# Patient Record
Sex: Female | Born: 1940 | Race: White | Hispanic: No | Smoking: Never smoker
Health system: Southern US, Community
[De-identification: ages and names within clinical notes are randomized; demographics above are authoritative.]

## PROBLEM LIST (undated history)

## (undated) DIAGNOSIS — F419 Anxiety disorder, unspecified: Secondary | ICD-10-CM

## (undated) DIAGNOSIS — C50919 Malignant neoplasm of unspecified site of unspecified female breast: Secondary | ICD-10-CM

## (undated) DIAGNOSIS — C50411 Malignant neoplasm of upper-outer quadrant of right female breast: Secondary | ICD-10-CM

## (undated) DIAGNOSIS — I10 Essential (primary) hypertension: Secondary | ICD-10-CM

## (undated) DIAGNOSIS — Z923 Personal history of irradiation: Secondary | ICD-10-CM

## (undated) DIAGNOSIS — E039 Hypothyroidism, unspecified: Secondary | ICD-10-CM

## (undated) DIAGNOSIS — IMO0001 Reserved for inherently not codable concepts without codable children: Secondary | ICD-10-CM

## (undated) DIAGNOSIS — Z8489 Family history of other specified conditions: Secondary | ICD-10-CM

## (undated) DIAGNOSIS — J45909 Unspecified asthma, uncomplicated: Secondary | ICD-10-CM

## (undated) DIAGNOSIS — IMO0002 Reserved for concepts with insufficient information to code with codable children: Secondary | ICD-10-CM

## (undated) DIAGNOSIS — M1990 Unspecified osteoarthritis, unspecified site: Secondary | ICD-10-CM

## (undated) DIAGNOSIS — T7840XA Allergy, unspecified, initial encounter: Secondary | ICD-10-CM

## (undated) HISTORY — PX: EYE SURGERY: SHX253

## (undated) HISTORY — DX: Malignant neoplasm of unspecified site of unspecified female breast: C50.919

## (undated) HISTORY — DX: Reserved for concepts with insufficient information to code with codable children: IMO0002

## (undated) HISTORY — DX: Reserved for inherently not codable concepts without codable children: IMO0001

## (undated) HISTORY — DX: Malignant neoplasm of upper-outer quadrant of right female breast: C50.411

---

## 1991-12-13 HISTORY — PX: BREAST EXCISIONAL BIOPSY: SUR124

## 2001-11-05 ENCOUNTER — Other Ambulatory Visit: Admission: RE | Admit: 2001-11-05 | Discharge: 2001-11-05 | Payer: Self-pay | Admitting: Obstetrics and Gynecology

## 2002-11-14 ENCOUNTER — Other Ambulatory Visit: Admission: RE | Admit: 2002-11-14 | Discharge: 2002-11-14 | Payer: Self-pay | Admitting: Obstetrics and Gynecology

## 2003-07-01 ENCOUNTER — Encounter: Admission: RE | Admit: 2003-07-01 | Discharge: 2003-07-01 | Payer: Self-pay | Admitting: Obstetrics and Gynecology

## 2003-07-01 ENCOUNTER — Encounter: Payer: Self-pay | Admitting: Obstetrics and Gynecology

## 2003-12-01 ENCOUNTER — Other Ambulatory Visit: Admission: RE | Admit: 2003-12-01 | Discharge: 2003-12-01 | Payer: Self-pay | Admitting: Obstetrics and Gynecology

## 2005-01-03 ENCOUNTER — Other Ambulatory Visit: Admission: RE | Admit: 2005-01-03 | Discharge: 2005-01-03 | Payer: Self-pay | Admitting: Obstetrics and Gynecology

## 2006-01-13 ENCOUNTER — Other Ambulatory Visit: Admission: RE | Admit: 2006-01-13 | Discharge: 2006-01-13 | Payer: Self-pay | Admitting: Obstetrics and Gynecology

## 2009-07-16 ENCOUNTER — Encounter: Admission: RE | Admit: 2009-07-16 | Discharge: 2009-07-16 | Payer: Self-pay | Admitting: Sports Medicine

## 2012-08-31 ENCOUNTER — Other Ambulatory Visit: Payer: Self-pay | Admitting: Urology

## 2012-09-10 ENCOUNTER — Encounter (HOSPITAL_COMMUNITY): Payer: Self-pay | Admitting: Pharmacy Technician

## 2012-09-13 ENCOUNTER — Ambulatory Visit (HOSPITAL_COMMUNITY)
Admission: RE | Admit: 2012-09-13 | Discharge: 2012-09-13 | Disposition: A | Discharge status: Home / Self Care | Payer: BC Managed Care – PPO | Source: Ambulatory Visit | Attending: Urology | Admitting: Urology

## 2012-09-13 ENCOUNTER — Encounter (HOSPITAL_COMMUNITY)
Admission: RE | Admit: 2012-09-13 | Discharge: 2012-09-13 | Disposition: A | Discharge status: Home / Self Care | Payer: BC Managed Care – PPO | Source: Ambulatory Visit | Attending: Urology | Admitting: Urology

## 2012-09-13 ENCOUNTER — Encounter (HOSPITAL_COMMUNITY): Payer: Self-pay

## 2012-09-13 DIAGNOSIS — Z01812 Encounter for preprocedural laboratory examination: Secondary | ICD-10-CM | POA: Insufficient documentation

## 2012-09-13 DIAGNOSIS — M1990 Unspecified osteoarthritis, unspecified site: Secondary | ICD-10-CM

## 2012-09-13 DIAGNOSIS — N8111 Cystocele, midline: Secondary | ICD-10-CM | POA: Insufficient documentation

## 2012-09-13 DIAGNOSIS — E039 Hypothyroidism, unspecified: Secondary | ICD-10-CM

## 2012-09-13 DIAGNOSIS — Z01818 Encounter for other preprocedural examination: Secondary | ICD-10-CM | POA: Insufficient documentation

## 2012-09-13 HISTORY — PX: KNEE ARTHROSCOPY: SUR90

## 2012-09-13 HISTORY — DX: Unspecified osteoarthritis, unspecified site: M19.90

## 2012-09-13 HISTORY — DX: Hypothyroidism, unspecified: E03.9

## 2012-09-13 HISTORY — PX: APPENDECTOMY: SHX54

## 2012-09-13 HISTORY — PX: TUBAL LIGATION: SHX77

## 2012-09-13 HISTORY — DX: Essential (primary) hypertension: I10

## 2012-09-13 HISTORY — PX: ABDOMINAL HYSTERECTOMY: SHX81

## 2012-09-13 HISTORY — PX: BREAST SURGERY: SHX581

## 2012-09-13 LAB — CBC
MCH: 30.5 pg (ref 26.0–34.0)
MCV: 94.6 fL (ref 78.0–100.0)
Platelets: 303 10*3/uL (ref 150–400)
RDW: 13.8 % (ref 11.5–15.5)

## 2012-09-13 LAB — PROTIME-INR: INR: 0.99 (ref 0.00–1.49)

## 2012-09-13 LAB — APTT: aPTT: 39 seconds — ABNORMAL HIGH (ref 24–37)

## 2012-09-13 LAB — BASIC METABOLIC PANEL
CO2: 32 mEq/L (ref 19–32)
Calcium: 9.6 mg/dL (ref 8.4–10.5)
Creatinine, Ser: 0.55 mg/dL (ref 0.50–1.10)
Glucose, Bld: 98 mg/dL (ref 70–99)

## 2012-09-13 NOTE — Patient Instructions (Addendum)
20 Debra Kelly  09/13/2012   Your procedure is scheduled on: 10-8  -2013  Report to [facility] at     0530   AM.             Call this number if you have problems the morning of surgery: [phone]  Or Presurgical Testing [phone](Annaliesa Blann)   Remember:  Follow any bowel prep instructions per Dr. Mina Marble office.    Do not eat food:After Midnight.    Take these medicines the morning of surgery with A SIP OF WATER: Synthroid, Tramadol   Do not wear jewelry, make-up or nail polish.  Do not wear lotions, powders, or perfumes. You may wear deodorant.  Do not shave 48 hours prior to surgery.(face and neck okay, no shaving of legs)  Do not bring valuables to the hospital.  Contacts, dentures or bridgework may not be worn into surgery.  Leave suitcase in the car. After surgery it may be brought to your room.  For patients admitted to the hospital, checkout time is 11:00 AM the day of discharge.   Patients discharged the day of surgery will not be allowed to drive home. Must have responsible person with you x 24 hours once discharged.  Name and phone number of your driver: Kennon Portela [phone] pt's cell  Special Instructions: CHG Shower Use Special Wash:  See special instructions.(avoid face and genitals)   Please read over the following fact sheets that you were given: MRSA Information, Blood Transfusion fact sheet, Incentive Spirometry Instruction.

## 2012-09-17 NOTE — H&P (Signed)
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 Vincente Poli. I read Dr. Lynnell Dike notes.  She said things are basically the same. She would really like to proceed with surgery. She remembered our lengthy discussions.  I drew her another picture and briefly went over success rate and risk. I went over a sling in quite a bit of detail and we both agreed with her tendency not to empty well that we are not going to proceed with a sling. She says her current stress incontinence is so mild she definitely could live with it and she understands that in some cases it can worsen. I thought that was a good choice for her last time as well.  Review of systems: No change in bowel or neurologic status.   Urinalysis: I reviewed, negative.    Past Medical History Problems  1. History of  Arthritis V13.4 2. History of  Hypertension 401.9  Surgical History Problems  1. History of  Appendectomy 2. History of  Breast Surgery Lumpectomy 3. History of  Hysterectomy V45.77 4. History of  Knee Surgery 5. History of  Ovarian Cystectomy  Current Meds 1. Levothyroxine Sodium 150 MCG Oral Tablet; Therapy: (Recorded:15Mar2013) to 2. Lisinopril 10 MG Oral Tablet; Therapy: (Recorded:15Mar2013) to 3. Meloxicam 15 MG Oral Tablet; Therapy: (Recorded:15Mar2013) to 4. Premarin 0.3 MG Oral Tablet; Therapy: (Recorded:15Mar2013) to 5. TraMADol HCl 50 MG Oral Tablet; Therapy: (Recorded:15Mar2013) to 6. Zolpidem Tartrate 10 MG Oral Tablet; Therapy: (Recorded:15Mar2013) to  Allergies Medication  1. CeleBREX CAPS 2. Codeine Derivatives  Family History Problems  1. Maternal history of  Asthma V17.5 2. Maternal history of  Chronic Obstructive Pulmonary Disease 3. Maternal history of  Heart Disease V17.49 4. Fraternal history of  Prostate Cancer V16.42 5. Fraternal history of  Prostate Cancer V16.42  Social History Problems  1. Caffeine Use 2. Marital History -  Single 3. 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 History of  Tobacco Use  Results/Data  Urine [Data Includes: Last 1 Day]   09Sep2013  COLOR YELLOW   APPEARANCE CLEAR   SPECIFIC GRAVITY >1.030   pH 6.0   GLUCOSE NEG mg/dL  BILIRUBIN NEG   KETONE NEG mg/dL  BLOOD NEG   PROTEIN NEG mg/dL  UROBILINOGEN 0.2 mg/dL  NITRITE NEG   LEUKOCYTE ESTERASE NEG    Assessment Assessed  1. 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 Cystocele 596.89  Plan   Discussion/Summary   Ms. Sauls would like to proceed with a transvaginal vault suspension, cystocele repair and graft. Hopefully I can reach her treatment goal. I am going to send a copy of my note to Dr. Marcelle Overlie to keep her updated on her treatment course.  After a thorough review of the management options for the patient's condition the patient  elected to proceed with surgical therapy as noted above. We have discussed the potential benefits and risks of the procedure, side effects of the proposed treatment, the likelihood of the patient achieving the goals of the procedure, and any potential problems that might occur during the procedure or recuperation. Informed consent has been obtained.

## 2012-09-17 NOTE — Anesthesia Preprocedure Evaluation (Addendum)
Anesthesia Evaluation  Patient identified by MRN, date of birth, ID band Patient awake    Reviewed: Allergy & Precautions, H&P , NPO status , Patient's Chart, lab work & pertinent test results  Airway Mallampati: III TM Distance: >3 FB Neck ROM: Full    Dental  (+) Teeth Intact, Dental Advisory Given and Caps,    Pulmonary neg pulmonary ROS,  breath sounds clear to auscultation  Pulmonary exam normal       Cardiovascular hypertension, Pt. on medications Rhythm:Regular Rate:Normal     Neuro/Psych negative neurological ROS  negative psych ROS   GI/Hepatic negative GI ROS, Neg liver ROS,   Endo/Other  Hypothyroidism Morbid obesity  Renal/GU negative Renal ROS  negative genitourinary   Musculoskeletal negative musculoskeletal ROS (+)   Abdominal   Peds negative pediatric ROS (+)  Hematology negative hematology ROS (+)   Anesthesia Other Findings   Reproductive/Obstetrics negative OB ROS                          Anesthesia Physical Anesthesia Plan  ASA: II  Anesthesia Plan: General   Post-op Pain Management:    Induction: Intravenous  Airway Management Planned: Oral ETT  Additional Equipment:   Intra-op Plan:   Post-operative Plan: Extubation in OR  Informed Consent: I have reviewed the patients History and Physical, chart, labs and discussed the procedure including the risks, benefits and alternatives for the proposed anesthesia with the patient or authorized representative who has indicated his/her understanding and acceptance.   Dental advisory given  Plan Discussed with: CRNA  Anesthesia Plan Comments:         Anesthesia Quick Evaluation

## 2012-09-18 ENCOUNTER — Encounter (HOSPITAL_COMMUNITY): Payer: Self-pay | Admitting: Anesthesiology

## 2012-09-18 ENCOUNTER — Ambulatory Visit (HOSPITAL_COMMUNITY): Payer: BC Managed Care – PPO | Admitting: Anesthesiology

## 2012-09-18 ENCOUNTER — Encounter (HOSPITAL_COMMUNITY)
Admission: RE | Disposition: A | Discharge status: Home / Self Care | Payer: Self-pay | Source: Ambulatory Visit | Attending: Urology

## 2012-09-18 ENCOUNTER — Ambulatory Visit (HOSPITAL_COMMUNITY)
Admission: RE | Admit: 2012-09-18 | Discharge: 2012-09-19 | Disposition: A | Discharge status: Home / Self Care | Payer: BC Managed Care – PPO | Source: Ambulatory Visit | Attending: Urology | Admitting: Urology

## 2012-09-18 ENCOUNTER — Encounter (HOSPITAL_COMMUNITY): Payer: Self-pay

## 2012-09-18 DIAGNOSIS — N8110 Cystocele, unspecified: Secondary | ICD-10-CM | POA: Insufficient documentation

## 2012-09-18 DIAGNOSIS — I10 Essential (primary) hypertension: Secondary | ICD-10-CM | POA: Insufficient documentation

## 2012-09-18 DIAGNOSIS — N8111 Cystocele, midline: Secondary | ICD-10-CM | POA: Insufficient documentation

## 2012-09-18 DIAGNOSIS — Z79899 Other long term (current) drug therapy: Secondary | ICD-10-CM | POA: Insufficient documentation

## 2012-09-18 DIAGNOSIS — N393 Stress incontinence (female) (male): Secondary | ICD-10-CM | POA: Insufficient documentation

## 2012-09-18 HISTORY — PX: VAGINAL PROLAPSE REPAIR: SHX830

## 2012-09-18 HISTORY — PX: CYSTOSCOPY: SHX5120

## 2012-09-18 HISTORY — PX: CYSTOCELE REPAIR: SHX163

## 2012-09-18 LAB — HEMOGLOBIN AND HEMATOCRIT, BLOOD
HCT: 35.1 % — ABNORMAL LOW (ref 36.0–46.0)
Hemoglobin: 11.2 g/dL — ABNORMAL LOW (ref 12.0–15.0)

## 2012-09-18 LAB — ABO/RH: ABO/RH(D): A NEG

## 2012-09-18 SURGERY — COLPORRHAPHY, ANTERIOR, FOR CYSTOCELE REPAIR
Anesthesia: General | Wound class: Clean Contaminated

## 2012-09-18 MED ORDER — LIDOCAINE HCL (CARDIAC) 20 MG/ML IV SOLN
INTRAVENOUS | Status: DC | PRN
  Administered 2012-09-18: 200 mg via INTRAVENOUS

## 2012-09-18 MED ORDER — HYDROMORPHONE HCL PF 1 MG/ML IJ SOLN
INTRAMUSCULAR | Status: DC | PRN
  Administered 2012-09-18: 0.5 mg via INTRAVENOUS

## 2012-09-18 MED ORDER — PROMETHAZINE HCL 25 MG/ML IJ SOLN: 6.2500 mg | INTRAMUSCULAR | Status: DC | PRN

## 2012-09-18 MED ORDER — FENTANYL CITRATE 0.05 MG/ML IJ SOLN
INTRAMUSCULAR | Status: DC | PRN
  Administered 2012-09-18: 25 ug via INTRAVENOUS
  Administered 2012-09-18: 50 ug via INTRAVENOUS
  Administered 2012-09-18: 25 ug via INTRAVENOUS

## 2012-09-18 MED ORDER — ACETAMINOPHEN 10 MG/ML IV SOLN
1000.0000 mg | Freq: Four times a day (QID) | INTRAVENOUS | Status: AC
  Administered 2012-09-18 – 2012-09-19 (×4): 1000 mg via INTRAVENOUS
  Filled 2012-09-18 (×4): qty 100

## 2012-09-18 MED ORDER — ESTRADIOL 0.1 MG/GM VA CREA
TOPICAL_CREAM | VAGINAL | Status: DC | PRN
  Administered 2012-09-18: 2 via VAGINAL

## 2012-09-18 MED ORDER — GLYCOPYRROLATE 0.2 MG/ML IJ SOLN
INTRAMUSCULAR | Status: DC | PRN
  Administered 2012-09-18: 0.4 mg via INTRAVENOUS

## 2012-09-18 MED ORDER — INDIGOTINDISULFONATE SODIUM 8 MG/ML IJ SOLN
INTRAMUSCULAR | Status: AC
  Filled 2012-09-18: qty 10

## 2012-09-18 MED ORDER — ONDANSETRON HCL 4 MG/2ML IJ SOLN
INTRAMUSCULAR | Status: DC | PRN
  Administered 2012-09-18: 2 mg via INTRAVENOUS

## 2012-09-18 MED ORDER — NEOSTIGMINE METHYLSULFATE 1 MG/ML IJ SOLN
INTRAMUSCULAR | Status: DC | PRN
  Administered 2012-09-18: 3 mg via INTRAVENOUS

## 2012-09-18 MED ORDER — CISATRACURIUM BESYLATE (PF) 10 MG/5ML IV SOLN
INTRAVENOUS | Status: DC | PRN
  Administered 2012-09-18: 1 mg via INTRAVENOUS
  Administered 2012-09-18: 2 mg via INTRAVENOUS
  Administered 2012-09-18: 7 mg via INTRAVENOUS

## 2012-09-18 MED ORDER — LISINOPRIL 10 MG PO TABS
10.0000 mg | ORAL_TABLET | Freq: Every morning | ORAL | Status: DC
Start: 2012-09-18 — End: 2012-09-19
  Administered 2012-09-18 – 2012-09-19 (×2): 10 mg via ORAL
  Filled 2012-09-18 (×2): qty 1

## 2012-09-18 MED ORDER — LACTATED RINGERS IV SOLN
INTRAVENOUS | Status: DC | PRN
  Administered 2012-09-18 (×2): via INTRAVENOUS

## 2012-09-18 MED ORDER — ESTRADIOL 0.1 MG/GM VA CREA
TOPICAL_CREAM | VAGINAL | Status: AC
  Filled 2012-09-18: qty 85

## 2012-09-18 MED ORDER — HYDROMORPHONE HCL PF 1 MG/ML IJ SOLN: 0.2500 mg | INTRAMUSCULAR | Status: DC | PRN

## 2012-09-18 MED ORDER — LEVOTHYROXINE SODIUM 150 MCG PO TABS
150.0000 ug | ORAL_TABLET | Freq: Every day | ORAL | Status: DC
  Administered 2012-09-19: 150 ug via ORAL
  Filled 2012-09-18 (×2): qty 1

## 2012-09-18 MED ORDER — EPHEDRINE SULFATE 50 MG/ML IJ SOLN
INTRAMUSCULAR | Status: DC | PRN
  Administered 2012-09-18 (×2): 5 mg via INTRAVENOUS
  Administered 2012-09-18: 10 mg via INTRAVENOUS

## 2012-09-18 MED ORDER — ACETAMINOPHEN 10 MG/ML IV SOLN
INTRAVENOUS | Status: DC | PRN
  Administered 2012-09-18: 1000 mg via INTRAVENOUS

## 2012-09-18 MED ORDER — MORPHINE SULFATE 2 MG/ML IJ SOLN: 2.0000 mg | INTRAMUSCULAR | Status: DC | PRN

## 2012-09-18 MED ORDER — TRAMADOL HCL 50 MG PO TABS: 50.0000 mg | ORAL_TABLET | ORAL | Status: DC | PRN

## 2012-09-18 MED ORDER — STERILE WATER FOR IRRIGATION IR SOLN
Status: DC | PRN
  Administered 2012-09-18: 3000 mL

## 2012-09-18 MED ORDER — ACETAMINOPHEN 10 MG/ML IV SOLN
INTRAVENOUS | Status: AC
  Filled 2012-09-18: qty 100

## 2012-09-18 MED ORDER — SUCCINYLCHOLINE CHLORIDE 20 MG/ML IJ SOLN
INTRAMUSCULAR | Status: DC | PRN
  Administered 2012-09-18: 100 mg via INTRAVENOUS

## 2012-09-18 MED ORDER — GENTAMICIN SULFATE 40 MG/ML IJ SOLN
350.0000 mg | INTRAMUSCULAR | Status: AC
  Administered 2012-09-18: 350 mg via INTRAVENOUS
  Filled 2012-09-18: qty 8.75

## 2012-09-18 MED ORDER — ZOLPIDEM TARTRATE 5 MG PO TABS
5.0000 mg | ORAL_TABLET | Freq: Every evening | ORAL | Status: DC | PRN
  Administered 2012-09-18: 5 mg via ORAL
  Filled 2012-09-18: qty 1

## 2012-09-18 MED ORDER — SODIUM CHLORIDE 0.9 % IV SOLN
1.5000 g | INTRAVENOUS | Status: AC
  Administered 2012-09-18: 1.5 g via INTRAVENOUS

## 2012-09-18 MED ORDER — SODIUM CHLORIDE 0.9 % IV SOLN
INTRAVENOUS | Status: AC
  Filled 2012-09-18: qty 1.5

## 2012-09-18 MED ORDER — ESTROGENS CONJUGATED 0.3 MG PO TABS
0.3000 mg | ORAL_TABLET | Freq: Every day | ORAL | Status: DC
  Administered 2012-09-18 – 2012-09-19 (×2): 0.3 mg via ORAL
  Filled 2012-09-18 (×2): qty 1

## 2012-09-18 MED ORDER — LIDOCAINE-EPINEPHRINE (PF) 1 %-1:200000 IJ SOLN
INTRAMUSCULAR | Status: DC | PRN
  Administered 2012-09-18: 20 mL

## 2012-09-18 MED ORDER — LIDOCAINE-EPINEPHRINE (PF) 1 %-1:200000 IJ SOLN
INTRAMUSCULAR | Status: AC
  Filled 2012-09-18: qty 10

## 2012-09-18 MED ORDER — SODIUM CHLORIDE 0.9 % IR SOLN
Status: DC | PRN
  Administered 2012-09-18: 08:00:00

## 2012-09-18 MED ORDER — INDIGOTINDISULFONATE SODIUM 8 MG/ML IJ SOLN
INTRAMUSCULAR | Status: DC | PRN
  Administered 2012-09-18: 5 mL via INTRAVENOUS

## 2012-09-18 MED ORDER — LACTATED RINGERS IV SOLN: INTRAVENOUS | Status: DC

## 2012-09-18 MED ORDER — KCL IN DEXTROSE-NACL 10-5-0.45 MEQ/L-%-% IV SOLN
INTRAVENOUS | Status: AC
  Filled 2012-09-18: qty 1000

## 2012-09-18 MED ORDER — KCL IN DEXTROSE-NACL 10-5-0.45 MEQ/L-%-% IV SOLN
INTRAVENOUS | Status: DC
  Administered 2012-09-18: 15:00:00 via INTRAVENOUS
  Administered 2012-09-18: 1000 mL via INTRAVENOUS
  Administered 2012-09-18 – 2012-09-19 (×2): via INTRAVENOUS
  Filled 2012-09-18 (×5): qty 1000

## 2012-09-18 SURGICAL SUPPLY — 69 items
ADH SKN CLS APL DERMABOND .7 (GAUZE/BANDAGES/DRESSINGS)
BAG URINE DRAINAGE (UROLOGICAL SUPPLIES) ×2 IMPLANT
BAG URO CATCHER STRL LF (DRAPE) IMPLANT
BLADE HEX COATED 2.75 (ELECTRODE) IMPLANT
BLADE SURG 15 STRL LF DISP TIS (BLADE) ×1 IMPLANT
BLADE SURG 15 STRL SS (BLADE) ×2
BRIEF STRETCH FOR OB PAD LRG (UNDERPADS AND DIAPERS) ×2 IMPLANT
CANISTER SUCTION 2500CC (MISCELLANEOUS) IMPLANT
CATH FOLEY 2WAY SLVR  5CC 14FR (CATHETERS)
CATH FOLEY 2WAY SLVR  5CC 16FR (CATHETERS)
CATH FOLEY 2WAY SLVR 5CC 14FR (CATHETERS) IMPLANT
CATH FOLEY 2WAY SLVR 5CC 16FR (CATHETERS) IMPLANT
CLOTH BEACON ORANGE TIMEOUT ST (SAFETY) ×2 IMPLANT
COVER MAYO STAND STRL (DRAPES) ×2 IMPLANT
COVER SURGICAL LIGHT HANDLE (MISCELLANEOUS) ×2 IMPLANT
DECANTER SPIKE VIAL GLASS SM (MISCELLANEOUS) ×2 IMPLANT
DERMABOND ADVANCED (GAUZE/BANDAGES/DRESSINGS)
DERMABOND ADVANCED .7 DNX12 (GAUZE/BANDAGES/DRESSINGS) IMPLANT
DEVICE CAPIO SLIM SINGLE (INSTRUMENTS) ×2 IMPLANT
DRAIN PENROSE 18X1/4 LTX STRL (WOUND CARE) ×2 IMPLANT
DRAPE CAMERA CLOSED 9X96 (DRAPES) IMPLANT
DRAPE LG THREE QUARTER DISP (DRAPES) ×2 IMPLANT
GAUZE PACKING 2X5 YD STERILE (GAUZE/BANDAGES/DRESSINGS) ×2 IMPLANT
GAUZE SPONGE 4X4 16PLY XRAY LF (GAUZE/BANDAGES/DRESSINGS) ×6 IMPLANT
GLOVE BIOGEL M STRL SZ7.5 (GLOVE) ×12 IMPLANT
GOWN PREVENTION PLUS XLARGE (GOWN DISPOSABLE) ×2 IMPLANT
GOWN STRL REIN XL XLG (GOWN DISPOSABLE) ×6 IMPLANT
HEMOSTAT SNOW SURGICEL 2X4 (HEMOSTASIS) ×2 IMPLANT
HEMOSTAT SURGICEL 4X8 (HEMOSTASIS) ×2 IMPLANT
HOLDER FOLEY CATH W/STRAP (MISCELLANEOUS) ×2 IMPLANT
IV NS 1000ML (IV SOLUTION)
IV NS 1000ML BAXH (IV SOLUTION) IMPLANT
KIT BASIN OR (CUSTOM PROCEDURE TRAY) ×2 IMPLANT
NEEDLE HYPO 22GX1.5 SAFETY (NEEDLE) ×2 IMPLANT
NEEDLE INJECT RIGID (NEEDLE) IMPLANT
NEEDLE MAYO .5 CIRCLE (NEEDLE) ×2 IMPLANT
NEEDLE MAYO 6 CRC TAPER PT (NEEDLE) IMPLANT
NS IRRIG 1000ML POUR BTL (IV SOLUTION) ×2 IMPLANT
PACK CYSTO (CUSTOM PROCEDURE TRAY) ×2 IMPLANT
PACKING VAGINAL (PACKING) IMPLANT
PENCIL BUTTON HOLSTER BLD 10FT (ELECTRODE) ×2 IMPLANT
PLUG CATH AND CAP STER (CATHETERS) ×2 IMPLANT
RETRACTOR STAY HOOK 5MM (MISCELLANEOUS) ×2 IMPLANT
SHEET LAVH (DRAPES) ×2 IMPLANT
SURGIFLO W/THROMBIN 8M KIT (HEMOSTASIS) ×6 IMPLANT
SUT CAPIO ETHIBPND (SUTURE) ×4 IMPLANT
SUT CAPIO POLYGLYCOLIC (SUTURE) IMPLANT
SUT ETHIBOND 0 (SUTURE) ×4 IMPLANT
SUT MNCRL AB 4-0 PS2 18 (SUTURE) IMPLANT
SUT SILK 2 0 30  PSL (SUTURE)
SUT SILK 2 0 30 PSL (SUTURE) IMPLANT
SUT SILK 2 0 SH (SUTURE) IMPLANT
SUT VIC AB 0 CT1 27 (SUTURE)
SUT VIC AB 0 CT1 27XBRD ANTBC (SUTURE) IMPLANT
SUT VIC AB 2-0 CT1 27 (SUTURE) ×2
SUT VIC AB 2-0 CT1 27XBRD (SUTURE) ×1 IMPLANT
SUT VIC AB 2-0 SH 27 (SUTURE) ×4
SUT VIC AB 2-0 SH 27X BRD (SUTURE) ×2 IMPLANT
SUT VIC AB 3-0 PS2 18 (SUTURE)
SUT VIC AB 3-0 PS2 18XBRD (SUTURE) IMPLANT
SUT VIC AB 3-0 SH 27 (SUTURE) ×6
SUT VIC AB 3-0 SH 27XBRD (SUTURE) ×3 IMPLANT
SUT VIC AB 4-0 PS2 27 (SUTURE) IMPLANT
SUT VICRYL 0 UR6 27IN ABS (SUTURE) ×4 IMPLANT
SYRINGE 10CC LL (SYRINGE) ×2 IMPLANT
TISSUE REPAIR XENFORM 6X10CM (Tissue) ×2 IMPLANT
TUBING CONNECTING 10 (TUBING) ×2 IMPLANT
WATER STERILE IRR 1500ML POUR (IV SOLUTION) IMPLANT
YANKAUER SUCT BULB TIP 10FT TU (MISCELLANEOUS) ×2 IMPLANT

## 2012-09-18 NOTE — Interval H&P Note (Signed)
History and Physical Interval Note:  09/18/2012 7:17 AM  Debra Kelly  has presented today for surgery, with the diagnosis of Cystocele/Vault prolapse  The various methods of treatment have been discussed with the patient and family. After consideration of risks, benefits and other options for treatment, the patient has consented to  Procedure(s) (LRB) with comments: ANTERIOR REPAIR (CYSTOCELE) (N/A) - Cystocele/Vault Repair/Cystoscopy and Graft VAGINAL VAULT SUSPENSION (N/A) CYSTOSCOPY (N/A) as a surgical intervention .  The patient's history has been reviewed, patient examined, no change in status, stable for surgery.  I have reviewed the patient's chart and labs.  Questions were answered to the patient's satisfaction.     Trejan Buda A

## 2012-09-18 NOTE — Progress Notes (Signed)
Repeat HB OK Vitals good Very pleased

## 2012-09-18 NOTE — Op Note (Signed)
Preoperative diagnosis: Vault prolapse plus cystocele Postoperative diagnosis: Vault prolapse plus cystocele Surgery: Vault prolapse repair plus cystocele repair plus graft and cystoscopy Surgeon: Dr. Lorin Picket Serafina Topham Assistant: Lujean Rave  The patient has the above diagnoses and consented to the above procedure. Preoperative laboratory tests were normal. Preoperative antibiotics were given. Extra care was taken to minimize the risk of compartment syndrome and neuropathy and deep vein thrombosis with leg positioning.  She had a small grade 3 cystocele that just reached introitus with a trapdoor deformity and a moderate central defect. She had some folds of vaginal wall mucosa near the apex and the apex was marked with a 3-0 Vicryl suture. She had minimal posterior defect with the cuff supported  I instilled 20 cc of a lidocaine epinephrine mixture submucosally. I was surprised on the amount of bleeding she had from the injections. Using my Allis technique I made a T-shaped vaginal wall incision just proximal to the vaginal cuff and to the bladder neck. We asked anesthesia re ASA etc.   I sharply dissected the thickened anterior vaginal wall mucosa from the underlying pubocervical fascia to the white line bilaterally. I did a lot of sharp dissection at the apex since she did have some shortening of the anterior vaginal wall and I wanted to make certain that the repair went back far enough at the apex. Once again she had surprisingly a number of bleeders that were controlled easily with cautery.  I did a 2 layer anterior repair not imbricating the bladder neck with 2-0 Vicryl with my usual technique. I then took down my double-ring retractor to reduce the cuff and cystoscoped the patient. She had good reduction of the cystocele with excellent blue jets bilaterally and no distortion of the trigone or ureters. Throughout the case her urine output was modest  I finger dissected to a large initial  spine bilaterally and swept soft tissues medially. She had a little bit of a flat sacrospinous ligament. Using the slim Capio device I placed a 0 Ethibond suture 1 full finger breath medial to the initial spine bilaterally in a straight line between the spines. I was very pleased with the position of the sutures. She did have some bleeding on the right and I placed some Surgicel and Ray-Tec sponge in place for many minutes which helped greatly.  0 Vicryl on a UR 6 needle using my usual technique was placed in the pelvic sidewall near the level of the urethrovesical angle. The 10 x 6 dermal graft was prepared and cut in its normal shape  I passed the 2 Ethibond sutures through the graft and sewed the graft in place first on the left and then on her right side. Again I was not that surprisd that she staredt to bleed when I pushed the graft down to its sacrospinous ligament. The bleeding was moderate. The bleeding appeared to be quite deep near the level of the initial spine and pelvic floor and not the lateral sidewall. When I later checked I concluded it was still coming from that area. I asked anesthesia to double check availability of blood  Surgi--flow was applied deep and along the pelvic sidewall in the area of the bleeding followed by a moist sponge and pressure. 6 or 7 minutes later I repeated exactly this holding pressure for another 10 minutes or longer. She probably had approximately 20 minutes or longer of pressure along with 2 applications of Surgi-Flo. Following this I removed my pressure and packs and the bleeding  was present but much less. I sewed the graft in place with the 0 Vicryls. I then placed a third application of Surgi-Flo in the area of the presumed bleeding. I quickly trimmed an appropriate amount of anterior vaginal wall closed the anterior vaginal wall running 0 Vicryl and CT1 needle.  To Estrace packs were inserted firmly.  Throughout the case the patient's urine was clear but  modest in volume and likely less than 200 mL. Her heart rate ranged between 67 and 74 throughout the case and she was stable. She'll be followed carefully postoperatively. I felt the bleeding was very reasonable at the end of the case. Overall I was pleased with the surgery. She had two rectal examinations and there was no injury to rectum and sutures were clear from rectum.

## 2012-09-18 NOTE — Transfer of Care (Signed)
Immediate Anesthesia Transfer of Care Note  Patient: Debra Kelly  Procedure(s) Performed: Procedure(s) (LRB) with comments: ANTERIOR REPAIR (CYSTOCELE) (N/A) - Cystocele/Vault Repair/Cystoscopy and Graft VAGINAL VAULT SUSPENSION (N/A) CYSTOSCOPY (N/A)  Patient Location: PACU  Anesthesia Type: General  Level of Consciousness: awake, alert , oriented and patient cooperative  Airway & Oxygen Therapy: Patient Spontanous Breathing and Patient connected to face mask oxygen  Post-op Assessment: Report given to PACU RN, Post -op Vital signs reviewed and stable and Patient moving all extremities X 4  Post vital signs: Reviewed and stable  Complications: No apparent anesthesia complications

## 2012-09-18 NOTE — Anesthesia Postprocedure Evaluation (Signed)
Anesthesia Post Note  Patient: Debra Kelly  Procedure(s) Performed: Procedure(s) (LRB): ANTERIOR REPAIR (CYSTOCELE) (N/A) VAGINAL VAULT SUSPENSION (N/A) CYSTOSCOPY (N/A)  Anesthesia type: General  Patient location: PACU  Post pain: Pain level controlled  Post assessment: Post-op Vital signs reviewed  Last Vitals:  Filed Vitals:   09/18/12 1045  BP: 142/63  Pulse: 92  Temp:   Resp: 17    Post vital signs: Reviewed  Level of consciousness: sedated  Complications: No apparent anesthesia complications

## 2012-09-19 ENCOUNTER — Encounter (HOSPITAL_COMMUNITY): Payer: Self-pay | Admitting: Urology

## 2012-09-19 LAB — BASIC METABOLIC PANEL
BUN: 6 mg/dL (ref 6–23)
Creatinine, Ser: 0.62 mg/dL (ref 0.50–1.10)
GFR calc Af Amer: >90 mL/min (ref >90)
GFR calc non Af Amer: 89 mL/min — ABNORMAL LOW (ref >90)
Glucose, Bld: 115 mg/dL — ABNORMAL HIGH (ref 70–99)
Potassium: 3.6 mEq/L (ref 3.5–5.1)

## 2012-09-19 NOTE — Progress Notes (Signed)
Vitals normal Laboratory tests normal Patient alert and stable Pain minimal and well-controlled Post treatment course discussed in detail Followup discussed in detail See orders 

## 2013-07-30 ENCOUNTER — Other Ambulatory Visit: Payer: Self-pay | Admitting: Urology

## 2013-10-01 ENCOUNTER — Encounter (HOSPITAL_COMMUNITY): Payer: Self-pay | Admitting: Pharmacy Technician

## 2013-10-04 ENCOUNTER — Encounter (HOSPITAL_COMMUNITY): Payer: Self-pay

## 2013-10-04 ENCOUNTER — Ambulatory Visit (HOSPITAL_COMMUNITY)
Admission: RE | Admit: 2013-10-04 | Discharge: 2013-10-04 | Disposition: A | Discharge status: Home / Self Care | Payer: Medicare Other | Source: Ambulatory Visit | Attending: Urology | Admitting: Urology

## 2013-10-04 ENCOUNTER — Encounter (HOSPITAL_COMMUNITY)
Admission: RE | Admit: 2013-10-04 | Discharge: 2013-10-04 | Disposition: A | Discharge status: Home / Self Care | Payer: Medicare Other | Source: Ambulatory Visit | Attending: Urology | Admitting: Urology

## 2013-10-04 DIAGNOSIS — Z01810 Encounter for preprocedural cardiovascular examination: Secondary | ICD-10-CM | POA: Insufficient documentation

## 2013-10-04 DIAGNOSIS — Z01818 Encounter for other preprocedural examination: Secondary | ICD-10-CM | POA: Diagnosis not present

## 2013-10-04 DIAGNOSIS — I10 Essential (primary) hypertension: Secondary | ICD-10-CM | POA: Diagnosis not present

## 2013-10-04 DIAGNOSIS — N816 Rectocele: Secondary | ICD-10-CM | POA: Insufficient documentation

## 2013-10-04 DIAGNOSIS — Z01812 Encounter for preprocedural laboratory examination: Secondary | ICD-10-CM | POA: Diagnosis not present

## 2013-10-04 LAB — TYPE AND SCREEN: ABO/RH(D): A NEG

## 2013-10-04 LAB — CBC
HCT: 40.7 % (ref 36.0–46.0)
Hemoglobin: 12.9 g/dL (ref 12.0–15.0)
RBC: 4.3 MIL/uL (ref 3.87–5.11)
RDW: 14.1 % (ref 11.5–15.5)
WBC: 5.5 10*3/uL (ref 4.0–10.5)

## 2013-10-04 LAB — BASIC METABOLIC PANEL
Calcium: 9.6 mg/dL (ref 8.4–10.5)
Creatinine, Ser: 0.64 mg/dL (ref 0.50–1.10)
GFR calc non Af Amer: 88 mL/min — ABNORMAL LOW (ref >90)
Glucose, Bld: 94 mg/dL (ref 70–99)
Sodium: 138 mEq/L (ref 135–145)

## 2013-10-04 LAB — PROTIME-INR
INR: 1 (ref 0.00–1.49)
Prothrombin Time: 13 seconds (ref 11.6–15.2)

## 2013-10-04 LAB — APTT: aPTT: 38 seconds — ABNORMAL HIGH (ref 24–37)

## 2013-10-04 NOTE — Patient Instructions (Addendum)
20 Debra Kelly  10/04/2013   Your procedure is scheduled on: 11-4  -2014  Report to [facility] at       0515 AM ..  Call this number if you have problems the morning of surgery: [phone]  Or Presurgical Testing [phone](Jasaiah Karwowski)   Remember: Follow any bowel prep instructions per MD office.   Do not eat food:After Midnight.    Take these medicines the morning of surgery with A SIP OF WATER: Premarin. Levothyroxine. Check on Oxybutynin.   Do not wear jewelry, make-up or nail polish.  Do not wear lotions, powders, or perfumes. You may wear deodorant.  Do not shave 12 hours prior to first CHG shower(legs and under arms).(face and neck okay.)  Do not bring valuables to the hospital.  Contacts, dentures or removable bridgework, body piercing, hair pins may not be worn into surgery.  Leave suitcase in the car. After surgery it may be brought to your room.  For patients admitted to the hospital, checkout time is 11:00 AM the day of discharge.   Patients discharged the day of surgery will not be allowed to drive home. Must have responsible person with you x 24 hours once discharged.  Name and phone number of your driver: [place] [phone]-WG'N cell  Special Instructions: CHG(Chlorhedine 4%-"Hibiclens","Betasept","Aplicare") Shower Use Special Wash: see special instructions.(avoid face and genitals)   Please read over the following fact sheets that you were given: Blood Transfusion fact sheet, Incentive Spirometry Instruction.   Remember : Type/Screen "Blue armbands" - may not be removed once applied(would result in being retested if removed).   Failure to follow these instructions may result in Cancellation of your surgery.   Patient signature_______________________________________________________

## 2013-10-04 NOTE — Pre-Procedure Instructions (Signed)
10-04-13 EKG/CXR done today

## 2013-10-14 ENCOUNTER — Encounter (HOSPITAL_COMMUNITY): Payer: Self-pay | Admitting: Anesthesiology

## 2013-10-14 MED ORDER — GENTAMICIN SULFATE 40 MG/ML IJ SOLN
460.0000 mg | INTRAMUSCULAR | Status: DC
  Filled 2013-10-14: qty 11.5

## 2013-10-14 NOTE — Anesthesia Preprocedure Evaluation (Signed)
Anesthesia Evaluation  Patient identified by MRN, date of birth, ID band Patient awake    Reviewed: Allergy & Precautions, H&P , NPO status , Patient's Chart, lab work & pertinent test results  Airway Mallampati: II  TM Distance: >3 FB Neck ROM: Full    Dental no notable dental hx.    Pulmonary neg pulmonary ROS,  breath sounds clear to auscultation  Pulmonary exam normal       Cardiovascular hypertension, Pt. on medications negative cardio ROS  Rhythm:Regular Rate:Normal     Neuro/Psych negative neurological ROS  negative psych ROS   GI/Hepatic negative GI ROS, Neg liver ROS,   Endo/Other  Hypothyroidism   Renal/GU negative Renal ROS  negative genitourinary   Musculoskeletal negative musculoskeletal ROS (+)   Abdominal (+) + obese,   Peds negative pediatric ROS (+)  Hematology negative hematology ROS (+)   Anesthesia Other Findings   Reproductive/Obstetrics negative OB ROS                             Anesthesia Physical  Anesthesia Plan  ASA: II  Anesthesia Plan: General   Post-op Pain Management:    Induction: Intravenous  Airway Management Planned: Oral ETT  Additional Equipment:   Intra-op Plan:   Post-operative Plan: Extubation in OR  Informed Consent: I have reviewed the patients History and Physical, chart, labs and discussed the procedure including the risks, benefits and alternatives for the proposed anesthesia with the patient or authorized representative who has indicated his/her understanding and acceptance.   Dental advisory given  Plan Discussed with: CRNA  Anesthesia Plan Comments:         Anesthesia Quick Evaluation  

## 2013-10-14 NOTE — H&P (Signed)
History of Present Illness   Debra Kelly had a vault prolapse repair and cystocele repair and graft and was doing great. When I saw her in July, she felt vaginal bulging and she actually had developed a distal rectocele. I drew her a picture and I do not think she has an enterocele though I will look for it intraoperatively. Because of co-pay issues next year, she wanted to actually have a rectocele repair. She thinks the yeast infection is gone. She has mild stable frequency.   Review of Systems: No other change in bowel or neurologic systems.   Urinalysis: I reviewed, negative.   A picture drawn and I discussed watchful waiting. I did not discuss a pessary.  I drew her a picture and we talked about prolapse surgery in detail. Pros, cons, general surgical and anesthetic risks, and other options including behavioral therapy, pessaries, and watchful waiting were discussed. She understands that prolapse repairs are successful in 80-85% of cases for prolapse symptoms and can recur anteriorly, posteriorly, and/or apically. She understands that in most cases I use a graft and general risks were discussed. Surgical risks were described but not limited to the discussion of injury to neighboring structures including the bowel (with possible life-threatening sepsis and colostomy), bladder, urethra, vagina (all resulting in further surgery), and ureter (resulting in re-implantation). We talked about injury to nerves/soft tissue leading to debilitating and intractable pelvic, abdominal, and lower extremity pain syndromes and neuropathies. The risks of buttock pain, intractable dyspareunia, and vaginal narrowing and shortening with sequelae were discussed. Bleeding risks, transfusion rates, and infection were discussed. The risk of persistent, de novo, or worsening bladder and/or bowel incontinence/dysfunction was discussed. The need for CIC was described as well the usual post-operative course. The patient understands  that she might not reach her treatment goal and that she might be worse following surgery.    Past Medical History Problems  1. History of  Arthritis V13.4 2. History of  Hypertension 401.9  Surgical History Problems  1. History of  Anterior Colporrhaphy, Repair Of Cystocele 2. History of  Appendectomy 3. History of  Breast Surgery Lumpectomy 4. History of  Hysterectomy V45.77 5. History of  Knee Surgery 6. History of  Ovarian Cystectomy 7. History of  Sacrospinous Ligament Fixation For Posthysterectomy Prolapse 8. History of  Vaginal Surg Insertion Of Prosthesis For Pelvic Floor Repair  Current Meds 1. Fluconazole 100 MG Oral Tablet; Take 1 tablet daily; Therapy: 30Jul2014 to  (Evaluate:02Aug2014)  Requested for: 30Jul2014; Last Rx:30Jul2014 2. Levothyroxine Sodium 150 MCG Oral Tablet; Therapy: (Recorded:15Mar2013) to 3. Lisinopril 10 MG Oral Tablet; Therapy: (Recorded:15Mar2013) to 4. Meloxicam 15 MG Oral Tablet; Therapy: (Recorded:15Mar2013) to 5. Premarin 0.3 MG Oral Tablet; Therapy: (Recorded:15Mar2013) to 6. TraMADol HCl 50 MG Oral Tablet; Therapy: (Recorded:15Mar2013) to 7. Zolpidem Tartrate 5 MG Oral Tablet; Therapy: 22Apr2013 to  Allergies Medication  1. CeleBREX CAPS 2. Codeine Derivatives  Family History Problems  1. Maternal history of  Asthma V17.5 2. Maternal history of  Chronic Obstructive Pulmonary Disease 3. Maternal history of  Heart Disease V17.49 4. Fraternal history of  Prostate Cancer V16.42 5. Fraternal history of  Prostate Cancer V16.42  Social History Problems  1. Caffeine Use 2. Marital History - Single 3. 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 History of  Tobacco Use  Results/Data  Urine [Data Includes: Last 1 Day]   13Aug2014  COLOR YELLOW   APPEARANCE CLEAR   SPECIFIC GRAVITY 1.025   pH 5.5  GLUCOSE NEG mg/dL  BILIRUBIN NEG   KETONE NEG mg/dL  BLOOD NEG   PROTEIN NEG mg/dL  UROBILINOGEN 0.2 mg/dL   NITRITE NEG   LEUKOCYTE ESTERASE NEG    Assessment Assessed  1. Rectocele 596.89 2. Vaginitis 616.10  Plan   Discussion/Summary   [name] would like to proceed with a rectocele repair. She may or may not need a graft. I do not think she will need a vault suspension but I would do an iliococcygeus repair if needed. We will proceed accordingly and hopefully if she gets a long-term treatment ____. Again, recurrence rates were discussed.  After a thorough review of the management options for the patient's condition the patient  elected to proceed with surgical therapy as noted above. We have discussed the potential benefits and risks of the procedure, side effects of the proposed treatment, the likelihood of the patient achieving the goals of the procedure, and any potential problems that might occur during the procedure or recuperation. Informed consent has been obtained.

## 2013-10-15 ENCOUNTER — Encounter (HOSPITAL_COMMUNITY): Payer: Medicare Other | Admitting: Anesthesiology

## 2013-10-15 ENCOUNTER — Observation Stay (HOSPITAL_COMMUNITY)
Admission: RE | Admit: 2013-10-15 | Discharge: 2013-10-16 | Disposition: A | Discharge status: Home / Self Care | Payer: Medicare Other | Source: Ambulatory Visit | Attending: Urology | Admitting: Urology

## 2013-10-15 ENCOUNTER — Encounter (HOSPITAL_COMMUNITY): Payer: Self-pay | Admitting: *Deleted

## 2013-10-15 ENCOUNTER — Encounter (HOSPITAL_COMMUNITY)
Admission: RE | Disposition: A | Discharge status: Home / Self Care | Payer: Self-pay | Source: Ambulatory Visit | Attending: Urology

## 2013-10-15 ENCOUNTER — Ambulatory Visit (HOSPITAL_COMMUNITY): Payer: Medicare Other | Admitting: Anesthesiology

## 2013-10-15 DIAGNOSIS — N993 Prolapse of vaginal vault after hysterectomy: Principal | ICD-10-CM | POA: Insufficient documentation

## 2013-10-15 DIAGNOSIS — N760 Acute vaginitis: Secondary | ICD-10-CM | POA: Insufficient documentation

## 2013-10-15 DIAGNOSIS — I10 Essential (primary) hypertension: Secondary | ICD-10-CM | POA: Insufficient documentation

## 2013-10-15 DIAGNOSIS — E669 Obesity, unspecified: Secondary | ICD-10-CM | POA: Insufficient documentation

## 2013-10-15 DIAGNOSIS — Z79899 Other long term (current) drug therapy: Secondary | ICD-10-CM | POA: Insufficient documentation

## 2013-10-15 DIAGNOSIS — E039 Hypothyroidism, unspecified: Secondary | ICD-10-CM | POA: Insufficient documentation

## 2013-10-15 HISTORY — PX: RECTOCELE REPAIR: SHX761

## 2013-10-15 SURGERY — COLPORRHAPHY, POSTERIOR, FOR RECTOCELE REPAIR
Anesthesia: General | Wound class: Clean Contaminated

## 2013-10-15 MED ORDER — PROPOFOL 10 MG/ML IV BOLUS
INTRAVENOUS | Status: DC | PRN
  Administered 2013-10-15: 180 mg via INTRAVENOUS
  Administered 2013-10-15: 50 mg via INTRAVENOUS

## 2013-10-15 MED ORDER — FENTANYL CITRATE 0.05 MG/ML IJ SOLN
INTRAMUSCULAR | Status: DC | PRN
  Administered 2013-10-15: 50 ug via INTRAVENOUS
  Administered 2013-10-15: 200 ug via INTRAVENOUS

## 2013-10-15 MED ORDER — CISATRACURIUM BESYLATE (PF) 10 MG/5ML IV SOLN
INTRAVENOUS | Status: DC | PRN
  Administered 2013-10-15 (×2): 2 mg via INTRAVENOUS
  Administered 2013-10-15: 4 mg via INTRAVENOUS
  Administered 2013-10-15: 6 mg via INTRAVENOUS
  Administered 2013-10-15: 2 mg via INTRAVENOUS

## 2013-10-15 MED ORDER — METHYLENE BLUE 1 % INJ SOLN
INTRAMUSCULAR | Status: AC
  Filled 2013-10-15: qty 10

## 2013-10-15 MED ORDER — PROMETHAZINE HCL 25 MG/ML IJ SOLN: 6.2500 mg | INTRAMUSCULAR | Status: DC | PRN

## 2013-10-15 MED ORDER — LIDOCAINE-EPINEPHRINE (PF) 1 %-1:200000 IJ SOLN
INTRAMUSCULAR | Status: DC | PRN
  Administered 2013-10-15: 18 mL

## 2013-10-15 MED ORDER — ONDANSETRON HCL 4 MG/2ML IJ SOLN
INTRAMUSCULAR | Status: DC | PRN
  Administered 2013-10-15 (×2): 2 mg via INTRAVENOUS

## 2013-10-15 MED ORDER — NEOSTIGMINE METHYLSULFATE 1 MG/ML IJ SOLN
INTRAMUSCULAR | Status: DC | PRN
  Administered 2013-10-15: 5 mg via INTRAVENOUS

## 2013-10-15 MED ORDER — FLEET ENEMA 7-19 GM/118ML RE ENEM: 1.0000 | ENEMA | Freq: Once | RECTAL | Status: DC

## 2013-10-15 MED ORDER — STERILE WATER FOR IRRIGATION IR SOLN
Status: DC | PRN
  Administered 2013-10-15: 3000 mL

## 2013-10-15 MED ORDER — FENTANYL CITRATE 0.05 MG/ML IJ SOLN: 25.0000 ug | INTRAMUSCULAR | Status: DC | PRN

## 2013-10-15 MED ORDER — LIDOCAINE-EPINEPHRINE 1 %-1:100000 IJ SOLN
INTRAMUSCULAR | Status: AC
  Filled 2013-10-15: qty 1

## 2013-10-15 MED ORDER — SODIUM CHLORIDE 0.9 % IR SOLN
Status: DC | PRN
  Administered 2013-10-15: 08:00:00

## 2013-10-15 MED ORDER — SUCCINYLCHOLINE CHLORIDE 20 MG/ML IJ SOLN
INTRAMUSCULAR | Status: DC | PRN
  Administered 2013-10-15: 100 mg via INTRAVENOUS

## 2013-10-15 MED ORDER — ESTROGENS CONJUGATED 0.3 MG PO TABS
0.3000 mg | ORAL_TABLET | Freq: Every day | ORAL | Status: DC
  Administered 2013-10-16: 0.3 mg via ORAL
  Filled 2013-10-15: qty 1

## 2013-10-15 MED ORDER — DEXAMETHASONE SODIUM PHOSPHATE 10 MG/ML IJ SOLN
INTRAMUSCULAR | Status: DC | PRN
  Administered 2013-10-15: 10 mg via INTRAVENOUS

## 2013-10-15 MED ORDER — SODIUM CHLORIDE 0.9 % IV SOLN
1.5000 g | INTRAVENOUS | Status: AC
  Administered 2013-10-15: 1.5 g via INTRAVENOUS

## 2013-10-15 MED ORDER — SODIUM CHLORIDE 0.9 % IV SOLN
INTRAVENOUS | Status: AC
  Filled 2013-10-15: qty 1.5

## 2013-10-15 MED ORDER — LISINOPRIL 10 MG PO TABS
10.0000 mg | ORAL_TABLET | Freq: Every morning | ORAL | Status: DC
Start: 2013-10-15 — End: 2013-10-16
  Administered 2013-10-15 – 2013-10-16 (×2): 10 mg via ORAL
  Filled 2013-10-15 (×2): qty 1

## 2013-10-15 MED ORDER — LEVOTHYROXINE SODIUM 150 MCG PO TABS
150.0000 ug | ORAL_TABLET | Freq: Every day | ORAL | Status: DC
  Administered 2013-10-16: 08:00:00 150 ug via ORAL
  Filled 2013-10-15 (×2): qty 1

## 2013-10-15 MED ORDER — ESTRADIOL 0.1 MG/GM VA CREA
TOPICAL_CREAM | VAGINAL | Status: AC
  Filled 2013-10-15: qty 85

## 2013-10-15 MED ORDER — HYDROCODONE-ACETAMINOPHEN 5-325 MG PO TABS: 1.0000 | ORAL_TABLET | ORAL | Status: DC | PRN

## 2013-10-15 MED ORDER — CIPROFLOXACIN HCL 250 MG PO TABS: 250.0000 mg | ORAL_TABLET | Freq: Two times a day (BID) | ORAL | Status: DC

## 2013-10-15 MED ORDER — ZOLPIDEM TARTRATE 5 MG PO TABS
5.0000 mg | ORAL_TABLET | Freq: Every evening | ORAL | Status: DC | PRN
  Administered 2013-10-15: 5 mg via ORAL
  Filled 2013-10-15: qty 1

## 2013-10-15 MED ORDER — DIPHENHYDRAMINE HCL 50 MG/ML IJ SOLN: 12.5000 mg | Freq: Four times a day (QID) | INTRAMUSCULAR | Status: DC | PRN

## 2013-10-15 MED ORDER — ESTRADIOL 0.1 MG/GM VA CREA
TOPICAL_CREAM | VAGINAL | Status: DC | PRN
  Administered 2013-10-15: 1 via VAGINAL

## 2013-10-15 MED ORDER — LACTATED RINGERS IV SOLN
INTRAVENOUS | Status: DC | PRN
  Administered 2013-10-15 (×3): via INTRAVENOUS

## 2013-10-15 MED ORDER — GLYCOPYRROLATE 0.2 MG/ML IJ SOLN
INTRAMUSCULAR | Status: DC | PRN
  Administered 2013-10-15: .8 mg via INTRAVENOUS

## 2013-10-15 MED ORDER — EPHEDRINE SULFATE 50 MG/ML IJ SOLN
INTRAMUSCULAR | Status: DC | PRN
  Administered 2013-10-15 (×3): 5 mg via INTRAVENOUS

## 2013-10-15 MED ORDER — ACETAMINOPHEN 325 MG PO TABS
650.0000 mg | ORAL_TABLET | ORAL | Status: DC | PRN
  Administered 2013-10-15: 650 mg via ORAL
  Filled 2013-10-15: qty 2

## 2013-10-15 MED ORDER — TRAMADOL HCL 50 MG PO TABS: 50.0000 mg | ORAL_TABLET | Freq: Four times a day (QID) | ORAL | Status: DC | PRN

## 2013-10-15 MED ORDER — MIDAZOLAM HCL 5 MG/5ML IJ SOLN
INTRAMUSCULAR | Status: DC | PRN
  Administered 2013-10-15 (×2): .5 mg via INTRAVENOUS

## 2013-10-15 MED ORDER — GENTAMICIN SULFATE 40 MG/ML IJ SOLN
460.0000 mg | INTRAVENOUS | Status: AC
  Administered 2013-10-15: 460 mg via INTRAVENOUS
  Filled 2013-10-15: qty 11.5

## 2013-10-15 MED ORDER — DEXTROSE-NACL 5-0.45 % IV SOLN
INTRAVENOUS | Status: DC
  Administered 2013-10-15 – 2013-10-16 (×2): via INTRAVENOUS

## 2013-10-15 MED ORDER — LIDOCAINE HCL (CARDIAC) 20 MG/ML IV SOLN
INTRAVENOUS | Status: DC | PRN
  Administered 2013-10-15: 75 mg via INTRAVENOUS

## 2013-10-15 MED ORDER — PHENYLEPHRINE HCL 10 MG/ML IJ SOLN
INTRAMUSCULAR | Status: DC | PRN
  Administered 2013-10-15 (×3): 20 ug via INTRAVENOUS
  Administered 2013-10-15: 40 ug via INTRAVENOUS

## 2013-10-15 MED ORDER — MORPHINE SULFATE 2 MG/ML IJ SOLN: 2.0000 mg | INTRAMUSCULAR | Status: DC | PRN

## 2013-10-15 MED ORDER — DIPHENHYDRAMINE HCL 12.5 MG/5ML PO ELIX: 12.5000 mg | ORAL_SOLUTION | Freq: Four times a day (QID) | ORAL | Status: DC | PRN

## 2013-10-15 SURGICAL SUPPLY — 54 items
BAG URINE DRAINAGE (UROLOGICAL SUPPLIES) ×2 IMPLANT
BLADE HEX COATED 2.75 (ELECTRODE) ×2 IMPLANT
BLADE SURG 15 STRL LF DISP TIS (BLADE) ×1 IMPLANT
BLADE SURG 15 STRL SS (BLADE) ×2
CANISTER SUCTION 2500CC (MISCELLANEOUS) IMPLANT
CATH FOLEY 2WAY SLVR  5CC 14FR (CATHETERS) ×1
CATH FOLEY 2WAY SLVR  5CC 16FR (CATHETERS)
CATH FOLEY 2WAY SLVR 5CC 14FR (CATHETERS) ×1 IMPLANT
CATH FOLEY 2WAY SLVR 5CC 16FR (CATHETERS) IMPLANT
CLOTH BEACON ORANGE TIMEOUT ST (SAFETY) ×2 IMPLANT
COVER MAYO STAND STRL (DRAPES) ×2 IMPLANT
COVER SURGICAL LIGHT HANDLE (MISCELLANEOUS) ×2 IMPLANT
DEVICE CAPIO SUTURING (INSTRUMENTS) ×1
DEVICE CAPIO SUTURING OPC (INSTRUMENTS) ×1 IMPLANT
DRAIN PENROSE 18X1/4 LTX STRL (WOUND CARE) ×2 IMPLANT
GAUZE PACKING 2X5 YD STERILE (GAUZE/BANDAGES/DRESSINGS) ×2 IMPLANT
GAUZE SPONGE 4X4 16PLY XRAY LF (GAUZE/BANDAGES/DRESSINGS) ×4 IMPLANT
GLOVE BIOGEL M STRL SZ7.5 (GLOVE) ×2 IMPLANT
GOWN STRL REIN XL XLG (GOWN DISPOSABLE) ×2 IMPLANT
HOLDER FOLEY CATH W/STRAP (MISCELLANEOUS) ×2 IMPLANT
IV NS 1000ML (IV SOLUTION)
IV NS 1000ML BAXH (IV SOLUTION) IMPLANT
KIT BASIN OR (CUSTOM PROCEDURE TRAY) ×2 IMPLANT
MANIFOLD NEPTUNE II (INSTRUMENTS) ×2 IMPLANT
NEEDLE HYPO 22GX1.5 SAFETY (NEEDLE) ×2 IMPLANT
NEEDLE MAYO 6 CRC TAPER PT (NEEDLE) ×2 IMPLANT
NS IRRIG 1000ML POUR BTL (IV SOLUTION) IMPLANT
PACK CYSTO (CUSTOM PROCEDURE TRAY) ×2 IMPLANT
PENCIL BUTTON HOLSTER BLD 10FT (ELECTRODE) ×2 IMPLANT
PLUG CATH AND CAP STER (CATHETERS) ×2 IMPLANT
RETRACTOR STAY HOOK 5MM (MISCELLANEOUS) ×2 IMPLANT
SHEET LAVH (DRAPES) ×2 IMPLANT
SUT CAPIO ETHIBPND (SUTURE) IMPLANT
SUT CAPIO POLYGLYCOLIC (SUTURE) ×4 IMPLANT
SUT SILK 2 0 30  PSL (SUTURE)
SUT SILK 2 0 30 PSL (SUTURE) IMPLANT
SUT VIC AB 0 CT1 27 (SUTURE) ×4
SUT VIC AB 0 CT1 27XBRD ANTBC (SUTURE) ×2 IMPLANT
SUT VIC AB 2-0 CT1 27 (SUTURE) ×2
SUT VIC AB 2-0 CT1 27XBRD (SUTURE) ×1 IMPLANT
SUT VIC AB 2-0 SH 27 (SUTURE) ×14
SUT VIC AB 2-0 SH 27X BRD (SUTURE) ×7 IMPLANT
SUT VIC AB 3-0 PS2 18 (SUTURE)
SUT VIC AB 3-0 PS2 18XBRD (SUTURE) IMPLANT
SUT VIC AB 3-0 SH 27 (SUTURE) ×6
SUT VIC AB 3-0 SH 27XBRD (SUTURE) ×3 IMPLANT
SUT VICRYL 0 UR6 27IN ABS (SUTURE) ×8 IMPLANT
SYRINGE 10CC LL (SYRINGE) ×2 IMPLANT
TISSUE REPAIR XENFORM 4X7CM (Tissue) ×2 IMPLANT
TOWEL OR 17X26 10 PK STRL BLUE (TOWEL DISPOSABLE) ×2 IMPLANT
TOWEL OR NON WOVEN STRL DISP B (DISPOSABLE) ×2 IMPLANT
TUBING CONNECTING 10 (TUBING) ×2 IMPLANT
WATER STERILE IRR 1500ML POUR (IV SOLUTION) IMPLANT
YANKAUER SUCT BULB TIP 10FT TU (MISCELLANEOUS) ×2 IMPLANT

## 2013-10-15 NOTE — Interval H&P Note (Signed)
History and Physical Interval Note:  10/15/2013 7:03 AM  Debra Kelly  has presented today for surgery, with the diagnosis of RECTOCELE   The various methods of treatment have been discussed with the patient and family. After consideration of risks, benefits and other options for treatment, the patient has consented to  Procedure(s): RECTOCELE REPAIR WITH GRAFT  (N/A) CYSTOSCOPY (N/A) as a surgical intervention .  The patient's history has been reviewed, patient examined, no change in status, stable for surgery.  I have reviewed the patient's chart and labs.  Questions were answered to the patient's satisfaction.     Hoyte Ziebell A

## 2013-10-15 NOTE — Anesthesia Procedure Notes (Signed)
Procedure Name: Intubation Date/Time: 10/15/2013 7:28 AM Performed by: Edison Pace Pre-anesthesia Checklist: Patient identified, Timeout performed, Emergency Drugs available, Suction available and Patient being monitored Patient Re-evaluated:Patient Re-evaluated prior to inductionOxygen Delivery Method: Circle system utilized Preoxygenation: Pre-oxygenation with 100% oxygen Intubation Type: IV induction Ventilation: Mask ventilation without difficulty Laryngoscope Size: Mac and 4 Grade View: Grade III Tube type: Oral Tube size: 7.5 mm Number of attempts: 1 Airway Equipment and Method: Stylet Placement Confirmation: positive ETCO2,  ETT inserted through vocal cords under direct vision and breath sounds checked- equal and bilateral Secured at: 21 cm Tube secured with: Tape Dental Injury: Teeth and Oropharynx as per pre-operative assessment  Difficulty Due To: Difficulty was anticipated, Difficult Airway- due to large tongue, Difficult Airway- due to reduced neck mobility, Difficult Airway- due to dentition, Difficult Airway- due to limited oral opening and Difficult Airway- due to anterior larynx Future Recommendations: Recommend- induction with short-acting agent, and alternative techniques readily available

## 2013-10-15 NOTE — Anesthesia Postprocedure Evaluation (Signed)
  Anesthesia Post-op Note  Patient: Debra Kelly  Procedure(s) Performed: Procedure(s) (LRB): RECTOCELE REPAIR WITH GRAFT  ANAD VAULT SUSPINSION (N/A)  Patient Location: PACU  Anesthesia Type: General  Level of Consciousness: awake and alert   Airway and Oxygen Therapy: Patient Spontanous Breathing  Post-op Pain: mild  Post-op Assessment: Post-op Vital signs reviewed, Patient's Cardiovascular Status Stable, Respiratory Function Stable, Patent Airway and No signs of Nausea or vomiting  Last Vitals:  Filed Vitals:   10/15/13 1116  BP: 122/69  Pulse: 79  Temp: 36.4 C  Resp: 16    Post-op Vital Signs: stable   Complications: No apparent anesthesia complications

## 2013-10-15 NOTE — Preoperative (Signed)
Beta Blockers   Reason not to administer Beta Blockers:Not Applicable 

## 2013-10-15 NOTE — Progress Notes (Signed)
Pt arrived from PACU to room 1414 via stretcher, A&Ox4. Slid self to bed. Peripad w/ bloody drainage. Denies pain/nausea. SCDs in place. Foley draining cl/y urine. Pt and SO oriented to callbell and environment. POC discussed.

## 2013-10-15 NOTE — Care Management Note (Addendum)
    Page 1 of 1   10/16/2013     10:24:22 AM   CARE MANAGEMENT NOTE 10/16/2013  Patient:  Debra Kelly, Debra Kelly   Account Number:  [id number]  Date Initiated:  10/15/2013  Documentation initiated by:  Lanier Clam  Subjective/Objective Assessment:   72 Y/O F ADMITTED W/RECTOCELE MILD VAULT PROLAPSE.     Action/Plan:   FROM HOME ALONE.   Anticipated DC Date:  10/16/2013   Anticipated DC Plan:  HOME/SELF CARE      DC Planning Services  CM consult      Choice offered to / List presented to:             Status of service:  Completed, signed off Medicare Important Message given?   (If response is "NO", the following Medicare IM given date fields will be blank) Date Medicare IM given:   Date Additional Medicare IM given:    Discharge Disposition:  HOME/SELF CARE  Per UR Regulation:  Reviewed for med. necessity/level of care/duration of stay  If discussed at Long Length of Stay Meetings, dates discussed:    Comments:  10/15/13 Akelia Husted RN,BSN NCM [phone] S/P RECTOCELE REPAIR,GRAFT,& VAULT SUSPENSION.NO ANTICIPATED D/C NEEDS.

## 2013-10-15 NOTE — Transfer of Care (Signed)
Immediate Anesthesia Transfer of Care Note  Patient: Debra Kelly  Procedure(s) Performed: Procedure(s): RECTOCELE REPAIR WITH GRAFT  ANAD VAULT SUSPINSION (N/A)  Patient Location: PACU  Anesthesia Type:General  Level of Consciousness: awake, alert , oriented, patient cooperative and responds to stimulation  Airway & Oxygen Therapy: Patient Spontanous Breathing and Patient connected to face mask oxygen  Post-op Assessment: Report given to PACU RN, Post -op Vital signs reviewed and stable and Patient moving all extremities  Post vital signs: Reviewed and stable  Complications: No apparent anesthesia complications

## 2013-10-15 NOTE — Op Note (Signed)
Preoperative diagnosis: Rectocele and mild vault prolapse Postoperative diagnosis: Rectocele and vault prolapse Surgery: Rectocele repair and graft and vault suspension Surgeon: Dr. Lorin Picket Margit Batte Asst.: Pecola Leisure  The patient consented the above procedure with the above diagnoses. Preoperative laboratory tests were normal. Preoperative antibiotics were given. Leg position was excellent to minimize risk of compartment syndrome and neuropathy and deep vein thrombosis.  The patient had a large grade 2 posterior defect bulging to the introitus. I spent a few minutes marking the vaginal cuff. I was very diligent to mark the area that would maximize vaginal length and take care of the apical defect. Her apex distended 3-4 cm under tension and a lot of central defect almost as if she had an enterocele but she did not  I instilled 20 cc of a lidocaine epinephrine mixture submucosally to develop a plane and for hemostasis  I placed an Allis on the hymenal ring and I removed a small triangle perineal skin. She had an elevated posterior fourchette relative to a depressed distal rectum that was fibrosed from a previous probable episiotomy. The amount of depth was quite significant but she did not need to be insized in the midline for exposure. With my usual technique I made a long posterior vaginal wall incision all  the way to the apex and mobilized very nicely to the sidewall bilaterally. I partially broke through the paracolic gutters bilaterally but did not dissect to the initial spine bilaterally but could feel them  I did a digital rectal examination and the rectovaginal fascia was quite thick. She did not have an enterocele. She had an apical defect but after appropriate mobilization I closed by reapproximating the rectovaginal fascia to the apex with a trapdoor-like closure. I was very pleased with this and there was no shortening.  I then did a gentle 2 layer rectocele repair in the midline  with 2-0 Vicryl. The tissues were quite thick and robust.  It was apparent that the patient needed support at the apex. I finger dissected to the iliococcygeus bilaterally as noted above. With my usual technique I placed a 0 Vicryl suture into the iliococcygeus muscle bilaterally. I did a rectal examination there is no injury to the rectum or suture in the rectum.  A 7 x 4 cm graft was cut in the shape of a trapezoid. Approximately 1.5 cm was cut from its width. It was sutured in place supporting the apex and supporting the upper two thirds of the rectocele repair and a trapdoor repair. The cephalad midline aspect of the graft was approximated with Vicryl suture to the vaginal apex. It was not under tension. 0 Vicryl was used to reapproximate the graft distally to the rectovaginal fascia at the interface of the pelvic sidewall  A minimal amount of vaginal wall mucosa was removed mostly cephalad. I closed the posterior vaginal wall with running 2-0 Vicryl and CT1 needle. A gentle 0 Vicryl was used for the perineal body. The suture was exteriorized and the perineum was closed with subcuticular suture.  Urine output was excellent. Repeat rectal examination was normal. She had very good vaginal length and very good support posteriorly. Single vaginal pack with Estrace cream was applied. Leg position was excellent.  The case was challenging but overall I was very pleased with the repair and hopefully it reaches the patient's treatment goal

## 2013-10-15 NOTE — Progress Notes (Signed)
Minimal pain No nerve pain Looks great

## 2013-10-16 ENCOUNTER — Encounter (HOSPITAL_COMMUNITY): Payer: Self-pay | Admitting: Urology

## 2013-10-16 LAB — BASIC METABOLIC PANEL
BUN: 6 mg/dL (ref 6–23)
CO2: 30 mEq/L (ref 19–32)
Chloride: 106 mEq/L (ref 96–112)
GFR calc Af Amer: >90 mL/min (ref >90)
GFR calc non Af Amer: 85 mL/min — ABNORMAL LOW (ref >90)
Glucose, Bld: 130 mg/dL — ABNORMAL HIGH (ref 70–99)
Potassium: 3.7 mEq/L (ref 3.5–5.1)

## 2013-10-16 NOTE — Progress Notes (Signed)
Vitals normal Laboratory tests normal Patient alert and stable Pain minimal and well-controlled Post treatment course discussed in detail Followup discussed in detail See orders 

## 2013-10-16 NOTE — Discharge Summary (Signed)
Date of admission: 10/15/2013  Date of discharge: 10/16/2013  Admission diagnosis: Rectocele Discharge diagnosis: Rectocele and vault prolapse  Secondary diagnoses: Vault prolapse  History and Physical: For full details, please see admission history and physical. Briefly, Nimah Uphoff is a 72 y.o. year old patient with the above diagnosis. Surgery went very well. Post op course went well.   Hospital Course: uneventful  Laboratory values:  Recent Labs  10/15/13 1543 10/16/13 0537  HGB 12.5 11.1*  HCT 38.3 35.2*    Recent Labs  10/16/13 0537  CREATININE 0.70    Disposition: Home  Discharge instruction: The patient was instructed to be ambulatory but told to refrain from heavy lifting, strenuous activity, or driving. Reviewed  Discharge medications:    Medication List    STOP taking these medications       aspirin 81 MG chewable tablet     cholecalciferol 1000 UNITS tablet  Commonly known as:  VITAMIN D     vitamin C 500 MG tablet  Commonly known as:  ASCORBIC ACID      TAKE these medications       ciprofloxacin 250 MG tablet  Commonly known as:  CIPRO  Take 1 tablet (250 mg total) by mouth 2 (two) times daily.     estrogens (conjugated) 0.3 MG tablet  Commonly known as:  PREMARIN  Take 0.3 mg by mouth daily.     levothyroxine 150 MCG tablet  Commonly known as:  SYNTHROID, LEVOTHROID  Take 150 mcg by mouth every morning.     lisinopril 10 MG tablet  Commonly known as:  PRINIVIL,ZESTRIL  Take 10 mg by mouth every morning.     meloxicam 15 MG tablet  Commonly known as:  MOBIC  Take 15 mg by mouth daily.     oxybutynin 5 MG tablet  Commonly known as:  DITROPAN  Take 5 mg by mouth 2 (two) times daily.     traMADol 50 MG tablet  Commonly known as:  ULTRAM  Take 1 tablet (50 mg total) by mouth every 6 (six) hours as needed. For knee pain     zolpidem 5 MG tablet  Commonly known as:  AMBIEN  Take 5 mg by mouth at bedtime as needed. For sleep         Followup:      Follow-up Information   Follow up with Saagar Tortorella A, MD. (office will call you with date and time of follow up appt.)    Specialty:  Urology   Contact information:   [address]LO[address] [facility] [place] [address] [phone]       Follow up with Syvanna Ciolino A, MD. (as scheduled)    Specialty:  Urology   Contact information:   [address]LO[address] [facility] [place] [address] [phone]

## 2014-02-04 ENCOUNTER — Other Ambulatory Visit: Payer: Self-pay | Admitting: Obstetrics and Gynecology

## 2014-02-04 DIAGNOSIS — R928 Other abnormal and inconclusive findings on diagnostic imaging of breast: Secondary | ICD-10-CM

## 2014-02-14 ENCOUNTER — Ambulatory Visit
Admission: RE | Admit: 2014-02-14 | Discharge: 2014-02-14 | Disposition: A | Discharge status: Home / Self Care | Payer: Medicare Other | Source: Ambulatory Visit | Attending: Obstetrics and Gynecology | Admitting: Obstetrics and Gynecology

## 2014-02-14 ENCOUNTER — Other Ambulatory Visit: Payer: Self-pay | Admitting: Obstetrics and Gynecology

## 2014-02-14 DIAGNOSIS — R928 Other abnormal and inconclusive findings on diagnostic imaging of breast: Secondary | ICD-10-CM

## 2014-02-14 DIAGNOSIS — N6320 Unspecified lump in the left breast, unspecified quadrant: Secondary | ICD-10-CM

## 2014-02-18 ENCOUNTER — Ambulatory Visit
Admission: RE | Admit: 2014-02-18 | Discharge: 2014-02-18 | Disposition: A | Discharge status: Home / Self Care | Payer: Medicare Other | Source: Ambulatory Visit | Attending: Obstetrics and Gynecology | Admitting: Obstetrics and Gynecology

## 2014-02-18 ENCOUNTER — Other Ambulatory Visit: Payer: Self-pay | Admitting: Obstetrics and Gynecology

## 2014-02-18 DIAGNOSIS — N6320 Unspecified lump in the left breast, unspecified quadrant: Secondary | ICD-10-CM

## 2014-02-18 HISTORY — PX: BREAST BIOPSY: SHX20

## 2015-03-26 ENCOUNTER — Other Ambulatory Visit: Payer: Self-pay | Admitting: Obstetrics and Gynecology

## 2015-03-26 DIAGNOSIS — R928 Other abnormal and inconclusive findings on diagnostic imaging of breast: Secondary | ICD-10-CM

## 2015-04-03 ENCOUNTER — Other Ambulatory Visit: Payer: Self-pay | Admitting: Obstetrics and Gynecology

## 2015-04-03 ENCOUNTER — Ambulatory Visit
Admission: RE | Admit: 2015-04-03 | Discharge: 2015-04-03 | Disposition: A | Discharge status: Home / Self Care | Payer: Medicare Other | Source: Ambulatory Visit | Attending: Obstetrics and Gynecology | Admitting: Obstetrics and Gynecology

## 2015-04-03 DIAGNOSIS — R928 Other abnormal and inconclusive findings on diagnostic imaging of breast: Secondary | ICD-10-CM

## 2015-04-09 ENCOUNTER — Other Ambulatory Visit: Payer: Self-pay | Admitting: Obstetrics and Gynecology

## 2015-04-09 DIAGNOSIS — R928 Other abnormal and inconclusive findings on diagnostic imaging of breast: Secondary | ICD-10-CM

## 2015-04-16 ENCOUNTER — Ambulatory Visit
Admission: RE | Admit: 2015-04-16 | Discharge: 2015-04-16 | Disposition: A | Discharge status: Home / Self Care | Payer: Medicare Other | Source: Ambulatory Visit | Attending: Obstetrics and Gynecology | Admitting: Obstetrics and Gynecology

## 2015-04-16 DIAGNOSIS — R928 Other abnormal and inconclusive findings on diagnostic imaging of breast: Secondary | ICD-10-CM

## 2015-04-16 DIAGNOSIS — C50919 Malignant neoplasm of unspecified site of unspecified female breast: Secondary | ICD-10-CM

## 2015-04-16 HISTORY — DX: Malignant neoplasm of unspecified site of unspecified female breast: C50.919

## 2015-04-16 HISTORY — PX: BREAST BIOPSY: SHX20

## 2015-04-20 ENCOUNTER — Encounter: Payer: Self-pay | Admitting: *Deleted

## 2015-04-20 ENCOUNTER — Telehealth: Payer: Self-pay | Admitting: *Deleted

## 2015-04-20 DIAGNOSIS — C50411 Malignant neoplasm of upper-outer quadrant of right female breast: Secondary | ICD-10-CM

## 2015-04-20 HISTORY — DX: Malignant neoplasm of upper-outer quadrant of right female breast: C50.411

## 2015-04-20 NOTE — Telephone Encounter (Signed)
Confirmed [facility] for 04/22/15 at 0800 .  Instructions and contact information given.

## 2015-04-22 ENCOUNTER — Ambulatory Visit: Payer: Medicare Other | Attending: Surgery | Admitting: Physical Therapy

## 2015-04-22 ENCOUNTER — Encounter: Payer: Self-pay | Admitting: Physical Therapy

## 2015-04-22 ENCOUNTER — Ambulatory Visit
Admission: RE | Admit: 2015-04-22 | Discharge: 2015-04-22 | Disposition: A | Discharge status: Home / Self Care | Payer: Medicare Other | Source: Ambulatory Visit | Attending: Radiation Oncology | Admitting: Radiation Oncology

## 2015-04-22 ENCOUNTER — Encounter: Payer: Self-pay | Admitting: Hematology and Oncology

## 2015-04-22 ENCOUNTER — Encounter: Payer: Self-pay | Admitting: *Deleted

## 2015-04-22 ENCOUNTER — Other Ambulatory Visit: Payer: Self-pay | Admitting: Surgery

## 2015-04-22 ENCOUNTER — Encounter: Payer: Self-pay | Admitting: Skilled Nursing Facility1

## 2015-04-22 ENCOUNTER — Other Ambulatory Visit (HOSPITAL_BASED_OUTPATIENT_CLINIC_OR_DEPARTMENT_OTHER): Payer: Medicare Other

## 2015-04-22 ENCOUNTER — Ambulatory Visit: Payer: Medicare Other

## 2015-04-22 ENCOUNTER — Telehealth: Payer: Self-pay | Admitting: *Deleted

## 2015-04-22 ENCOUNTER — Ambulatory Visit (HOSPITAL_BASED_OUTPATIENT_CLINIC_OR_DEPARTMENT_OTHER): Payer: Medicare Other | Admitting: Hematology and Oncology

## 2015-04-22 VITALS — BP 134/55 | HR 90 | Temp 98.0°F | Resp 18 | Ht 65.0 in | Wt 197.0 lb

## 2015-04-22 DIAGNOSIS — C50411 Malignant neoplasm of upper-outer quadrant of right female breast: Secondary | ICD-10-CM

## 2015-04-22 DIAGNOSIS — C50911 Malignant neoplasm of unspecified site of right female breast: Secondary | ICD-10-CM

## 2015-04-22 DIAGNOSIS — R293 Abnormal posture: Secondary | ICD-10-CM | POA: Diagnosis not present

## 2015-04-22 DIAGNOSIS — Z170 Estrogen receptor positive status [ER+]: Secondary | ICD-10-CM | POA: Diagnosis not present

## 2015-04-22 LAB — COMPREHENSIVE METABOLIC PANEL (CC13)
ALBUMIN: 3.6 g/dL (ref 3.5–5.0)
ALT: 13 U/L (ref 0–55)
ANION GAP: 11 meq/L (ref 3–11)
AST: 13 U/L (ref 5–34)
Alkaline Phosphatase: 67 U/L (ref 40–150)
BUN: 15.1 mg/dL (ref 7.0–26.0)
CHLORIDE: 105 meq/L (ref 98–109)
CO2: 26 meq/L (ref 22–29)
Calcium: 9 mg/dL (ref 8.4–10.4)
Creatinine: 0.7 mg/dL (ref 0.6–1.1)
EGFR: 83 mL/min/{1.73_m2} — AB (ref >90)
GLUCOSE: 110 mg/dL (ref 70–140)
POTASSIUM: 4.2 meq/L (ref 3.5–5.1)
Sodium: 142 mEq/L (ref 136–145)
Total Bilirubin: 0.5 mg/dL (ref 0.20–1.20)
Total Protein: 6.7 g/dL (ref 6.4–8.3)

## 2015-04-22 LAB — CBC WITH DIFFERENTIAL/PLATELET
BASO%: 1.1 % (ref 0.0–2.0)
Basophils Absolute: 0.1 10*3/uL (ref 0.0–0.1)
EOS ABS: 0.2 10*3/uL (ref 0.0–0.5)
EOS%: 3.8 % (ref 0.0–7.0)
HEMATOCRIT: 40.4 % (ref 34.8–46.6)
HGB: 13.2 g/dL (ref 11.6–15.9)
LYMPH#: 1.4 10*3/uL (ref 0.9–3.3)
LYMPH%: 26.7 % (ref 14.0–49.7)
MCH: 30.3 pg (ref 25.1–34.0)
MCHC: 32.6 g/dL (ref 31.5–36.0)
MCV: 92.9 fL (ref 79.5–101.0)
MONO#: 0.6 10*3/uL (ref 0.1–0.9)
MONO%: 12.2 % (ref 0.0–14.0)
NEUT%: 56.2 % (ref 38.4–76.8)
NEUTROS ABS: 2.9 10*3/uL (ref 1.5–6.5)
Platelets: 259 10*3/uL (ref 145–400)
RBC: 4.35 10*6/uL (ref 3.70–5.45)
RDW: 14 % (ref 11.2–14.5)
WBC: 5.2 10*3/uL (ref 3.9–10.3)

## 2015-04-22 NOTE — Progress Notes (Signed)
Radiation Oncology         [phone]) [phone] ________________________________  Initial outpatient Consultation - Date: 04/22/2015   Name: Debra Kelly MRN: [id number]   DOB: 1941/07/09  REFERRING PHYSICIAN: Alphonsa Overall, MD  DIAGNOSIS AND STAGE: No matching staging information was found for the patient.  HISTORY OF PRESENT ILLNESS::Debra Kelly is a 74 y.o. female who presents for her screening mammogram. She undewent a bilateral tomosynthesis mammogram which showed bilateral breast masses. The masses on the left breast were benign and found to be cysts. The right breast showed a 7 mm area of distortion. 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 This was ER and PR + and HER2 -. Ki 67 was 10%. She has recovered well from the biopsy and presents for my opinion regarding radiation treatment and the management of her disease. She is accompanied by her daughter today.   PREVIOUS RADIATION THERAPY: No  Past medical, social and family history were reviewed in the electronic chart. Review of symptoms was reviewed in the electronic chart. Medications were reviewed in the electronic chart.  Gynecologic History  Age at first menstrual period? 13  Are you still having periods? No. Approximate date of last period? N/A Obstetric History:  How many children have you carried to term? 2. Your age at first live birth? 21  Pregnant now or trying to get pregnant? No  Have you used birth control pills or hormone shots for contraception? Yes  If so, for how long (or approximate dates)? 6 years Health Maintenance:  Have you ever had a colonoscopy? Yes If yes, date? 2011  Have you ever had a bone density? Yes If yes, date? 2015  Date of your last PAP smear? 04/22/15 Date of your FIRST mammogram? 1965?   PHYSICAL EXAM: There were no vitals filed for this visit.. . Pleasant female. No distress. Some bruising in the right breast. Lumpy breast bilaterally. No palpable  cervical, supraclavicular or axillary adenopathy.    IMPRESSION: 74 y/o woman with T2N0 Invasive Mammary Carcinoma Stage II  PLAN: I spoke to the patient today regarding her diagnosis and options for treatment. We discussed the equivalence in terms of survival and local failure between mastectomy and breast conservation. We discussed the role of radiation in decreasing local failures in patients who undergo lumpectomy. We discussed the low rates of failure in patients her age with these non aggressive breast cancers. We discussed the randomized trials in elderly women showing equivalent survival when omitting raidiaton and taking an AI instead. We discussed the randomized trials showing a slight improvement in local control with the combination.We discussed the differences between systemic and local treatment. She is leaning towards radiation treatment. She is going to meet Dr. Lucia Kelly and Dr. Lindi Kelly.  We discussed the process of simulation and the placement tattoos.  We discussed the possibility of asymptomatic lung damage. We discussed the low likelihood of secondary malignancies. We discussed the possible side effects including but not limited to skin redness, fatigue, permanent skin darkening, and breast swelling.  The pt has a trip planned July 4th weekend and I believe it would be fine to delay the radiation treatment until after the trip. The patient is interested in the radiation treatment over the anti-estrogen therapy.  I spent 60 minutes  face to face with the patient and more than 50% of that time was spent in counseling and/or coordination of care.  This document serves as a record of services personally  performed by Thea Silversmith, MD. It was created on her behalf by Darcus Austin, a trained medical scribe. The creation of this record is based on the scribe's personal observations and the provider's statements to them. This document has been checked and approved by the attending provider.    ------------------------------------------------  Thea Silversmith, MD

## 2015-04-22 NOTE — Telephone Encounter (Signed)
Received bone density report from [facility]. Sent to scan.

## 2015-04-22 NOTE — Patient Instructions (Signed)

## 2015-04-22 NOTE — Progress Notes (Signed)
[place] NOTE  Patient Care Team: Olin Hauser, MD as PCP - General (Family Medicine) Alphonsa Overall, MD as Consulting Physician (General Surgery) Nicholas Lose, MD as Consulting Physician (Hematology and Oncology) Thea Silversmith, MD as Consulting Physician (Radiation Oncology) Mauro Kaufmann, RN as Registered Nurse Rockwell Germany, RN as Registered Nurse  CHIEF COMPLAINTS/PURPOSE OF CONSULTATION:  Newly diagnosed right breast cancer  HISTORY OF PRESENTING ILLNESS:  Debra Kelly 74 y.o. female is here because of recent diagnosis of right breast cancer. In 1993 patient had a right breast lumpectomy that was benign. In 02/18/2014 showed a left breast biopsy which showed fibrocystic changes. And most recently she underwent a mammogram that showed right breast distortion that led to an ultrasound and a stereotactic biopsy that revealed 11:00 position to have invasive lobular cancer. It was 7 mm by ultrasound grade 2, ER/PR positive HER-2 negative with a Ki-67 of 10%. She was presented this morning at the multidisciplinary tumor board and she is here today to discuss treatment plan. She is accompanied by her daughter.  She is apparently getting retired in 3 months and has a lot of plans to go fishing and camping. She has been taking Premarin for an extended period of time. She currently has a boyfriend.  I reviewed her records extensively and collaborated the history with the patient.  SUMMARY OF ONCOLOGIC HISTORY:   Breast cancer of upper-outer quadrant of right female breast   04/03/2015 Mammogram Right breast architectural distortion 11:00 position 7 mm by ultrasound, biopsied by stereotactic approach.   04/16/2015 Initial Diagnosis Right breast biopsy: Invasive and in situ mammary carcinoma possibly lobular, grade 2, ER/PR positive HER-2 negative Ki-67 10%    In terms of breast cancer risk profile:  She menarched at early age of 13   She had 2 pregnancy, her first  child was born at age 86  She has received birth control pills for approximately 6 years.  She was exposed to fertility medications or hormone replacement therapy.  She has no family history of Breast/GYN/GI cancer Brother prostate cancer 65, another brother prostate cancer, daughter died of heart attack obesity related  MEDICAL HISTORY:  Past Medical History  Diagnosis Date  . Hypertension   . Hypothyroidism 09-13-12    tx. supplement  . Arthritis 09-13-12    arthritis- knees, right shoulder, finger  . Breast cancer of upper-outer quadrant of right female breast 04/20/2015  . Breast cancer     SURGICAL HISTORY: Past Surgical History  Procedure Laterality Date  . Tubal ligation  09-13-12    '76  . Appendectomy  09-13-12    '76  . Abdominal hysterectomy  09-13-12    '88  . Breast surgery  09-13-12    '86-rt. breast lumpectomy-benign  . Knee arthroscopy  09-13-12    '04-lt. knee/ '10,'11-right knee scopes  . Cystocele repair  09/18/2012    Procedure: ANTERIOR REPAIR (CYSTOCELE);  Surgeon: Reece Packer, MD;  Location: [facility];  Service: Urology;  Laterality: N/A;  Cystocele/Vault Repair/Cystoscopy and Graft  . Vaginal prolapse repair  09/18/2012    Procedure: VAGINAL VAULT SUSPENSION;  Surgeon: Reece Packer, MD;  Location: [facility];  Service: Urology;  Laterality: N/A;  . Cystoscopy  09/18/2012    Procedure: CYSTOSCOPY;  Surgeon: Reece Packer, MD;  Location: [facility];  Service: Urology;  Laterality: N/A;  . Rectocele repair N/A 10/15/2013    Procedure: RECTOCELE REPAIR WITH GRAFT  ANAD VAULT SUSPINSION;  Surgeon: Nicki Reaper  A MacDiarmid, MD;  Location: [facility];  Service: Urology;  Laterality: N/A;    SOCIAL HISTORY: History   Social History  . Marital Status: Widowed    Spouse Name: N/A  . Number of Children: N/A  . Years of Education: N/A   Occupational History  . Not on file.   Social History Main Topics  . Smoking status: Former Smoker    Types: Cigars  . Smokeless  tobacco: Not on file  . Alcohol Use: 0.6 oz/week    1 Glasses of wine per week     Comment: weekends 1-2 drinks  . Drug Use: No  . Sexual Activity: Yes   Other Topics Concern  . Not on file   Social History Narrative    FAMILY HISTORY: Family History  Problem Relation Age of Onset  . Prostate cancer Brother   . Prostate cancer Brother     ALLERGIES:  is allergic to shellfish allergy; celebrex; claritin; adhesive; barbiturates; and codeine.  MEDICATIONS:  Current Outpatient Prescriptions  Medication Sig Dispense Refill  . aspirin 81 MG tablet Take 81 mg by mouth daily.    . Calcium Carbonate (CALCIUM 600 PO) Take by mouth daily.    . Cholecalciferol (VITAMIN D PO) Take 600 mg by mouth daily.    Marland Kitchen estrogens, conjugated, (PREMARIN) 0.3 MG tablet Take 0.3 mg by mouth daily.    Marland Kitchen levothyroxine (SYNTHROID, LEVOTHROID) 175 MCG tablet Take 175 mcg by mouth daily before breakfast.    . lisinopril (PRINIVIL,ZESTRIL) 10 MG tablet Take 10 mg by mouth every morning.    . Magnesium 500 MG CAPS Take by mouth daily.    Marland Kitchen oxybutynin (DITROPAN) 5 MG tablet Take 5 mg by mouth daily.      No current facility-administered medications for this visit.    REVIEW OF SYSTEMS:   Constitutional: Denies fevers, chills or abnormal night sweats Eyes: Denies blurriness of vision, double vision or watery eyes Ears, nose, mouth, throat, and face: Denies mucositis or sore throat Respiratory: Denies cough, dyspnea or wheezes Cardiovascular: Denies palpitation, chest discomfort or lower extremity swelling Gastrointestinal:  Denies nausea, heartburn or change in bowel habits Skin: Denies abnormal skin rashes Lymphatics: Denies new lymphadenopathy or easy bruising Neurological:Denies numbness, tingling or new weaknesses Behavioral/Psych: Mood is stable, no new changes  Breast:  Denies any palpable lumps or discharge All other systems were reviewed with the patient and are negative.  PHYSICAL  EXAMINATION: ECOG PERFORMANCE STATUS: 0 - Asymptomatic  Filed Vitals:   04/22/15 0816  BP: 134/55  Pulse: 90  Temp: 98 F (36.7 C)  Resp: 18   Filed Weights   04/22/15 0816  Weight: 197 lb (89.359 kg)    GENERAL:alert, no distress and comfortable SKIN: skin color, texture, turgor are normal, no rashes or significant lesions EYES: normal, conjunctiva are pink and non-injected, sclera clear OROPHARYNX:no exudate, no erythema and lips, buccal mucosa, and tongue normal  NECK: supple, thyroid normal size, non-tender, without nodularity LYMPH:  no palpable lymphadenopathy in the cervical, axillary or inguinal LUNGS: clear to auscultation and percussion with normal breathing effort HEART: regular rate & rhythm and no murmurs and no lower extremity edema ABDOMEN:abdomen soft, non-tender and normal bowel sounds Musculoskeletal:no cyanosis of digits and no clubbing  PSYCH: alert & oriented x 3 with fluent speech NEURO: no focal motor/sensory deficits BREAST: No palpable nodules in breast. No palpable axillary or supraclavicular lymphadenopathy (exam performed in the presence of a chaperone)   LABORATORY DATA:  I have  reviewed the data as listed Lab Results  Component Value Date   WBC 5.2 04/22/2015   HGB 13.2 04/22/2015   HCT 40.4 04/22/2015   MCV 92.9 04/22/2015   PLT 259 04/22/2015   Lab Results  Component Value Date   NA 142 04/22/2015   K 4.2 04/22/2015   CL 106 10/16/2013   CO2 26 04/22/2015    RADIOGRAPHIC STUDIES: I have personally reviewed the radiological reports and agreed with the findings in the report. Results are summarized as above  ASSESSMENT AND PLAN:  Breast cancer of upper-outer quadrant of right female breast Right breast invasive lobular carcinoma detected as distortion 7 mm by ultrasound, grade 2, ER/PR positive, HER-2 negative, Ki-67 10%  Pathology radiology counseling:Discussed with the patient, the details of pathology including the type of  breast cancer,the clinical staging, the significance of ER, PR and HER-2/neu receptors and the implications for treatment. After reviewing the pathology in detail, we proceeded to discuss the different treatment options between surgery, radiation, chemotherapy, antiestrogen therapies.  Recommendation: 1. Breast conserving surgery with sentinel lymph node biopsy 2. +/- Adjuvant radiation 3. Adjuvant antiestrogen therapy I did not discuss with her about Oncotype DX testing because it looks like a very favorable breast cancer by clinical criteria. If the tumor is found to be significantly larger we could discuss sending Oncotype DX and the final pathology.  Return to clinic after surgery to discuss final pathology result  All questions were answered. The patient knows to call the clinic with any problems, questions or concerns.    Rulon Eisenmenger, MD 10:52 AM

## 2015-04-22 NOTE — Progress Notes (Signed)
Checked in new pt with no financial concerns prior to seeing the dr.  Pt has 2 insurances so financial assistance may not be needed but she has my card for any billing questions or concerns. °

## 2015-04-22 NOTE — Progress Notes (Signed)
Subjective:     Patient ID: Debra Kelly, female   DOB: 09-25-1941, 74 y.o.   MRN: [id number]  HPI   Review of Systems     Objective:   Physical Exam For the patient to understand and be given the tools to implement a healthy plant based diet during their cancer diagnosis.     Assessment:     Patient was seen today and found to be in good spirits and accompanied by her seemingly supportive daughter. Pts current/relevant medications lisinopril, synthroid, vitamin D, calcium, and magnesium. Pt has bilateral breast masses. Pt is 5'5'' 197 pounds and BMI 32.9. Pts daughter states she is currently taking a "greens" supplement that she says is "practiclly curing her MS" which she states her mother will begin to take. Pt states she does not like non-starchy vegetables or beans. Pt states she gets in plenty of physical activity because she works four days a week.     Plan:     Dietitian educated the patient on implementing a plant based diet by incorporating more plant proteins, fruits, and vegetables. As a part of a healthy routine physical activity was discussed. Dietitian described the USP label and supplementation. Dietitian also encouraged the pt to consume more vegetables by offering some cooking options. A folder of evidence based information with a focus on a plant based diet and general nutrition during cancer was given to the patient.  The importance of legitimate, evidence based information was discussed and examples were given. As a part of the continuum of care the cancer dietitian's contact information was given to the patient in the event they would like to have a follow up appointment.

## 2015-04-22 NOTE — Progress Notes (Signed)
Ms. Heuberger is a very pleasant 74 y.o. 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  Biopsy results revealed the tumor's prognostic profile is ER positive, PR positive, and HER2/neu negative. Ki67 is 10%.  She presents today with her daughter to the [facility]) for treatment consideration and recommendations from the breast surgeon, radiation oncologist, and medical oncologist.     I briefly met with Ms. Hildebrandt and her daughter during her [facility] visit today. We discussed the purpose of the Survivorship Clinic, which will include monitoring for recurrence, coordinating completion of age and gender-appropriate cancer screenings, promotion of overall wellness, as well as managing potential late/long-term side effects of anti-cancer treatments.    The treatment plan for Ms. Leiva will likely include surgery, radiation therapy, and anti-estrogen therapy.  As of today, the intent of treatment for Ms. Hubbard is cure, therefore she will be eligible for the Survivorship Clinic upon her completion of treatment.  Her survivorship care plan (SCP) document will be drafted and updated throughout the course of her treatment trajectory. She will receive the SCP in an office visit with myself in the Survivorship Clinic once she has completed treatment.   Ms. Stetzer was encouraged to ask questions and all questions were answered to her satisfaction.  She was given my business card and encouraged to contact me with any concerns regarding survivorship.  I look forward to participating in her care.   Mike Craze, NP [place] [phone]

## 2015-04-22 NOTE — Therapy (Signed)
[place], [place], [address] Phone: [phone]   Fax:  [phone]  Physical Therapy Evaluation  Patient Details  Name: Debra Kelly MRN: [id number] Date of Birth: 1941/04/24 Referring Provider:  Alphonsa Overall, MD  Encounter Date: 04/22/2015      PT End of Session - 04/22/15 1000    Visit Number 1   Number of Visits 1   PT Start Time 0928   PT Stop Time 0952   PT Time Calculation (min) 24 min   Activity Tolerance Patient tolerated treatment well   Behavior During Therapy [facility] for tasks assessed/performed      Past Medical History  Diagnosis Date  . Hypertension   . Hypothyroidism 09-13-12    tx. supplement  . Arthritis 09-13-12    arthritis- knees, right shoulder, finger  . Breast cancer of upper-outer quadrant of right female breast 04/20/2015  . Breast cancer     Past Surgical History  Procedure Laterality Date  . Tubal ligation  09-13-12    '76  . Appendectomy  09-13-12    '76  . Abdominal hysterectomy  09-13-12    '88  . Breast surgery  09-13-12    '86-rt. breast lumpectomy-benign  . Knee arthroscopy  09-13-12    '04-lt. knee/ '10,'11-right knee scopes  . Cystocele repair  09/18/2012    Procedure: ANTERIOR REPAIR (CYSTOCELE);  Surgeon: Reece Packer, MD;  Location: [facility];  Service: Urology;  Laterality: N/A;  Cystocele/Vault Repair/Cystoscopy and Graft  . Vaginal prolapse repair  09/18/2012    Procedure: VAGINAL VAULT SUSPENSION;  Surgeon: Reece Packer, MD;  Location: [facility];  Service: Urology;  Laterality: N/A;  . Cystoscopy  09/18/2012    Procedure: CYSTOSCOPY;  Surgeon: Reece Packer, MD;  Location: [facility];  Service: Urology;  Laterality: N/A;  . Rectocele repair N/A 10/15/2013    Procedure: RECTOCELE REPAIR WITH GRAFT  ANAD VAULT SUSPINSION;  Surgeon: Reece Packer, MD;  Location: [facility];  Service: Urology;  Laterality: N/A;    There were no vitals filed for this  visit.  Visit Diagnosis:  Breast cancer, right breast - Plan: PT plan of care cert/re-cert  Abnormal posture - Plan: PT plan of care cert/re-cert      Subjective Assessment - 04/22/15 0951    Subjective Patient was seen today for a baseline assessment of her newly diagnosed right breast cancer.   Patient is accompained by: Family member   Pertinent History Diagnosed 04/17/15 with right ER/PR positive, HER2 negative invasive breast cancer measuring 7 mm in size.     Patient Stated Goals Reduce lymphedema risk and learn post op shoulder ROM goals   Currently in Pain? No/denies            [facility] PT Assessment - 04/22/15 0001    Assessment   Medical Diagnosis Right breast cancer   Onset Date 04/17/15   Precautions   Precautions Other (comment)  active breast cancer   Restrictions   Weight Bearing Restrictions No   Balance Screen   Has the patient fallen in the past 6 months No   Has the patient had a decrease in activity level because of a fear of falling?  No   Is the patient reluctant to leave their home because of a fear of falling?  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   Vocation Requirements computer/desk work   Leisure She rides a stationary bike 3x/wk for 30 min   Cognition   Overall Cognitive Status Within Functional Limits for tasks assessed   Posture/Postural Control   Posture/Postural Control Postural limitations   Postural Limitations Rounded Shoulders;Forward head   ROM / Strength   AROM / PROM / Strength AROM;Strength   AROM   AROM Assessment Site Shoulder   Right/Left Shoulder Right;Left   Right Shoulder Extension 60 Degrees   Right Shoulder Flexion 150 Degrees   Right Shoulder ABduction 143 Degrees   Right Shoulder Internal Rotation  81 Degrees   Right Shoulder External Rotation 79 Degrees   Left Shoulder Extension 55 Degrees   Left Shoulder Flexion 145 Degrees   Left Shoulder ABduction 134 Degrees   Left Shoulder Internal Rotation 82 Degrees   Left Shoulder External Rotation 84 Degrees   Strength   Overall Strength Within functional limits for tasks performed           LYMPHEDEMA/ONCOLOGY QUESTIONNAIRE - 04/22/15 0955    Type   Cancer Type Right breast   Lymphedema Assessments   Lymphedema Assessments Upper extremities   Right Upper Extremity Lymphedema   10 cm Proximal to Olecranon Process 30.6 cm   Olecranon Process 27 cm   10 cm Proximal to Ulnar Styloid Process 21.9 cm   Just Proximal to Ulnar Styloid Process 16.5 cm   Across Hand at [employer] 19.2 cm   At [employer] of 2nd Digit 6.2 cm   Left Upper Extremity Lymphedema   10 cm Proximal to Olecranon Process 32.5 cm   Olecranon Process 26.6 cm   10 cm Proximal to Ulnar Styloid Process 21 cm   Just Proximal to Ulnar Styloid Process 16.9 cm   Across Hand at [employer] 19.4 cm   At [place] of 2nd Digit 6.5 cm       Patient was instructed today in a home exercise program today for post op shoulder range of motion. These included active assist shoulder flexion in sitting, scapular retraction, wall walking with shoulder abduction, and hands behind head external rotation.  She was encouraged to do these twice a day, holding 3 seconds and repeating 5 times when permitted by her physician.        PT Education - 04/22/15 0958    Education provided Yes   Education Details Post op HEP and lymphedema risk reduction   Person(s) Educated Patient;Child(ren)   Methods Explanation;Demonstration;Handout   Comprehension Verbalized understanding;Returned demonstration              Breast Clinic Goals - 04/22/15 1004    Patient will be able to verbalize understanding of pertinent lymphedema risk reduction practices relevant to her diagnosis specifically  related to skin care.   Time 1   Period Days   Status Achieved   Patient will be able to return demonstrate and/or verbalize understanding of the post-op home exercise program related to regaining shoulder range of motion.   Time 1   Period Days   Status Achieved   Patient will be able to verbalize understanding of the importance of attending the postoperative After Breast Cancer Class for further lymphedema risk reduction education and therapeutic exercise.   Time 1   Period Days   Status Achieved              Plan -  2015-05-17 1000    Clinical Impression Statement Diagnosed 04/17/15 with right ER/PR positive, HER2 negative invasive breast cancer measuring 7 mm in size.   She is planning to have a right lumpectomy and sentinel node biopsy which will place her at approximately 5-9% risk of developing right arm/breast lymphedema.  She will likely have Oncotype testing , radiation and anti-estrogen therapy.  She will benefit from post op PT to regain shoulder ROM and strength and reduce her risk of lymphedema.   Pt will benefit from skilled therapeutic intervention in order to improve on the following deficits Decreased range of motion;Decreased strength;Impaired UE functional use;Pain;Decreased knowledge of precautions;Increased edema   Rehab Potential Excellent   Clinical Impairments Affecting Rehab Potential none   PT Frequency One time visit   PT Treatment/Interventions Patient/family education;Therapeutic exercise   Consulted and Agree with Plan of Care Patient;Family member/caregiver   Family Member Consulted daughter     Patient will follow up at outpatient cancer rehab if needed following surgery.  If the patient requires physical therapy at that time, a specific plan will be dictated and sent to the referring physician for approval. The patient was educated today on appropriate basic range of motion exercises to begin post operatively and the importance of attending the After  Breast Cancer class following surgery.  Patient was educated today on lymphedema risk reduction practices as it pertains to recommendations that will benefit the patient immediately following surgery.  She verbalized good understanding.  No additional physical therapy is indicated at this time.         G-Codes - 17-May-2015 1004    Functional Assessment Tool Used Clinical Judegement   Functional Limitation Self care   Self Care Current Status [phone]) At least 1 percent but less than 20 percent impaired, limited or restricted   Self Care Goal Status (O0321) At least 1 percent but less than 20 percent impaired, limited or restricted   Self Care Discharge Status [phone]) At least 1 percent but less than 20 percent impaired, limited or restricted       Problem List Patient Active Problem List   Diagnosis Date Noted  . Breast cancer of upper-outer quadrant of right female breast 04/20/2015    Annia Friendly, PT May 17, 2015, 10:08 AM  [place], [place], [address] Phone: [phone]   Fax:  [phone]

## 2015-04-22 NOTE — Assessment & Plan Note (Signed)
Right breast invasive lobular carcinoma detected as distortion 7 mm by ultrasound, grade 2, ER/PR positive, HER-2 negative, Ki-67 10%  Pathology radiology counseling:Discussed with the patient, the details of pathology including the type of breast cancer,the clinical staging, the significance of ER, PR and HER-2/neu receptors and the implications for treatment. After reviewing the pathology in detail, we proceeded to discuss the different treatment options between surgery, radiation, chemotherapy, antiestrogen therapies.  Recommendation: 1. Breast conserving surgery with sentinel lymph node biopsy 2. +/- Adjuvant radiation 3. Adjuvant antiestrogen therapy  Return to clinic after surgery to discuss final pathology result

## 2015-04-22 NOTE — Progress Notes (Signed)
Note created by Dr. Gudena during office visit. Copy to patient, original to scan. 

## 2015-04-22 NOTE — Progress Notes (Signed)
Clinical Social Work [place] Psychosocial Distress Screening [place]  Patient completed distress screening protocol and scored a 9 on the Psychosocial Distress Thermometer which indicates severe distress. Clinical Social Worker met with patient and patients family member in [facility] to assess for distress and other psychosocial needs. Patient stated she felt "better" after meeting with the treatment team and getting information on her treatment plan. CSW and patient discussed common feeling and emotions when being diagnosed with cancer. CSW and patient discussed the importance of support during treatment, and CSW informed patient of the support team and support services at [facility].CSW provided contact information and encouraged patient to call with any questions or concerns.  ONCBCN DISTRESS SCREENING 04/22/2015  Screening Type Initial Screening  Distress experienced in past week (1-10) 9  Practical problem type Housing  Emotional problem type Adjusting to illness  Spiritual/Religous concerns type Relating to God  Information Concerns Type Lack of info about diagnosis  Physical Problem type Pain  Physician notified of physical symptoms Yes  Referral to clinical psychology No  Referral to clinical social work Yes  Referral to dietition No  Referral to financial advocate No  Referral to palliative care No   Johnnye Lana, MSW, LCSW, OSW-C Clinical Social Worker [place] [phone]

## 2015-04-27 ENCOUNTER — Telehealth: Payer: Self-pay | Admitting: Hematology and Oncology

## 2015-04-27 ENCOUNTER — Telehealth: Payer: Self-pay | Admitting: *Deleted

## 2015-04-27 ENCOUNTER — Other Ambulatory Visit: Payer: Self-pay | Admitting: Surgery

## 2015-04-27 DIAGNOSIS — C50911 Malignant neoplasm of unspecified site of right female breast: Secondary | ICD-10-CM

## 2015-04-27 NOTE — Telephone Encounter (Signed)
Called and left a message with appointments °

## 2015-04-27 NOTE — Telephone Encounter (Signed)
Spoke to pt concerning [place] from 04/22/15. Denies needs or concerns at this time. Confirmed surgery date and f/u appt with Dr. Lindi Adie. Encourage pt to call with questions. Received verbal understanding. Contact information given.

## 2015-04-27 NOTE — Telephone Encounter (Signed)
Left vm for pt to return call regarding [place] from 04/22/15.

## 2015-05-05 ENCOUNTER — Encounter (HOSPITAL_BASED_OUTPATIENT_CLINIC_OR_DEPARTMENT_OTHER): Payer: Self-pay | Admitting: *Deleted

## 2015-05-05 NOTE — Progress Notes (Signed)
Patient had labs done at [facility] 04-22-15. She will come in after getting her seed placed on 05-06-15 for an EKG.

## 2015-05-06 ENCOUNTER — Encounter (HOSPITAL_BASED_OUTPATIENT_CLINIC_OR_DEPARTMENT_OTHER)
Admission: RE | Admit: 2015-05-06 | Discharge: 2015-05-06 | Disposition: A | Discharge status: Home / Self Care | Payer: Medicare Other | Source: Ambulatory Visit | Attending: Surgery | Admitting: Surgery

## 2015-05-06 ENCOUNTER — Ambulatory Visit
Admission: RE | Admit: 2015-05-06 | Discharge: 2015-05-06 | Disposition: A | Discharge status: Home / Self Care | Payer: Medicare Other | Source: Ambulatory Visit | Attending: Surgery | Admitting: Surgery

## 2015-05-06 ENCOUNTER — Other Ambulatory Visit: Payer: Self-pay

## 2015-05-06 DIAGNOSIS — I10 Essential (primary) hypertension: Secondary | ICD-10-CM | POA: Diagnosis not present

## 2015-05-06 DIAGNOSIS — J45909 Unspecified asthma, uncomplicated: Secondary | ICD-10-CM | POA: Diagnosis not present

## 2015-05-06 DIAGNOSIS — C50411 Malignant neoplasm of upper-outer quadrant of right female breast: Secondary | ICD-10-CM | POA: Diagnosis present

## 2015-05-06 DIAGNOSIS — C50911 Malignant neoplasm of unspecified site of right female breast: Secondary | ICD-10-CM

## 2015-05-07 NOTE — H&P (Signed)
Debra Kelly 04/22/2015 9:30 AM Location: [facility] Patient #: [id number] DOB: 06-03-1941 Undefined / Language: Undefined / Race: Undefined Female  History of Present Illness   The patient is a 74 year old female who presents with breast cancer. Her PCP is Dr. Olin Hauser [facility]). She sees Dr. Lawson Fiscal for GYN. [facility] is at the [facility]. Oncology - Lindi Adie and Pablo Ledger. Her daughter, Ellieanna Funderburg, is with her.   She went for her routine mammogram. She had a open benign biopsy of the right breast in 1993 and a core biopsy of left breast in 2015 which was benign. She had no fam hx of breast cancer. She is on Premarin and will stop this.   She had a mammogram on 4/22/22016 at [facility]. It showed Small area of architectural distortion 11 o'clock region of the right breast. While this could be secondary to postsurgical scarring from prior surgery in 1993, this is not definite. Ultrasound findings are very subtle. Biopsy using tomosynthesis for guidance is recommended for pathological evaluation. 2. Bilateral simple cysts.    She had a right breast biopsy on 04/16/2015 ([id number]) which showed invasive mammary ca (?lobular), ER/PR - pos, Her2Neu - neg, Ki67 - 10%. The tumor appears to be about 7 mm.   I discussed the options for breast cancer treatment with the patient. I discussed a multidisciplinary approach to the treatment of breast cancer, which includes medical oncology and radiation oncology. I discussed the surgical options of lumpectomy vs. mastectomy. If mastectomy, there is the possibility of reconstruction. I discussed the options of lymph node biopsy. The treatment plan depends on the pathologic staging of the tumor and the patient's personal wishes. The risks of surgery include, but are not limited to, bleeding, infection, the need for further surgery, and nerve injury. She is a candidate for a right breast lumpectomy and right  axillary sentinel lymp node biopsy. Probable rad tx and antiestrogen.  Past medical history: 1. HTN 2. Synthroid 3. History of knee surgery - 2010 and 2012 for cartilage/meniscus repair. 4. colonosopy in 2011 in [place] 5. Was on Premarin - she will stop  Social history. Widowed in 2002. Boyfriend, Wilhemina Cash, not with her. Her daughter, Claris Pech, is with her. She has another daughter, who has had a stoke and is in a facility for support. She plans to work until September, then retire. She works at [employer] for [employer] in [place].  Other Problems Anderson Malta [place], RMA; 04/22/2015 10:14 AM) Asthma Bladder Problems Breast Cancer Hemorrhoids High blood pressure Lump In Breast Thyroid Disease  Past Surgical History Anderson Malta [place], RMA; 04/22/2015 10:14 AM) Appendectomy Breast Biopsy Right. multiple Hysterectomy (not due to cancer) - Partial Knee Surgery Bilateral. Oral Surgery Tonsillectomy  Diagnostic Studies History Anderson Malta [place], RMA; 04/22/2015 10:14 AM) Colonoscopy 1-5 years ago Mammogram within last year Pap Smear 1-5 years ago  Social History Anderson Malta [place], RMA; 04/22/2015 10:14 AM) Alcohol use Occasional alcohol use. Caffeine use Coffee. No drug use Tobacco use Never smoker.  Family History Anderson Malta [place], [place]; 04/22/2015 10:14 AM) Alcohol Abuse Family Members In General, Father. Arthritis Mother. Cerebrovascular Accident Daughter. Heart Disease Family Members In General, Mother. Hypertension Mother. Kidney Disease Brother. Migraine Headache Daughter. Prostate Cancer Brother, Family Members In General. Respiratory Condition Mother. Thyroid problems Mother.  Pregnancy / Birth History Anderson Malta [place], [place]; 04/22/2015 10:14 AM) Age at menarche 44 years. Age of menopause 53-50 Contraceptive History Oral contraceptives. Gravida 3 Irregular periods Maternal age 63-25 Para  2  Review of  Systems Anderson Malta [place] RMA; 04/22/2015 10:14 AM) General Not Present- Appetite Loss, Chills, Fatigue, Fever, Night Sweats, Weight Gain and Weight Loss. Skin Not Present- Change in Wart/Mole, Dryness, Hives, Jaundice, New Lesions, Non-Healing Wounds, Rash and Ulcer. HEENT Present- Seasonal Allergies and Wears glasses/contact lenses. Not Present- Earache, Hearing Loss, Hoarseness, Nose Bleed, Oral Ulcers, Ringing in the Ears, Sinus Pain, Sore Throat, Visual Disturbances and Yellow Eyes. Respiratory Present- Wheezing. Not Present- Bloody sputum, Chronic Cough, Difficulty Breathing and Snoring. Cardiovascular Present- Leg Cramps and Swelling of Extremities. Not Present- Chest Pain, Difficulty Breathing Lying Down, Palpitations, Rapid Heart Rate and Shortness of Breath. Gastrointestinal Not Present- Abdominal Pain, Bloating, Bloody Stool, Change in Bowel Habits, Chronic diarrhea, Constipation, Difficulty Swallowing, Excessive gas, Gets full quickly at meals, Hemorrhoids, Indigestion, Nausea, Rectal Pain and Vomiting. Female Genitourinary Not Present- Frequency, Nocturia, Painful Urination, Pelvic Pain and Urgency. Musculoskeletal Not Present- Back Pain, Joint Pain, Joint Stiffness, Muscle Pain, Muscle Weakness and Swelling of Extremities. Neurological Not Present- Decreased Memory, Fainting, Headaches, Numbness, Seizures, Tingling, Tremor, Trouble walking and Weakness. Psychiatric Present- Anxiety. Not Present- Bipolar, Change in Sleep Pattern, Depression, Fearful and Frequent crying. Endocrine Not Present- Cold Intolerance, Excessive Hunger, Hair Changes, Heat Intolerance, Hot flashes and New Diabetes. Hematology Not Present- Easy Bruising, Excessive bleeding, Gland problems, HIV and Persistent Infections.  Physical Exam  The physical exam findings are as follows: Note:General: WN older WF alert and generally healthy appearing. HEENT: Normal. Pupils equal.  Neck: Supple. No mass. No thyroid  mass. Lymph Nodes: No supraclavicular or cervical or axillary nodes.  Lungs: Clear to auscultation and symmetric breath sounds. Heart: RRR. No murmur or rub.  Breasts: Right - scar at 9 o'clock. Biopsy site at 11 o'clock. No mass Left - no mass  Abdomen: Soft. No mass. No tenderness. No hernia. Normal bowel sounds. No abdominal scars. Pfannenstiel incision.  Extremities: Good strength and ROM in upper and lower extremities.  Neurologic: Grossly intact to motor and sensory function. Psychiatric: Has normal mood and affect. Behavior is normal.  Assessment & Plan  1.  BREAST CANCER, STAGE 1, RIGHT (174.9  C50.911)  Story: She had a right breast biopsy on 04/16/2015 ([id number]) which showed invasive mammary ca (?lobular), ER/PR - pos, Her2Neu - neg, Ki67 - 10%. The tumor appears to be about 7 mm.   Seen at [facility] clinic with Drs. Christel Mormon Impression: Plan:  Right breast lumpectomy and sentinel lymph node biopsy.   Probable radation vs probable antiestrogen vs both  Current Plans: Schedule for Surgery  Alphonsa Overall, MD, [facility] Pager: [phone] Office phone:  [phone]

## 2015-05-08 ENCOUNTER — Ambulatory Visit (HOSPITAL_BASED_OUTPATIENT_CLINIC_OR_DEPARTMENT_OTHER)
Admission: RE | Admit: 2015-05-08 | Discharge: 2015-05-08 | Disposition: A | Discharge status: Home / Self Care | Payer: Medicare Other | Source: Ambulatory Visit | Attending: Surgery | Admitting: Surgery

## 2015-05-08 ENCOUNTER — Ambulatory Visit (HOSPITAL_BASED_OUTPATIENT_CLINIC_OR_DEPARTMENT_OTHER): Payer: Medicare Other | Admitting: Anesthesiology

## 2015-05-08 ENCOUNTER — Encounter (HOSPITAL_COMMUNITY)
Admission: RE | Admit: 2015-05-08 | Discharge: 2015-05-08 | Disposition: A | Discharge status: Home / Self Care | Payer: Medicare Other | Source: Ambulatory Visit | Attending: Surgery | Admitting: Surgery

## 2015-05-08 ENCOUNTER — Ambulatory Visit
Admission: RE | Admit: 2015-05-08 | Discharge: 2015-05-08 | Disposition: A | Discharge status: Home / Self Care | Payer: Medicare Other | Source: Ambulatory Visit | Attending: Surgery | Admitting: Surgery

## 2015-05-08 ENCOUNTER — Encounter (HOSPITAL_BASED_OUTPATIENT_CLINIC_OR_DEPARTMENT_OTHER): Payer: Self-pay | Admitting: *Deleted

## 2015-05-08 ENCOUNTER — Encounter (HOSPITAL_BASED_OUTPATIENT_CLINIC_OR_DEPARTMENT_OTHER)
Admission: RE | Disposition: A | Discharge status: Home / Self Care | Payer: Self-pay | Source: Ambulatory Visit | Attending: Surgery

## 2015-05-08 DIAGNOSIS — C50911 Malignant neoplasm of unspecified site of right female breast: Secondary | ICD-10-CM

## 2015-05-08 DIAGNOSIS — C50411 Malignant neoplasm of upper-outer quadrant of right female breast: Secondary | ICD-10-CM | POA: Diagnosis not present

## 2015-05-08 DIAGNOSIS — I10 Essential (primary) hypertension: Secondary | ICD-10-CM | POA: Diagnosis not present

## 2015-05-08 DIAGNOSIS — J45909 Unspecified asthma, uncomplicated: Secondary | ICD-10-CM | POA: Diagnosis not present

## 2015-05-08 HISTORY — PX: BREAST LUMPECTOMY WITH RADIOACTIVE SEED AND SENTINEL LYMPH NODE BIOPSY: SHX6550

## 2015-05-08 HISTORY — PX: BREAST LUMPECTOMY: SHX2

## 2015-05-08 SURGERY — BREAST LUMPECTOMY WITH RADIOACTIVE SEED AND SENTINEL LYMPH NODE BIOPSY
Anesthesia: Regional | Site: Breast | Laterality: Right

## 2015-05-08 MED ORDER — MIDAZOLAM HCL 2 MG/2ML IJ SOLN
1.0000 mg | INTRAMUSCULAR | Status: DC | PRN
  Administered 2015-05-08: 1 mg via INTRAVENOUS

## 2015-05-08 MED ORDER — PROPOFOL 10 MG/ML IV BOLUS
INTRAVENOUS | Status: AC
  Filled 2015-05-08: qty 20

## 2015-05-08 MED ORDER — TECHNETIUM TC 99M SULFUR COLLOID FILTERED
1.0000 | Freq: Once | INTRAVENOUS | Status: AC | PRN
  Administered 2015-05-08: 1 via INTRADERMAL

## 2015-05-08 MED ORDER — FENTANYL CITRATE (PF) 100 MCG/2ML IJ SOLN
INTRAMUSCULAR | Status: DC | PRN
  Administered 2015-05-08 (×3): 25 ug via INTRAVENOUS
  Administered 2015-05-08: 100 ug via INTRAVENOUS
  Administered 2015-05-08: 25 ug via INTRAVENOUS

## 2015-05-08 MED ORDER — BUPIVACAINE-EPINEPHRINE 0.25% -1:200000 IJ SOLN
INTRAMUSCULAR | Status: DC | PRN
  Administered 2015-05-08: 30 mL

## 2015-05-08 MED ORDER — CEFAZOLIN SODIUM-DEXTROSE 2-3 GM-% IV SOLR
INTRAVENOUS | Status: AC
  Filled 2015-05-08: qty 50

## 2015-05-08 MED ORDER — MIDAZOLAM HCL 2 MG/2ML IJ SOLN
INTRAMUSCULAR | Status: AC
  Filled 2015-05-08: qty 2

## 2015-05-08 MED ORDER — FENTANYL CITRATE (PF) 100 MCG/2ML IJ SOLN
25.0000 ug | INTRAMUSCULAR | Status: DC | PRN
  Administered 2015-05-08: 25 ug via INTRAVENOUS
  Administered 2015-05-08: 50 ug via INTRAVENOUS

## 2015-05-08 MED ORDER — TRAMADOL HCL 50 MG PO TABS: 50.0000 mg | ORAL_TABLET | Freq: Four times a day (QID) | ORAL | Status: DC | PRN

## 2015-05-08 MED ORDER — BUPIVACAINE-EPINEPHRINE (PF) 0.5% -1:200000 IJ SOLN
INTRAMUSCULAR | Status: DC | PRN
  Administered 2015-05-08: 30 mL via PERINEURAL

## 2015-05-08 MED ORDER — FENTANYL CITRATE (PF) 100 MCG/2ML IJ SOLN
50.0000 ug | INTRAMUSCULAR | Status: DC | PRN
  Administered 2015-05-08: 50 ug via INTRAVENOUS

## 2015-05-08 MED ORDER — ONDANSETRON HCL 4 MG/2ML IJ SOLN: 4.0000 mg | Freq: Four times a day (QID) | INTRAMUSCULAR | Status: DC | PRN

## 2015-05-08 MED ORDER — OXYCODONE HCL 5 MG/5ML PO SOLN: 5.0000 mg | Freq: Once | ORAL | Status: DC | PRN

## 2015-05-08 MED ORDER — LACTATED RINGERS IV SOLN
INTRAVENOUS | Status: DC
  Administered 2015-05-08 (×2): via INTRAVENOUS

## 2015-05-08 MED ORDER — DEXAMETHASONE SODIUM PHOSPHATE 4 MG/ML IJ SOLN
INTRAMUSCULAR | Status: DC | PRN
  Administered 2015-05-08: 10 mg via INTRAVENOUS

## 2015-05-08 MED ORDER — SODIUM CHLORIDE 0.9 % IJ SOLN
INTRAMUSCULAR | Status: AC
  Filled 2015-05-08: qty 10

## 2015-05-08 MED ORDER — CHLORHEXIDINE GLUCONATE 4 % EX LIQD: 1.0000 "application " | Freq: Once | CUTANEOUS | Status: DC

## 2015-05-08 MED ORDER — BUPIVACAINE-EPINEPHRINE (PF) 0.5% -1:200000 IJ SOLN
INTRAMUSCULAR | Status: AC
  Filled 2015-05-08: qty 30

## 2015-05-08 MED ORDER — FENTANYL CITRATE (PF) 100 MCG/2ML IJ SOLN
INTRAMUSCULAR | Status: AC
  Filled 2015-05-08: qty 2

## 2015-05-08 MED ORDER — OXYCODONE HCL 5 MG PO TABS: 5.0000 mg | ORAL_TABLET | Freq: Once | ORAL | Status: DC | PRN

## 2015-05-08 MED ORDER — FENTANYL CITRATE (PF) 100 MCG/2ML IJ SOLN
INTRAMUSCULAR | Status: AC
  Filled 2015-05-08: qty 4

## 2015-05-08 MED ORDER — ONDANSETRON HCL 4 MG/2ML IJ SOLN
INTRAMUSCULAR | Status: DC | PRN
  Administered 2015-05-08: 4 mg via INTRAVENOUS

## 2015-05-08 MED ORDER — PROPOFOL 10 MG/ML IV BOLUS
INTRAVENOUS | Status: DC | PRN
  Administered 2015-05-08: 140 mg via INTRAVENOUS

## 2015-05-08 MED ORDER — METHYLENE BLUE 1 % INJ SOLN
INTRAMUSCULAR | Status: AC
  Filled 2015-05-08: qty 10

## 2015-05-08 MED ORDER — SODIUM CHLORIDE 0.9 % IJ SOLN
INTRAMUSCULAR | Status: DC | PRN
  Administered 2015-05-08: 1 mL

## 2015-05-08 MED ORDER — LIDOCAINE HCL (CARDIAC) 20 MG/ML IV SOLN
INTRAVENOUS | Status: DC | PRN
  Administered 2015-05-08: 60 mg via INTRAVENOUS

## 2015-05-08 MED ORDER — CEFAZOLIN SODIUM-DEXTROSE 2-3 GM-% IV SOLR
2.0000 g | INTRAVENOUS | Status: AC
  Administered 2015-05-08: 2 g via INTRAVENOUS

## 2015-05-08 MED ORDER — GLYCOPYRROLATE 0.2 MG/ML IJ SOLN: 0.2000 mg | Freq: Once | INTRAMUSCULAR | Status: DC | PRN

## 2015-05-08 MED ORDER — EPHEDRINE SULFATE 50 MG/ML IJ SOLN
INTRAMUSCULAR | Status: DC | PRN
  Administered 2015-05-08: 10 mg via INTRAVENOUS

## 2015-05-08 SURGICAL SUPPLY — 56 items
APL SKNCLS STERI-STRIP NONHPOA (GAUZE/BANDAGES/DRESSINGS)
BENZOIN TINCTURE PRP APPL 2/3 (GAUZE/BANDAGES/DRESSINGS) IMPLANT
BINDER BREAST LRG (GAUZE/BANDAGES/DRESSINGS) IMPLANT
BINDER BREAST MEDIUM (GAUZE/BANDAGES/DRESSINGS) IMPLANT
BINDER BREAST XLRG (GAUZE/BANDAGES/DRESSINGS) IMPLANT
BINDER BREAST XXLRG (GAUZE/BANDAGES/DRESSINGS) ×3 IMPLANT
BLADE SURG 15 STRL LF DISP TIS (BLADE) ×1 IMPLANT
BLADE SURG 15 STRL SS (BLADE) ×3
CANISTER SUC SOCK COL 7IN (MISCELLANEOUS) IMPLANT
CANISTER SUCT 1200ML W/VALVE (MISCELLANEOUS) ×3 IMPLANT
CHLORAPREP W/TINT 26ML (MISCELLANEOUS) ×3 IMPLANT
CLIP TI WIDE RED SMALL 6 (CLIP) ×3 IMPLANT
CLOSURE WOUND 1/2 X4 (GAUZE/BANDAGES/DRESSINGS)
COVER BACK TABLE 60X90IN (DRAPES) ×3 IMPLANT
COVER MAYO STAND STRL (DRAPES) ×3 IMPLANT
COVER PROBE W GEL 5X96 (DRAPES) ×3 IMPLANT
DECANTER SPIKE VIAL GLASS SM (MISCELLANEOUS) IMPLANT
DEVICE DUBIN W/COMP PLATE 8390 (MISCELLANEOUS) ×3 IMPLANT
DRAPE LAPAROSCOPIC ABDOMINAL (DRAPES) ×3 IMPLANT
DRAPE UTILITY XL STRL (DRAPES) ×3 IMPLANT
DRSG PAD ABDOMINAL 8X10 ST (GAUZE/BANDAGES/DRESSINGS) IMPLANT
ELECT COATED BLADE 2.86 ST (ELECTRODE) ×3 IMPLANT
ELECT REM PT RETURN 9FT ADLT (ELECTROSURGICAL) ×3
ELECTRODE REM PT RTRN 9FT ADLT (ELECTROSURGICAL) ×1 IMPLANT
GAUZE SPONGE 4X4 12PLY STRL (GAUZE/BANDAGES/DRESSINGS) ×3 IMPLANT
GLOVE BIO SURGEON STRL SZ 6.5 (GLOVE) ×2 IMPLANT
GLOVE BIO SURGEONS STRL SZ 6.5 (GLOVE) ×1
GLOVE BIOGEL PI IND STRL 6.5 (GLOVE) ×1 IMPLANT
GLOVE BIOGEL PI INDICATOR 6.5 (GLOVE) ×2
GLOVE ECLIPSE 6.5 STRL STRAW (GLOVE) ×3 IMPLANT
GLOVE ECLIPSE 7.0 STRL STRAW (GLOVE) ×3 IMPLANT
GLOVE SURG SIGNA 7.5 PF LTX (GLOVE) ×6 IMPLANT
GOWN STRL REUS W/ TWL LRG LVL3 (GOWN DISPOSABLE) ×2 IMPLANT
GOWN STRL REUS W/ TWL XL LVL3 (GOWN DISPOSABLE) ×1 IMPLANT
GOWN STRL REUS W/TWL LRG LVL3 (GOWN DISPOSABLE) ×6
GOWN STRL REUS W/TWL XL LVL3 (GOWN DISPOSABLE) ×3
KIT MARKER MARGIN INK (KITS) ×3 IMPLANT
LIQUID BAND (GAUZE/BANDAGES/DRESSINGS) ×3 IMPLANT
NDL SAFETY ECLIPSE 18X1.5 (NEEDLE) IMPLANT
NEEDLE HYPO 18GX1.5 SHARP (NEEDLE)
NEEDLE HYPO 25X1 1.5 SAFETY (NEEDLE) ×3 IMPLANT
NS IRRIG 1000ML POUR BTL (IV SOLUTION) ×3 IMPLANT
PACK BASIN DAY SURGERY FS (CUSTOM PROCEDURE TRAY) ×3 IMPLANT
PENCIL BUTTON HOLSTER BLD 10FT (ELECTRODE) ×3 IMPLANT
SHEET MEDIUM DRAPE 40X70 STRL (DRAPES) ×3 IMPLANT
SLEEVE SCD COMPRESS KNEE MED (MISCELLANEOUS) ×3 IMPLANT
SPONGE LAP 18X18 X RAY DECT (DISPOSABLE) ×6 IMPLANT
STRIP CLOSURE SKIN 1/2X4 (GAUZE/BANDAGES/DRESSINGS) IMPLANT
SUT MON AB 5-0 PS2 18 (SUTURE) ×3 IMPLANT
SUT VICRYL 3-0 CR8 SH (SUTURE) ×3 IMPLANT
SYR CONTROL 10ML LL (SYRINGE) ×6 IMPLANT
TOWEL OR 17X24 6PK STRL BLUE (TOWEL DISPOSABLE) ×3 IMPLANT
TOWEL OR NON WOVEN STRL DISP B (DISPOSABLE) ×3 IMPLANT
TUBE CONNECTING 20'X1/4 (TUBING) ×1
TUBE CONNECTING 20X1/4 (TUBING) ×2 IMPLANT
YANKAUER SUCT BULB TIP NO VENT (SUCTIONS) ×3 IMPLANT

## 2015-05-08 NOTE — Discharge Instructions (Signed)
[facility] - DISCHARGE INSTRUCTIONS TO PATIENT  Activity:  Driving - May drive in 2 or 3 days, if doing well   Lifting - No lifting more than 15 pounds for 1 week, then no limit.  Wound Care:   Leave bandage on until Sunday (5/29), then remove bandage and shower.  Diet:  As tolerated  Follow up appointment:  Call Dr. Pollie Friar office [facility]) at [phone] for an appointment in 2 to 3 weeks.  Medications and dosages:  Resume your home medications.  You have a prescription for:  Tramadol  Call Dr. Lucia Gaskins or his office  [phone]) if you have:  Temperature greater than 100.4,  Persistent nausea and vomiting,  Severe uncontrolled pain,  Redness, tenderness, or signs of infection (pain, swelling, redness, odor or green/yellow discharge around the site),  Difficulty breathing, headache or visual disturbances,  Any other questions or concerns you may have after discharge.  In an emergency, call 911 or go to an Emergency Department at a nearby hospital.    Post Anesthesia Home Care Instructions  Activity: Get plenty of rest for the remainder of the day. A responsible adult should stay with you for 24 hours following the procedure.  For the next 24 hours, DO NOT: -Drive a car -Paediatric nurse -Drink alcoholic beverages -Take any medication unless instructed by your physician -Make any legal decisions or sign important papers.  Meals: Start with liquid foods such as gelatin or soup. Progress to regular foods as tolerated. Avoid greasy, spicy, heavy foods. If nausea and/or vomiting occur, drink only clear liquids until the nausea and/or vomiting subsides. Call your physician if vomiting continues.  Special Instructions/Symptoms: Your throat may feel dry or sore from the anesthesia or the breathing tube placed in your throat during surgery. If this causes discomfort, gargle with warm salt water. The discomfort should disappear within 24 hours.  If  you had a scopolamine patch placed behind your ear for the management of post- operative nausea and/or vomiting:  1. The medication in the patch is effective for 72 hours, after which it should be removed.  Wrap patch in a tissue and discard in the trash. Wash hands thoroughly with soap and water. 2. You may remove the patch earlier than 72 hours if you experience unpleasant side effects which may include dry mouth, dizziness or visual disturbances. 3. Avoid touching the patch. Wash your hands with soap and water after contact with the patch.

## 2015-05-08 NOTE — Transfer of Care (Signed)
Immediate Anesthesia Transfer of Care Note  Patient: Debra Kelly  Procedure(s) Performed: Procedure(s): RIGHT BREAST LUMPECTOMY WITH RADIOACTIVE SEED AND RIGHT AXILLARY SENTINEL LYMPH NODE BIOPSY (Right)  Patient Location: PACU  Anesthesia Type:GA combined with regional for post-op pain  Level of Consciousness: awake, alert  and oriented  Airway & Oxygen Therapy: Patient Spontanous Breathing and Patient connected to face mask oxygen  Post-op Assessment: Report given to RN and Post -op Vital signs reviewed and stable  Post vital signs: Reviewed and stable  Last Vitals:  Filed Vitals:   05/08/15 1145  BP: 113/52  Pulse: 89  Temp:   Resp: 19    Complications: No apparent anesthesia complications

## 2015-05-08 NOTE — Anesthesia Preprocedure Evaluation (Signed)
Anesthesia Evaluation  Patient identified by MRN, date of birth, ID band Patient awake    Reviewed: Allergy & Precautions, NPO status , Patient's Chart, lab work & pertinent test results  Airway Mallampati: II   Neck ROM: full    Dental   Pulmonary neg pulmonary ROS,  breath sounds clear to auscultation        Cardiovascular hypertension, Rhythm:regular Rate:Normal     Neuro/Psych    GI/Hepatic   Endo/Other  Hypothyroidism obese  Renal/GU      Musculoskeletal  (+) Arthritis -,   Abdominal   Peds  Hematology   Anesthesia Other Findings   Reproductive/Obstetrics                             Anesthesia Physical Anesthesia Plan  ASA: II  Anesthesia Plan: General and Regional   Post-op Pain Management: MAC Combined w/ Regional for Post-op pain   Induction: Intravenous  Airway Management Planned: LMA  Additional Equipment:   Intra-op Plan:   Post-operative Plan:   Informed Consent: I have reviewed the patients History and Physical, chart, labs and discussed the procedure including the risks, benefits and alternatives for the proposed anesthesia with the patient or authorized representative who has indicated his/her understanding and acceptance.     Plan Discussed with: CRNA, Anesthesiologist and Surgeon  Anesthesia Plan Comments:         Anesthesia Quick Evaluation

## 2015-05-08 NOTE — Interval H&P Note (Signed)
History and Physical Interval Note:  05/08/2015 11:27 AM  Debra Kelly  has presented today for surgery, with the diagnosis of right breast cancer  The various methods of treatment have been discussed with the patient and family.  Her daughter, rashelle, [place] boyfriend, Rush Landmark, is at the bedside.  Her seed is located in the UOQ of the right breast.  After consideration of risks, benefits and other options for treatment, the patient has consented to  Procedure(s): RIGHT BREAST LUMPECTOMY WITH RADIOACTIVE SEED AND RIGHT AXILLARY SENTINEL LYMPH NODE BIOPSY (Right) as a surgical intervention .  The patient's history has been reviewed, patient examined, no change in status, stable for surgery.  I have reviewed the patient's chart and labs.  Questions were answered to the patient's satisfaction.     Angeles Paolucci H

## 2015-05-08 NOTE — Anesthesia Postprocedure Evaluation (Signed)
Anesthesia Post Note  Patient: Debra Kelly  Procedure(s) Performed: Procedure(s) (LRB): RIGHT BREAST LUMPECTOMY WITH RADIOACTIVE SEED AND RIGHT AXILLARY SENTINEL LYMPH NODE BIOPSY (Right)  Anesthesia type: General  Patient location: PACU  Post pain: Pain level controlled and Adequate analgesia  Post assessment: Post-op Vital signs reviewed, Patient's Cardiovascular Status Stable, Respiratory Function Stable, Patent Airway and Pain level controlled  Last Vitals:  Filed Vitals:   05/08/15 1507  BP: 124/62  Pulse: 93  Temp: 36.7 C  Resp: 18    Post vital signs: Reviewed and stable  Level of consciousness: awake, alert  and oriented  Complications: No apparent anesthesia complications

## 2015-05-08 NOTE — Anesthesia Procedure Notes (Addendum)
Anesthesia Regional Block:  Pectoralis block  Pre-Anesthetic Checklist: ,, timeout performed, Correct Patient, Correct Site, Correct Laterality, Correct Procedure, Correct Position, site marked, Risks and benefits discussed,  Surgical consent,  Pre-op evaluation,  At surgeon's request and post-op pain management  Laterality: Right  Prep: chloraprep       Needles:  Injection technique: Single-shot  Needle Type: Echogenic Needle     Needle Length: 9cm 9 cm Needle Gauge: 21 and 21 G    Additional Needles:  Procedures: ultrasound guided (picture in chart) Pectoralis block Narrative:  Start time: 05/08/2015 11:35 AM End time: 05/08/2015 11:44 AM Injection made incrementally with aspirations every 5 mL.  Performed by: Personally  Anesthesiologist: HODIERNE, ADAM  Additional Notes: Pt tolerated the procedure well.   Procedure Name: LMA Insertion Date/Time: 05/08/2015 12:02 PM Performed by: Lyndee Leo Pre-anesthesia Checklist: Patient identified, Emergency Drugs available, Suction available and Patient being monitored Patient Re-evaluated:Patient Re-evaluated prior to inductionOxygen Delivery Method: Circle System Utilized Preoxygenation: Pre-oxygenation with 100% oxygen Intubation Type: IV induction Ventilation: Mask ventilation without difficulty LMA: LMA inserted LMA Size: 4.0 Number of attempts: 1 Airway Equipment and Method: Bite block Placement Confirmation: positive ETCO2 Tube secured with: Tape Dental Injury: Teeth and Oropharynx as per pre-operative assessment

## 2015-05-08 NOTE — Op Note (Signed)
05/08/2015  1:55 PM  PATIENT:  Debra Kelly DOB: 05/16/1941 MRN: [id number]  PREOP DIAGNOSIS:  right breast cancer  POSTOP DIAGNOSIS:   Right breast cancer, 11 o'clock position (T1, N0)  PROCEDURE:   Procedure(s):  RIGHT BREAST LUMPECTOMY WITH RADIOACTIVE SEED AND RIGHT AXILLARY SENTINEL LYMPH NODE BIOPSY (through single incision), Injection of peri areolar area of breast with methylene blue (1.0  cc)  SURGEON:   Alphonsa Overall, M.D.  ANESTHESIA:   general  Anesthesiologist: Albertha Ghee, MD CRNA: Lyndee Leo, CRNA  General  EBL:  minimal  ml  DRAINS: none   LOCAL MEDICATIONS USED:   30 cc 1/4% marcaine,  Right pectoral block  SPECIMEN:   Right breast lumpectomy, inferior margin (suture anterior), and right axillary sentinel lymph node (counts 1600, background < 5, not blue)  COUNTS CORRECT:  YES  INDICATIONS FOR PROCEDURE:  Debra Kelly is a 74 y.o. (DOB: 1941/09/17) white  female whose primary care physician is Olin Hauser, MD and comes for right breast lumpectomy and right axillary sentinel lymph node biopsy.   The patient was seen at the Breast multidisciplinary clinic with Drs. Christel Mormon.   The options for breast cancer treatment have been discussed with the patient. She elected to proceed with lumpectomy and axillary sentinel lymph node.     The indications and potential complications of surgery were explained to the patient. Potential complications include, but are not limited to, bleeding, infection, the need for further surgery, and nerve injury.     She had a I131 seed placed on 05/06/2015 in her right breast at [facility].  I confirmed the presence of the I131 seed in the pre op area using the Neoprobe.  The seed is in the 11 o'clock position of the right breast.   In the holding area, her right areola was injected with 1 millicurie of Technitium Sulfur Colloid.  OPERATIVE NOTE:   The patient was taken to room # 8 at [facility] where she  underwent a general anesthesia  supervised by Anesthesiologist: Albertha Ghee, MD CRNA: Lyndee Leo, CRNA. Her right breast and axilla were prepped with  ChloraPrep and sterilely draped.    A time-out and the surgical check list was reviewed.    I injected about 1.0 mL of 40% methylene blue around her right areola.   I turned attention to the cancer which was about at the 11 o'clock position of the right breast.   I used the Neoprobe to identify the I131 seed.  I tried to excise an area around the tumor of at least 1 cm.    I excised this block of breast tissue approximately 4 cm by 4 cm  in diameter.   I painted the lumpectomy specimen with the 6 color paint kit and did a specimen mammogram which confirmed the mass, clip, and the seed were all in the right position in the specimen.  The specimen was sent to pathology who called back to confirm that they have the seed and the specimen.   It looked like she had breast tissue that came inferiorly.  I excised an inferior margin.  I painted the inferior margin and placed a suture anteriorly.   I then started the right axillary sentinel lymph node biopsy. Her breast biopsy cavity overlay the axilla and I could go through the back wall of the breast biopsy cavity.  So this surgery was done through a single incision.  I found a hot area at  the junction of the breast and the pectoralis major muscle. I cut down and  identified a hot node that had counts of 1,600 and the background has 5 counts. The lymph node was not blue. I checked her internal mammary nodes and supraclavicular nodes with the neoprobe and found no other hot area. The axillary node was then sent to pathology.    I then irrigated the wound with saline. I infiltrated approximately 30 mL of 1/4% marcaine between the incisions.  I placed 6 clips to mark biopsy cavity, at 12, 3, 6, and 9 o'clock. Two clips were placed on the pectoralis major.   I then closed all the wounds in layers using 3-0  Vicryl sutures for the deep layer. At the skin, I closed the incisions with a 5-0 Monocryl suture. The incisions were then painted with LiquiBand.  She had gauze place over the wounds and placed in a breast binder.   The patient tolerated the procedure well, was transported to the recovery room in good condition. Sponge and needle count were correct at the end of the case.   Final pathology is pending.   Alphonsa Overall, MD, [facility] Pager: [phone] Office phone:  [phone]

## 2015-05-08 NOTE — Progress Notes (Signed)
Assisted Dr. Marcie Bal with right, ultrasound guided, pectoralis block. Side rails up, monitors on throughout procedure. See vital signs in flow sheet. Tolerated Procedure well. also assisted nuc med tech [phone] with nuc med inj tol procedure well.

## 2015-05-12 ENCOUNTER — Encounter (HOSPITAL_BASED_OUTPATIENT_CLINIC_OR_DEPARTMENT_OTHER): Payer: Self-pay | Admitting: Surgery

## 2015-05-18 NOTE — Assessment & Plan Note (Signed)
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 Oncotype Dx testing to determine benefit to chemo 2. Adjuvant XRT 3. Followed by adjuvant antiestrogen therapy  RTC after Oncotype results are available 

## 2015-05-19 ENCOUNTER — Encounter: Payer: Self-pay | Admitting: *Deleted

## 2015-05-19 ENCOUNTER — Telehealth: Payer: Self-pay | Admitting: Hematology and Oncology

## 2015-05-19 ENCOUNTER — Ambulatory Visit (HOSPITAL_BASED_OUTPATIENT_CLINIC_OR_DEPARTMENT_OTHER): Payer: Medicare Other | Admitting: Hematology and Oncology

## 2015-05-19 VITALS — BP 141/69 | HR 74 | Temp 98.0°F | Resp 18 | Ht 64.0 in | Wt 197.9 lb

## 2015-05-19 DIAGNOSIS — Z170 Estrogen receptor positive status [ER+]: Secondary | ICD-10-CM | POA: Diagnosis not present

## 2015-05-19 DIAGNOSIS — C50411 Malignant neoplasm of upper-outer quadrant of right female breast: Secondary | ICD-10-CM | POA: Diagnosis present

## 2015-05-19 MED ORDER — ANASTROZOLE 1 MG PO TABS: 1.0000 mg | ORAL_TABLET | Freq: Every day | ORAL | Status: DC

## 2015-05-19 NOTE — Progress Notes (Signed)
Spoke with patient at her post op visit with Dr. Lindi Adie.  She states she is doing great post op.  She has an appointment with Dr. Pablo Ledger on Thursday. She will starting back to work on Monday. Encouraged her to call with any needs or concerns.

## 2015-05-19 NOTE — Telephone Encounter (Signed)
Gave avs & calendar for September °

## 2015-05-19 NOTE — Progress Notes (Signed)
Patient Care Team: Olin Hauser, MD as PCP - General (Family Medicine) Alphonsa Overall, MD as Consulting Physician (General Surgery) Nicholas Lose, MD as Consulting Physician (Hematology and Oncology) Thea Silversmith, MD as Consulting Physician (Radiation Oncology) Mauro Kaufmann, RN as Registered Nurse Rockwell Germany, RN as Registered Nurse Holley Bouche, NP as Nurse Practitioner (Nurse Practitioner)  DIAGNOSIS: Breast cancer of upper-outer quadrant of right female breast   Staging form: Breast, AJCC 7th Edition     Clinical stage from 04/22/2015: Stage IA (T1b, N0, M0) - Unsigned       Staging comments: Staged at breast conference on 5.11.16      Pathologic stage from 05/13/2015: Stage IA (T1c, N0, cM0) - Signed by Enid Cutter, MD on 05/18/2015       Staging comments: Staged on final lumpectomy specimen by Dr. Lyndon Code    SUMMARY OF ONCOLOGIC HISTORY:   Breast cancer of upper-outer quadrant of right female breast   04/03/2015 Mammogram Right breast architectural distortion 11:00 position 7 mm by ultrasound, biopsied by stereotactic approach.   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 She is healing very well from the surgery does not have any pain or discomfort she was Tylenol occasionally.  REVIEW OF SYSTEMS:   Constitutional: Denies fevers, chills or abnormal weight loss Eyes: Denies blurriness of vision Ears, nose, mouth, throat, and face: Denies  mucositis or sore throat Respiratory: Denies cough, dyspnea or wheezes Cardiovascular: Denies palpitation, chest discomfort or lower extremity swelling Gastrointestinal:  Denies nausea, heartburn or change in bowel habits Skin: Denies abnormal skin rashes Lymphatics: Denies new lymphadenopathy or easy bruising Neurological:Denies numbness, tingling or new weaknesses Behavioral/Psych: Mood is stable, no new changes  Breast: Mild breast discomfort All other systems were reviewed with the patient and are negative.  I have reviewed the past medical history, past surgical history, social history and family history with the patient and they are unchanged from previous note.  ALLERGIES:  is allergic to shellfish allergy; celebrex; claritin; adhesive; barbiturates; and codeine.  MEDICATIONS:  Current Outpatient Prescriptions  Medication Sig Dispense Refill  . aspirin 81 MG tablet Take 81 mg by mouth daily.    . Calcium Carbonate (CALCIUM 600 PO) Take by mouth daily.    . Cholecalciferol (VITAMIN D PO) Take 600 mg by mouth daily.    Marland Kitchen levothyroxine (SYNTHROID, LEVOTHROID) 175 MCG tablet Take 175 mcg by mouth daily before breakfast.    . lisinopril (PRINIVIL,ZESTRIL) 10 MG tablet Take 10 mg by mouth every morning.    . Magnesium 500 MG CAPS Take by mouth daily.    Marland Kitchen oxybutynin (DITROPAN) 5 MG tablet Take 5 mg by mouth daily.     . traMADol (ULTRAM) 50 MG tablet Take 1-2 tablets (50-100 mg total) by mouth every 6 (six) hours as needed. 30 tablet 1   No current facility-administered medications for this visit.    PHYSICAL EXAMINATION: ECOG PERFORMANCE STATUS: 1 - Symptomatic but completely ambulatory  Filed Vitals:   05/19/15 1052  BP: 141/69  Pulse: 74  Temp: 98 F (36.7 C)  Resp: 18   Filed Weights   05/19/15 1052  Weight: 197 lb 14.4 oz (89.767 kg)    GENERAL:alert, no distress and comfortable SKIN: skin color, texture, turgor are normal, no rashes or significant lesions EYES:  normal, Conjunctiva are pink and non-injected, sclera clear OROPHARYNX:no exudate, no erythema and lips, buccal mucosa, and tongue normal  NECK: supple, thyroid normal size, non-tender, without nodularity LYMPH:  no palpable lymphadenopathy in the cervical, axillary or inguinal LUNGS: clear to auscultation and percussion with normal breathing effort HEART: regular rate & rhythm and no murmurs and no lower extremity edema ABDOMEN:abdomen soft, non-tender and normal bowel sounds Musculoskeletal:no cyanosis of digits and no clubbing  NEURO: alert & oriented x 3 with fluent speech, no focal motor/sensory deficits  LABORATORY DATA:  I have reviewed the data as listed   Chemistry      Component Value Date/Time   NA 142 04/22/2015 0804   NA 142 10/16/2013 0537   K 4.2 04/22/2015 0804   K 3.7 10/16/2013 0537   CL 106 10/16/2013 0537   CO2 26 04/22/2015 0804   CO2 30 10/16/2013 0537   BUN 15.1 04/22/2015 0804   BUN 6 10/16/2013 0537   CREATININE 0.7 04/22/2015 0804   CREATININE 0.70 10/16/2013 0537      Component Value Date/Time   CALCIUM 9.0 04/22/2015 0804   CALCIUM 9.0 10/16/2013 0537   ALKPHOS 67 04/22/2015 0804   AST 13 04/22/2015 0804   ALT 13 04/22/2015 0804   BILITOT 0.50 04/22/2015 0804       Lab Results  Component Value Date   WBC 5.2 04/22/2015   HGB 13.2 04/22/2015   HCT 40.4 04/22/2015   MCV 92.9 04/22/2015   PLT 259 04/22/2015   NEUTROABS 2.9 04/22/2015   ASSESSMENT & PLAN:  Breast cancer of upper-outer quadrant of right female breast Right breast lumpectomy 05/08/15: 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 Oncotype Dx testing to determine benefit to chemo 2. Adjuvant XRT 3. Followed by adjuvant antiestrogen therapy  RTC after Oncotype results are available I discussed with her briefly about antiestrogen therapy  with anastrozole. I provided her with a paper prescription with anticipation that she would not need systemic chemotherapy and will go on to receive radiation. She will start antiestrogen therapy 2 weeks after completion of radiation.  Anastrozole counseling:We discussed the risks and benefits of anti-estrogen therapy with aromatase inhibitors. These include but not limited to insomnia, hot flashes, mood changes, vaginal dryness, bone density loss, and weight gain. Although rare, serious side effects including endometrial cancer, risk of blood clots were also discussed. We strongly believe that the benefits far outweigh the risks. Patient understands these risks and consented to starting treatment. Planned treatment duration is 5 years.  No orders of the defined types were placed in this encounter.   The patient has a good understanding of the overall plan. she agrees with it. she will call with any problems that may develop before the next visit here.   Rulon Eisenmenger, MD

## 2015-05-20 ENCOUNTER — Encounter: Payer: Self-pay | Admitting: Radiation Oncology

## 2015-05-20 ENCOUNTER — Telehealth: Payer: Self-pay | Admitting: *Deleted

## 2015-05-20 NOTE — Telephone Encounter (Signed)
Received order from Dr. Lindi Adie for oncotype testing. Requisition sent to pathology Received by Tammy. F/u with Dr. Pablo Ledger cancelled until after results are back.

## 2015-05-20 NOTE — Progress Notes (Signed)
Location of Breast Cancer: Right Breast Upper Outer Quadrant 11 o'clock position Histology per Pathology Report: 04/16/15:Diagnosis Breast, right, needle core biopsy, posterior midline- INVASIVE AND IN SITU MAMMARY CARCINOMA.  Receptor Status: ER(+100%), PR (+100%), Her2-neu (neg KI-67 10%)  Did patient present with symptoms (if so, please note symptoms) or was this found on screening mammography?: mammogram screening 04/03/15   Past/Anticipated interventions by surgeon, if IOP:[id number] 05/08/15: 1. Breast, lumpectomy, Right radioactive seed and right axillary sentinel  Lymph node biopsy Right- INVASIVE LOBULAR CARCINOMA, GRADE 2/3, SPANNING 1.3 CM.- LOBULAR CARCINOMA IN SITU.- THE SURGICAL RESECTION MARGINS ARE NEGATIVE FOR INVASIVE CARCINOMA.- SEE ONCOLOGY TABLE BELOW. 2. Breast, excision, Right inferior margin- LOBULAR CARCINOMA IN SITU.- SEE COMMENT. 3. Lymph node, sentinel, biopsy, Right axillary- THERE IS NO EVIDENCE OF CARCINOMA IN 1 OF 1 LYMPH NODE (0/1). Dr. Misty Stanley  Past/Anticipated interventions by medical oncology, if any: Chemotherapy Seen in Breast Clinic 04/22/15, last office visit with Dr. Lucien Mons 05/19/15, Oncotype testing   Lymphedema issues, if any:    Pain issues, if any:   SAFETY ISSUES:  Prior radiation? NO  Pacemaker/ICD? NO  Possible current pregnancy? N/A  Is the patient on methotrexate? NO  Current Complaints / other details:  Widowed,former smoker-cigars, 1 glass wine weekly, No drug use, menarche age 73 GxP2,1st live birth age 66, birth control pills 6 years,she was exposed to fertility medications or HRT,menopause  19-50 age   2 brothers prostate cancer,  no family hx of breast/gyn/gi cancer,    Diagnosis 02/18/14:Breast, left, needle core biopsy, 9 o'clock- FIBROCYSTIC CHANGES.- NO ATYPIA, HYPERPLASIA, OR MALIGNANCY IDENTIFIED. History :Breast surgery  right breast lumpectomy=benign, Allergies: Shellfish=SOB,Claritin, Adhesive  tape;,Barbituates,Codeine= all rash, Celebrex =makes her feel funny Rebecca Eaton, RN 05/20/2015,9:33 AM

## 2015-05-21 ENCOUNTER — Ambulatory Visit: Admission: RE | Admit: 2015-05-21 | Payer: Medicare Other | Source: Ambulatory Visit

## 2015-05-21 ENCOUNTER — Ambulatory Visit
Admission: RE | Admit: 2015-05-21 | Discharge: 2015-05-21 | Disposition: A | Discharge status: Home / Self Care | Payer: Medicare Other | Source: Ambulatory Visit | Attending: Radiation Oncology | Admitting: Radiation Oncology

## 2015-05-21 DIAGNOSIS — Z510 Encounter for antineoplastic radiation therapy: Secondary | ICD-10-CM | POA: Insufficient documentation

## 2015-05-21 DIAGNOSIS — C50411 Malignant neoplasm of upper-outer quadrant of right female breast: Secondary | ICD-10-CM | POA: Insufficient documentation

## 2015-05-21 HISTORY — DX: Allergy, unspecified, initial encounter: T78.40XA

## 2015-05-28 ENCOUNTER — Encounter (HOSPITAL_COMMUNITY): Payer: Self-pay

## 2015-05-29 ENCOUNTER — Telehealth: Payer: Self-pay | Admitting: *Deleted

## 2015-05-29 NOTE — Telephone Encounter (Signed)
Received oncotype score of 14. Copy placed on Dr. Geralyn Flash desk. Original to HIM for scanning.

## 2015-05-29 NOTE — Telephone Encounter (Signed)
Patient called requesting oncotype results.  Informed her of these results and she has her appointment with Dr. Pablo Ledger.

## 2015-06-03 ENCOUNTER — Ambulatory Visit
Admission: RE | Admit: 2015-06-03 | Discharge: 2015-06-03 | Disposition: A | Discharge status: Home / Self Care | Payer: Medicare Other | Source: Ambulatory Visit | Attending: Radiation Oncology | Admitting: Radiation Oncology

## 2015-06-03 VITALS — BP 132/64 | HR 86 | Temp 98.2°F | Resp 20 | Wt 200.3 lb

## 2015-06-03 DIAGNOSIS — C50411 Malignant neoplasm of upper-outer quadrant of right female breast: Secondary | ICD-10-CM | POA: Diagnosis not present

## 2015-06-03 DIAGNOSIS — Z510 Encounter for antineoplastic radiation therapy: Secondary | ICD-10-CM | POA: Diagnosis present

## 2015-06-03 NOTE — Progress Notes (Signed)
Please see the Nurse Progress Note in the MD Initial Consult Encounter for this patient. 

## 2015-06-03 NOTE — Progress Notes (Signed)
   Department of Radiation Oncology  Phone:  [phone] Fax:        [phone]   Name: Debra Kelly MRN: [id number]  DOB: [birth year]  Date: 06/03/2015  Follow Up Visit Note  Diagnosis: Breast cancer of upper-outer quadrant of right female breast   Staging form: Breast, AJCC 7th Edition     Clinical stage from 04/22/2015: Stage IA (T1b, N0, M0) - Unsigned       Staging comments: Staged at breast conference on 5.11.16      Pathologic stage from 05/13/2015: Stage IA (T1c, N0, cM0) - Signed by Enid Cutter, MD on 05/18/2015       Staging comments: Staged on final lumpectomy specimen by Dr. Lyndon Code   Interval History: Debra Kelly presents today for routine followup.  She had her lumpectomy and sentinel node biopsy on 05/08/15. This revealed a 1.3 cm Invasive Lobular Carcinoma with 0/1 positive sentinel nodes. Her margins were negative.  An Oncotype was sent which was low and the patient will not receive chemotherapy. She met with Dr. Lindi Adie and discussed anti-estrogen treatment. She presents today for consideration of radiation therapy. She has healed up well from surgery. She would like to delay radiation until she return from trip of July 4. She would also like to work if possible during treatment.   Physical Exam:  Filed Vitals:   06/03/15 1309  BP: 132/64  Pulse: 86  Temp: 98.2 F (36.8 C)  Resp: 20  Weight: 200 lb 4.8 oz (90.855 kg)  She has a healing incision of the upper outer quadrant of the right breast. There are no signs of infection.  IMPRESSION: Debra Kelly is a 74 y.o. female with a T1cN0 Invasive Lobular Carcinoma    PLAN:  I spoke to the patient today regarding her diagnosis and options for treatment. We discussed the equivalence in terms of survival and local failure between mastectomy and breast conservation. We discussed the role of radiation in decreasing local failures in patients who undergo lumpectomy. We discussed the process of simulation and the placement tattoos. We discussed 5  weeks of treatment as an outpatient. We discussed the possibility of asymptomatic lung damage. We discussed the low likelihood of secondary malignancies. We discussed the possible side effects including but not limited to skin redness, fatigue, permanent skin darkening, and breast swelling.  She signed informed consent.   She will be scheduled for simulation on July 12.  This document serves as a record of services personally performed by Thea Silversmith , MD. It was created on her behalf by Jeralene Peters, a trained medical scribe. The creation of this record is based on the scribe's personal observations and the provider's statements to them. This document has been checked and approved by the attending provider.     Thea Silversmith, MD

## 2015-06-03 NOTE — Progress Notes (Deleted)
.  nurs

## 2015-06-09 ENCOUNTER — Other Ambulatory Visit: Payer: Self-pay

## 2015-06-09 DIAGNOSIS — C50411 Malignant neoplasm of upper-outer quadrant of right female breast: Secondary | ICD-10-CM

## 2015-06-09 MED ORDER — ALPRAZOLAM 0.5 MG PO TABS: 0.2500 mg | ORAL_TABLET | Freq: Two times a day (BID) | ORAL | Status: DC | PRN

## 2015-06-09 NOTE — Progress Notes (Signed)
Let pt know prescription for xanax had been called in.  Pt voiced understanding.

## 2015-06-23 ENCOUNTER — Ambulatory Visit
Admission: RE | Admit: 2015-06-23 | Discharge: 2015-06-23 | Disposition: A | Discharge status: Home / Self Care | Payer: Medicare Other | Source: Ambulatory Visit | Attending: Radiation Oncology | Admitting: Radiation Oncology

## 2015-06-23 DIAGNOSIS — Z510 Encounter for antineoplastic radiation therapy: Secondary | ICD-10-CM | POA: Diagnosis not present

## 2015-06-23 DIAGNOSIS — C50411 Malignant neoplasm of upper-outer quadrant of right female breast: Secondary | ICD-10-CM

## 2015-06-23 NOTE — Progress Notes (Signed)
Name: Debra Kelly   MRN: [id number]  Date:  06/23/2015  DOB: 1941-04-18  Status:outpatient    DIAGNOSIS: Breast cancer.  CONSENT VERIFIED: yes   SET UP: Patient is setup supine   IMMOBILIZATION:  The following immobilization was used:Custom Moldable Pillow, breast board.   NARRATIVE: [name] was brought to the [facility].  Identity was confirmed.  All relevant records and images related to the planned course of therapy were reviewed.  Then, the patient was positioned in a stable reproducible clinical set-up for radiation therapy.  Wires were placed to delineate the clinical extent of breast tissue. A wire was placed on the scar as well.  CT images were obtained.  An isocenter was placed. Skin markings were placed.  The CT images were loaded into the planning software where the target and avoidance structures were contoured.  The radiation prescription was entered and confirmed. The patient was discharged in stable condition and tolerated simulation well.    TREATMENT PLANNING NOTE:  Treatment planning then occurred. I have requested : MLC's, isodose plan, basic dose calculation  I personally designed and supervised the construction of 3 medically necessary complex treatment devices for the protection of critical normal structures including the lungs and contralateral breast as well as the immobilization device which is necessary for set up certainty.   3D simulation occurred. I requested and analyzed a dose volume histogram of the heart, lungs and lumpectomy cavity.

## 2015-06-24 DIAGNOSIS — Z510 Encounter for antineoplastic radiation therapy: Secondary | ICD-10-CM | POA: Diagnosis not present

## 2015-06-26 DIAGNOSIS — Z510 Encounter for antineoplastic radiation therapy: Secondary | ICD-10-CM | POA: Diagnosis not present

## 2015-06-30 ENCOUNTER — Ambulatory Visit
Admission: RE | Admit: 2015-06-30 | Discharge: 2015-06-30 | Disposition: A | Discharge status: Home / Self Care | Payer: Medicare Other | Source: Ambulatory Visit | Attending: Radiation Oncology | Admitting: Radiation Oncology

## 2015-06-30 DIAGNOSIS — C50411 Malignant neoplasm of upper-outer quadrant of right female breast: Secondary | ICD-10-CM

## 2015-06-30 DIAGNOSIS — Z510 Encounter for antineoplastic radiation therapy: Secondary | ICD-10-CM | POA: Diagnosis not present

## 2015-06-30 MED ORDER — ALRA NON-METALLIC DEODORANT (RAD-ONC)
1.0000 "application " | Freq: Once | TOPICAL | Status: AC
  Administered 2015-06-30: 1 via TOPICAL

## 2015-06-30 MED ORDER — BIAFINE EX EMUL
CUTANEOUS | Status: DC | PRN
  Administered 2015-06-30: 17:00:00 via TOPICAL

## 2015-07-01 ENCOUNTER — Ambulatory Visit
Admission: RE | Admit: 2015-07-01 | Discharge: 2015-07-01 | Disposition: A | Discharge status: Home / Self Care | Payer: Medicare Other | Source: Ambulatory Visit | Attending: Radiation Oncology | Admitting: Radiation Oncology

## 2015-07-01 DIAGNOSIS — Z510 Encounter for antineoplastic radiation therapy: Secondary | ICD-10-CM | POA: Diagnosis not present

## 2015-07-01 NOTE — Progress Notes (Signed)
Oriented patient to staff and routine of the clinic. Provided patient with RADIATION THERAPY AND YOU handbook then, reviewed pertinent information. Educated patient reference potential side effects and management such as, fatigue, hair loss and skin changes. Provided patient with biafine and alra then, directed upon use. Encouraged patient to contact staff with future needs and provided patient with Val's business card. Answered all questions to the best of my ability. Patient verbalized understanding of all reviewed.

## 2015-07-02 ENCOUNTER — Ambulatory Visit
Admission: RE | Admit: 2015-07-02 | Discharge: 2015-07-02 | Disposition: A | Discharge status: Home / Self Care | Payer: Medicare Other | Source: Ambulatory Visit | Attending: Radiation Oncology | Admitting: Radiation Oncology

## 2015-07-02 ENCOUNTER — Ambulatory Visit: Payer: Medicare Other

## 2015-07-02 DIAGNOSIS — Z510 Encounter for antineoplastic radiation therapy: Secondary | ICD-10-CM | POA: Diagnosis not present

## 2015-07-03 ENCOUNTER — Ambulatory Visit: Payer: Medicare Other

## 2015-07-03 ENCOUNTER — Ambulatory Visit
Admission: RE | Admit: 2015-07-03 | Discharge: 2015-07-03 | Disposition: A | Discharge status: Home / Self Care | Payer: Medicare Other | Source: Ambulatory Visit | Attending: Radiation Oncology | Admitting: Radiation Oncology

## 2015-07-03 DIAGNOSIS — C50411 Malignant neoplasm of upper-outer quadrant of right female breast: Secondary | ICD-10-CM

## 2015-07-03 DIAGNOSIS — Z510 Encounter for antineoplastic radiation therapy: Secondary | ICD-10-CM | POA: Diagnosis not present

## 2015-07-03 MED ORDER — BIAFINE EX EMUL
Freq: Every day | CUTANEOUS | Status: DC
  Administered 2015-07-03: 09:00:00 via TOPICAL

## 2015-07-06 ENCOUNTER — Ambulatory Visit
Admission: RE | Admit: 2015-07-06 | Discharge: 2015-07-06 | Disposition: A | Discharge status: Home / Self Care | Payer: Medicare Other | Source: Ambulatory Visit | Attending: Radiation Oncology | Admitting: Radiation Oncology

## 2015-07-06 DIAGNOSIS — Z510 Encounter for antineoplastic radiation therapy: Secondary | ICD-10-CM | POA: Diagnosis not present

## 2015-07-07 ENCOUNTER — Ambulatory Visit
Admission: RE | Admit: 2015-07-07 | Discharge: 2015-07-07 | Disposition: A | Discharge status: Home / Self Care | Payer: Medicare Other | Source: Ambulatory Visit | Attending: Radiation Oncology | Admitting: Radiation Oncology

## 2015-07-07 DIAGNOSIS — Z510 Encounter for antineoplastic radiation therapy: Secondary | ICD-10-CM | POA: Diagnosis not present

## 2015-07-08 ENCOUNTER — Ambulatory Visit
Admission: RE | Admit: 2015-07-08 | Discharge: 2015-07-08 | Disposition: A | Discharge status: Home / Self Care | Payer: Medicare Other | Source: Ambulatory Visit | Attending: Radiation Oncology | Admitting: Radiation Oncology

## 2015-07-08 ENCOUNTER — Ambulatory Visit
Admission: RE | Admit: 2015-07-08 | Discharge: 2015-07-08 | Disposition: A | Discharge status: Home / Self Care | Payer: BLUE CROSS/BLUE SHIELD | Source: Ambulatory Visit | Attending: Radiation Oncology | Admitting: Radiation Oncology

## 2015-07-08 VITALS — BP 127/57 | HR 87 | Temp 98.3°F | Wt 206.5 lb

## 2015-07-08 DIAGNOSIS — Z510 Encounter for antineoplastic radiation therapy: Secondary | ICD-10-CM | POA: Diagnosis not present

## 2015-07-08 DIAGNOSIS — C50411 Malignant neoplasm of upper-outer quadrant of right female breast: Secondary | ICD-10-CM

## 2015-07-08 NOTE — Progress Notes (Signed)
Weekly assessment of radiation to right breast.Completed 5 of 25 treatments.Denies pain.Skin is fine.Has 4 discolored areas around areola when injected by dye.

## 2015-07-08 NOTE — Progress Notes (Signed)
  Radiation Oncology         [phone]) [phone] ________________________________  Name: Debra Kelly MRN: [id number]  Date: 07/08/2015  DOB: 1941/05/25  Weekly Radiation Therapy Management    ICD-9-CM ICD-10-CM   1. Breast cancer of upper-outer quadrant of right female breast 174.4 C50.411     Current Dose: 10 Gy     Planned Dose:  50 Gy  Narrative: The patient presents for routine under treatment assessment.  The patient confirms that this week she has develops symptoms of fatigue, that are triggered around 3:00pm daily. However, she reports no other symptoms from treatment at this time. Her fit-bit watch helps motivate her to keep active. Set-up films were reviewed and the chart was checked.  Physical Findings:  weight is 206 lb 8 oz (93.668 kg). Her temperature is 98.3 F (36.8 C). Her blood pressure is 127/57 and her pulse is 87. Marland Kitchen Her weight is essentially stable with no significant changes noted at this time. Lungs are clear, heart has regular rate and rhythm, with no palpable cervical, supraclavicular, or axillary adenopathy to be noted. Mild erythema noted along the right breast area no signs of infection   Impression: Debra Kelly is a 74 year old female presenting to clinic in regards to her cancer of the female breast. The patient is tolerating radiation well.   Plan: The patient was advised to continue treatment as planned. If she develops any further questions or concerns, she was encouraged to contact Dr. Sondra Come, MD, PhD.   This document serves as a record of services personally performed by Gery Pray, MD. It was created on his behalf by Lenn Cal, a trained medical scribe. The creation of this record is based on the scribe's personal observations and the provider's statements to them. This document has been checked and approved by the attending provider.   ________________________________   Blair Promise, PhD, MD

## 2015-07-09 ENCOUNTER — Ambulatory Visit
Admission: RE | Admit: 2015-07-09 | Discharge: 2015-07-09 | Disposition: A | Discharge status: Home / Self Care | Payer: Medicare Other | Source: Ambulatory Visit | Attending: Radiation Oncology | Admitting: Radiation Oncology

## 2015-07-09 DIAGNOSIS — Z510 Encounter for antineoplastic radiation therapy: Secondary | ICD-10-CM | POA: Diagnosis not present

## 2015-07-10 ENCOUNTER — Ambulatory Visit
Admission: RE | Admit: 2015-07-10 | Discharge: 2015-07-10 | Disposition: A | Discharge status: Home / Self Care | Payer: Medicare Other | Source: Ambulatory Visit | Attending: Radiation Oncology | Admitting: Radiation Oncology

## 2015-07-10 DIAGNOSIS — Z510 Encounter for antineoplastic radiation therapy: Secondary | ICD-10-CM | POA: Diagnosis not present

## 2015-07-13 ENCOUNTER — Ambulatory Visit
Admission: RE | Admit: 2015-07-13 | Discharge: 2015-07-13 | Disposition: A | Discharge status: Home / Self Care | Payer: Medicare Other | Source: Ambulatory Visit | Attending: Radiation Oncology | Admitting: Radiation Oncology

## 2015-07-13 DIAGNOSIS — Z510 Encounter for antineoplastic radiation therapy: Secondary | ICD-10-CM | POA: Diagnosis not present

## 2015-07-14 ENCOUNTER — Ambulatory Visit
Admission: RE | Admit: 2015-07-14 | Discharge: 2015-07-14 | Disposition: A | Discharge status: Home / Self Care | Payer: Medicare Other | Source: Ambulatory Visit | Attending: Radiation Oncology | Admitting: Radiation Oncology

## 2015-07-14 VITALS — BP 140/73 | HR 99 | Temp 98.5°F | Wt 203.7 lb

## 2015-07-14 DIAGNOSIS — C50411 Malignant neoplasm of upper-outer quadrant of right female breast: Secondary | ICD-10-CM

## 2015-07-14 DIAGNOSIS — Z510 Encounter for antineoplastic radiation therapy: Secondary | ICD-10-CM | POA: Diagnosis not present

## 2015-07-14 NOTE — Progress Notes (Signed)
Weekly Management Note Current Dose:   20 Gy  Projected Dose:  50 Gy   Narrative:  The patient presents for routine under treatment assessment.  CBCT/MVCT images/Port film x-rays were reviewed.  The chart was checked. She complained of fatigue and dehydration. Otherwise feels well.  Physical Findings: Weight: 203 lb 11.2 oz (92.398 kg). No skin changes noted.  Impression:  The patient is tolerating radiation.  Plan:  Continue treatment as planned. Continue radiaplex. OK to take Vitamin B12 supplements.   This document serves as a record of services personally performed by Thea Silversmith, MD. It was created on her behalf by Darcus Austin, a trained medical scribe. The creation of this record is based on the scribe's personal observations and the provider's statements to them. This document has been checked and approved by the attending provider.

## 2015-07-14 NOTE — Progress Notes (Signed)
Weekly assessment of radiation to right breast.Completed 10 of 25 treatments.Breast pink in some areas.Denies pain.inquired if she can take b-complex for fatigue.

## 2015-07-15 ENCOUNTER — Ambulatory Visit
Admission: RE | Admit: 2015-07-15 | Discharge: 2015-07-15 | Disposition: A | Discharge status: Home / Self Care | Payer: Medicare Other | Source: Ambulatory Visit | Attending: Radiation Oncology | Admitting: Radiation Oncology

## 2015-07-15 DIAGNOSIS — Z510 Encounter for antineoplastic radiation therapy: Secondary | ICD-10-CM | POA: Diagnosis not present

## 2015-07-16 ENCOUNTER — Ambulatory Visit
Admission: RE | Admit: 2015-07-16 | Discharge: 2015-07-16 | Disposition: A | Discharge status: Home / Self Care | Payer: Medicare Other | Source: Ambulatory Visit | Attending: Radiation Oncology | Admitting: Radiation Oncology

## 2015-07-16 DIAGNOSIS — Z510 Encounter for antineoplastic radiation therapy: Secondary | ICD-10-CM | POA: Diagnosis not present

## 2015-07-17 ENCOUNTER — Ambulatory Visit
Admission: RE | Admit: 2015-07-17 | Discharge: 2015-07-17 | Disposition: A | Discharge status: Home / Self Care | Payer: Medicare Other | Source: Ambulatory Visit | Attending: Radiation Oncology | Admitting: Radiation Oncology

## 2015-07-17 DIAGNOSIS — Z510 Encounter for antineoplastic radiation therapy: Secondary | ICD-10-CM | POA: Diagnosis not present

## 2015-07-20 ENCOUNTER — Ambulatory Visit
Admission: RE | Admit: 2015-07-20 | Discharge: 2015-07-20 | Disposition: A | Discharge status: Home / Self Care | Payer: Medicare Other | Source: Ambulatory Visit | Attending: Radiation Oncology | Admitting: Radiation Oncology

## 2015-07-20 DIAGNOSIS — Z510 Encounter for antineoplastic radiation therapy: Secondary | ICD-10-CM | POA: Diagnosis not present

## 2015-07-21 ENCOUNTER — Ambulatory Visit
Admission: RE | Admit: 2015-07-21 | Discharge: 2015-07-21 | Disposition: A | Discharge status: Home / Self Care | Payer: BLUE CROSS/BLUE SHIELD | Source: Ambulatory Visit | Attending: Radiation Oncology | Admitting: Radiation Oncology

## 2015-07-21 ENCOUNTER — Encounter: Payer: Self-pay | Admitting: Radiation Oncology

## 2015-07-21 ENCOUNTER — Ambulatory Visit
Admission: RE | Admit: 2015-07-21 | Discharge: 2015-07-21 | Disposition: A | Discharge status: Home / Self Care | Payer: Medicare Other | Source: Ambulatory Visit | Attending: Radiation Oncology | Admitting: Radiation Oncology

## 2015-07-21 VITALS — BP 144/78 | HR 85 | Temp 98.4°F | Resp 20 | Wt 204.3 lb

## 2015-07-21 DIAGNOSIS — Z510 Encounter for antineoplastic radiation therapy: Secondary | ICD-10-CM | POA: Diagnosis not present

## 2015-07-21 DIAGNOSIS — C50411 Malignant neoplasm of upper-outer quadrant of right female breast: Secondary | ICD-10-CM

## 2015-07-21 NOTE — Progress Notes (Addendum)
Weekly rad tx right breast  Has had 14 txs so far, here before tx, dermatitis on mid chest,erythmea, under  inframmary fold skin thinning, no c/o itching, using biafine bid BP 144/78 mmHg  Pulse 85  Temp(Src) 98.4 F (36.9 C) (Oral)  Resp 20  Wt 204 lb 4.8 oz (92.67 kg)  Wt Readings from Last 3 Encounters:  07/21/15 204 lb 4.8 oz (92.67 kg)  07/14/15 203 lb 11.2 oz (92.398 kg)  07/08/15 206 lb 8 oz (93.668 kg)

## 2015-07-21 NOTE — Progress Notes (Signed)
Weekly Management Note Current Dose:  30 Gy  Projected Dose: 50 Gy   Narrative:  The patient presents for routine under treatment assessment.  CBCT/MVCT images/Port film x-rays were reviewed.  The chart was checked. Doing well. Itching medially.   Physical Findings: Weight: 204 lb 4.8 oz (92.67 kg). Dermatitis medially. Inframammary fold is clear.   Impression:  The patient is tolerating radiation.  Plan:  Continue treatment as planned. Hydorcortisone to medial chest. Continue biafene to rest of breast.

## 2015-07-22 ENCOUNTER — Ambulatory Visit
Admission: RE | Admit: 2015-07-22 | Discharge: 2015-07-22 | Disposition: A | Discharge status: Home / Self Care | Payer: Medicare Other | Source: Ambulatory Visit | Attending: Radiation Oncology | Admitting: Radiation Oncology

## 2015-07-22 DIAGNOSIS — Z510 Encounter for antineoplastic radiation therapy: Secondary | ICD-10-CM | POA: Diagnosis not present

## 2015-07-23 ENCOUNTER — Ambulatory Visit
Admission: RE | Admit: 2015-07-23 | Discharge: 2015-07-23 | Disposition: A | Discharge status: Home / Self Care | Payer: Medicare Other | Source: Ambulatory Visit | Attending: Radiation Oncology | Admitting: Radiation Oncology

## 2015-07-23 DIAGNOSIS — Z510 Encounter for antineoplastic radiation therapy: Secondary | ICD-10-CM | POA: Diagnosis not present

## 2015-07-24 ENCOUNTER — Ambulatory Visit
Admission: RE | Admit: 2015-07-24 | Discharge: 2015-07-24 | Disposition: A | Discharge status: Home / Self Care | Payer: Medicare Other | Source: Ambulatory Visit | Attending: Radiation Oncology | Admitting: Radiation Oncology

## 2015-07-24 DIAGNOSIS — Z510 Encounter for antineoplastic radiation therapy: Secondary | ICD-10-CM | POA: Diagnosis not present

## 2015-07-27 ENCOUNTER — Ambulatory Visit
Admission: RE | Admit: 2015-07-27 | Discharge: 2015-07-27 | Disposition: A | Discharge status: Home / Self Care | Payer: Medicare Other | Source: Ambulatory Visit | Attending: Radiation Oncology | Admitting: Radiation Oncology

## 2015-07-27 DIAGNOSIS — Z510 Encounter for antineoplastic radiation therapy: Secondary | ICD-10-CM | POA: Diagnosis not present

## 2015-07-28 ENCOUNTER — Ambulatory Visit
Admission: RE | Admit: 2015-07-28 | Discharge: 2015-07-28 | Disposition: A | Discharge status: Home / Self Care | Payer: Medicare Other | Source: Ambulatory Visit | Attending: Radiation Oncology | Admitting: Radiation Oncology

## 2015-07-28 ENCOUNTER — Ambulatory Visit
Admission: RE | Admit: 2015-07-28 | Discharge: 2015-07-28 | Disposition: A | Discharge status: Home / Self Care | Payer: BLUE CROSS/BLUE SHIELD | Source: Ambulatory Visit | Attending: Radiation Oncology | Admitting: Radiation Oncology

## 2015-07-28 VITALS — BP 131/55 | HR 76 | Temp 98.2°F | Wt 207.9 lb

## 2015-07-28 DIAGNOSIS — Z510 Encounter for antineoplastic radiation therapy: Secondary | ICD-10-CM | POA: Diagnosis not present

## 2015-07-28 DIAGNOSIS — C50411 Malignant neoplasm of upper-outer quadrant of right female breast: Secondary | ICD-10-CM

## 2015-07-28 NOTE — Progress Notes (Signed)
Weekly Management Note Current Dose: 40  Gy  Projected Dose: 50 Gy   Narrative:  The patient presents for routine under treatment assessment.  CBCT/MVCT images/Port film x-rays were reviewed.  The chart was checked. Itching in medial chest. Some fatigue. Using biafene.   Physical Findings: Weight: 207 lb 14.4 oz (94.303 kg). Dermatitis medially.   Impression:  The patient is tolerating radiation.  Plan:  Continue treatment as planned. Continue biafene and hydrocortisone.

## 2015-07-28 NOTE — Progress Notes (Signed)
Weekly assessment of radiation to right breast.Completed 20 of 25 treatments.Has itching rash mid chest.Using neosporin on mammary fold.Moderate fatigue.Continue biafine 2 to 3 times daily.

## 2015-07-29 ENCOUNTER — Ambulatory Visit
Admission: RE | Admit: 2015-07-29 | Discharge: 2015-07-29 | Disposition: A | Discharge status: Home / Self Care | Payer: Medicare Other | Source: Ambulatory Visit | Attending: Radiation Oncology | Admitting: Radiation Oncology

## 2015-07-29 DIAGNOSIS — Z510 Encounter for antineoplastic radiation therapy: Secondary | ICD-10-CM | POA: Diagnosis not present

## 2015-07-29 NOTE — Progress Notes (Signed)
Patient presented to the clinic following radiation treatment "for that stuff to dry up the place under her breast." Confirmed the patient is using neosporin on right mammary fold at the area of desquamation and biafine on the remaining treatment field. Offered to paint area of desquamation with gentian violet. She stated, "I thought it was just a cream I could grab and go." Explained what gentian violet is and the process. Patient states, "I can't do that today because I have to get to work." Explained the nurses are available should she change her mind. She verbalized understanding and will contact our office with future needs.

## 2015-07-30 ENCOUNTER — Ambulatory Visit
Admission: RE | Admit: 2015-07-30 | Discharge: 2015-07-30 | Disposition: A | Discharge status: Home / Self Care | Payer: Medicare Other | Source: Ambulatory Visit | Attending: Radiation Oncology | Admitting: Radiation Oncology

## 2015-07-30 DIAGNOSIS — Z510 Encounter for antineoplastic radiation therapy: Secondary | ICD-10-CM | POA: Diagnosis not present

## 2015-07-31 ENCOUNTER — Ambulatory Visit
Admission: RE | Admit: 2015-07-31 | Discharge: 2015-07-31 | Disposition: A | Discharge status: Home / Self Care | Payer: Medicare Other | Source: Ambulatory Visit | Attending: Radiation Oncology | Admitting: Radiation Oncology

## 2015-07-31 DIAGNOSIS — Z510 Encounter for antineoplastic radiation therapy: Secondary | ICD-10-CM | POA: Diagnosis not present

## 2015-08-03 ENCOUNTER — Ambulatory Visit
Admission: RE | Admit: 2015-08-03 | Discharge: 2015-08-03 | Disposition: A | Discharge status: Home / Self Care | Payer: Medicare Other | Source: Ambulatory Visit | Attending: Radiation Oncology | Admitting: Radiation Oncology

## 2015-08-03 DIAGNOSIS — Z510 Encounter for antineoplastic radiation therapy: Secondary | ICD-10-CM | POA: Diagnosis not present

## 2015-08-04 ENCOUNTER — Ambulatory Visit
Admission: RE | Admit: 2015-08-04 | Discharge: 2015-08-04 | Disposition: A | Discharge status: Home / Self Care | Payer: Medicare Other | Source: Ambulatory Visit | Attending: Radiation Oncology | Admitting: Radiation Oncology

## 2015-08-04 VITALS — BP 108/47 | HR 98 | Temp 98.3°F | Wt 199.9 lb

## 2015-08-04 DIAGNOSIS — C50411 Malignant neoplasm of upper-outer quadrant of right female breast: Secondary | ICD-10-CM | POA: Diagnosis present

## 2015-08-04 DIAGNOSIS — Z510 Encounter for antineoplastic radiation therapy: Secondary | ICD-10-CM | POA: Diagnosis not present

## 2015-08-04 MED ORDER — BIAFINE EX EMUL
CUTANEOUS | Status: DC | PRN
  Administered 2015-08-04: 14:00:00 via TOPICAL

## 2015-08-04 NOTE — Addendum Note (Signed)
Encounter addended by: Norm Salt, RN on: 08/04/2015  1:56 PM<BR>     Documentation filed: [employer], Orders

## 2015-08-04 NOTE — Addendum Note (Signed)
Encounter addended by: Norm Salt, RN on: 08/04/2015  2:05 PM<BR>     Documentation filed: Inpatient MAR

## 2015-08-04 NOTE — Progress Notes (Signed)
  Radiation Oncology         [phone]) [phone] ________________________________  Name: Debra Kelly MRN: [id number]  Date: 08/04/2015  DOB: Feb 28, 1941  End of Treatment Note  Diagnosis:   Breast cancer of upper-outer quadrant of right female breast   Staging form: Breast, AJCC 7th Edition     Clinical stage from 04/22/2015: Stage IA (T1b, N0, M0) - Unsigned       Staging comments: Staged at breast conference on 5.11.16      Pathologic stage from 05/13/2015: Stage IA (T1c, N0, cM0) - Signed by Enid Cutter, MD on 05/18/2015       Staging comments: Staged on final lumpectomy specimen by Dr. Lyndon Code    Indication for treatment:  Curative      Radiation treatment dates:   07/01/15-08/04/15  Site/dose:   Right breast/ 50 Gy at 2 Gy per fraction x 25 fractions.   Beams/energy:  Opposed tangents with reduced fields / 6 and 10  MV photons  Narrative: The patient tolerated radiation treatment relatively well.   She had moist desquamation in the inframammary folds and dry desquamation over the breast.   Plan: The patient has completed radiation treatment. The patient will return to radiation oncology clinic for routine followup in one month. I advised them to call or return sooner if they have any questions or concerns related to their recovery or treatment.  ------------------------------------------------  Thea Silversmith, MD

## 2015-08-06 ENCOUNTER — Other Ambulatory Visit: Payer: Self-pay | Admitting: Adult Health

## 2015-08-06 DIAGNOSIS — C50411 Malignant neoplasm of upper-outer quadrant of right female breast: Secondary | ICD-10-CM

## 2015-08-10 ENCOUNTER — Telehealth: Payer: Self-pay | Admitting: *Deleted

## 2015-08-10 NOTE — Telephone Encounter (Signed)
Patient called to report an itchy rash. States that it is on the back of her neck presently but has appeared on her arms and legs. She is taking Benadryl and using hydrocortisone cream. States that she reported it to Dr. Pablo Ledger and she did not feel it was related to radiation. Advised patient to continue as she is doing but if worsens she may need to see her PCP or a dermatologist. She verbalized understanding.

## 2015-08-18 ENCOUNTER — Telehealth: Payer: Self-pay | Admitting: Hematology and Oncology

## 2015-08-18 ENCOUNTER — Ambulatory Visit (HOSPITAL_BASED_OUTPATIENT_CLINIC_OR_DEPARTMENT_OTHER): Payer: Medicare Other | Admitting: Hematology and Oncology

## 2015-08-18 ENCOUNTER — Encounter: Payer: Self-pay | Admitting: Hematology and Oncology

## 2015-08-18 VITALS — BP 130/66 | HR 85 | Temp 98.2°F | Resp 18 | Ht 64.0 in | Wt 201.0 lb

## 2015-08-18 DIAGNOSIS — F411 Generalized anxiety disorder: Secondary | ICD-10-CM

## 2015-08-18 DIAGNOSIS — L299 Pruritus, unspecified: Secondary | ICD-10-CM

## 2015-08-18 DIAGNOSIS — C50411 Malignant neoplasm of upper-outer quadrant of right female breast: Secondary | ICD-10-CM | POA: Diagnosis present

## 2015-08-18 DIAGNOSIS — Z170 Estrogen receptor positive status [ER+]: Secondary | ICD-10-CM

## 2015-08-18 MED ORDER — ALPRAZOLAM 0.5 MG PO TABS: 0.2500 mg | ORAL_TABLET | Freq: Two times a day (BID) | ORAL | Status: DC | PRN

## 2015-08-18 MED ORDER — METHYLPREDNISOLONE 4 MG PO TBPK: ORAL_TABLET | ORAL | Status: DC

## 2015-08-18 NOTE — Telephone Encounter (Signed)
Appointments made and avs printed for patient °

## 2015-08-18 NOTE — Progress Notes (Signed)
Patient Care Team: Olin Hauser, MD as PCP - General (Family Medicine) Alphonsa Overall, MD as Consulting Physician (General Surgery) Nicholas Lose, MD as Consulting Physician (Hematology and Oncology) Thea Silversmith, MD as Consulting Physician (Radiation Oncology) Mauro Kaufmann, RN as Registered Nurse Rockwell Germany, RN as Registered Nurse Holley Bouche, NP as Nurse Practitioner (Nurse Practitioner)  DIAGNOSIS: Breast cancer of upper-outer quadrant of right female breast   Staging form: Breast, AJCC 7th Edition     Clinical stage from 04/22/2015: Stage IA (T1b, N0, M0) - Unsigned       Staging comments: Staged at breast conference on 5.11.16      Pathologic stage from 05/13/2015: Stage IA (T1c, N0, cM0) - Signed by Enid Cutter, MD on 05/18/2015       Staging comments: Staged on final lumpectomy specimen by Dr. Lyndon Code    SUMMARY OF ONCOLOGIC HISTORY:   Breast cancer of upper-outer quadrant of right female breast   04/03/2015 Mammogram Right breast architectural distortion 11:00 position 7 mm by ultrasound, biopsied by stereotactic approach.   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 She completed radiation therapy on 08/04/2015. During radiation patient started developing itching sensation  in her arms and legs as well as breast. There was no clear etiology behind this. It was not felt to be radiation related. She has been taking Benadryl pills with good relief but the symptoms seem to come back. She is also applying cortisone cream. She is initially thought that this could be related to poison ivy. But she has not had any further exposure in her itching and rash continued to be a persistent problem.  REVIEW OF SYSTEMS:   Constitutional: Denies fevers, chills or abnormal weight loss Eyes: Denies blurriness of vision Ears, nose, mouth, throat, and face: Denies mucositis or sore throat Respiratory: Denies cough, dyspnea or wheezes Cardiovascular: Denies palpitation, chest discomfort or lower extremity swelling Gastrointestinal:  Denies nausea, heartburn or change in bowel habits Skin: severe itching throughout her body Lymphatics: Denies new lymphadenopathy or easy bruising Neurological:Denies numbness, tingling or new weaknesses Behavioral/Psych: Mood is stable, no new changes  All other systems were reviewed with the patient and are negative.  I have reviewed the past medical history, past surgical history, social history and family history with the patient and they are unchanged from previous note.  ALLERGIES:  is allergic to shellfish allergy; celebrex; claritin; radiaplexrx; adhesive; barbiturates; and codeine.  MEDICATIONS:  Current Outpatient Prescriptions  Medication Sig Dispense Refill  . ALPRAZolam (XANAX) 0.5 MG tablet Take 0.5 tablets (0.25 mg total) by mouth 2 (two) times daily as needed for anxiety. 60 tablet 0  . anastrozole (ARIMIDEX) 1 MG tablet Take 1 tablet (1 mg total) by mouth daily. 90 tablet 3  . aspirin 81 MG tablet Take 81 mg by mouth daily.    Marland Kitchen  b complex vitamins tablet Take 1 tablet by mouth daily.    . Calcium Carbonate (CALCIUM 600 PO) Take by mouth daily.    . Cholecalciferol (VITAMIN D PO) Take 600 mg by mouth daily.    Marland Kitchen emollient (BIAFINE)  cream Apply topically daily.    Marland Kitchen levothyroxine (SYNTHROID, LEVOTHROID) 175 MCG tablet Take 175 mcg by mouth daily before breakfast.    . lisinopril (PRINIVIL,ZESTRIL) 10 MG tablet Take 10 mg by mouth every morning.    . Magnesium 500 MG CAPS Take by mouth daily.    . non-metallic deodorant Jethro Poling) MISC Apply 1 application topically daily as needed.    Marland Kitchen oxybutynin (DITROPAN) 5 MG tablet Take 5 mg by mouth daily.     . traMADol (ULTRAM) 50 MG tablet Take 1-2 tablets (50-100 mg total) by mouth every 6 (six) hours as needed. 30 tablet 1  . zolpidem (AMBIEN) 5 MG tablet      No current facility-administered medications for this visit.    PHYSICAL EXAMINATION: ECOG PERFORMANCE STATUS: 1 - Symptomatic but completely ambulatory  Filed Vitals:   08/18/15 0951  BP: 130/66  Pulse: 85  Temp: 98.2 F (36.8 C)  Resp: 18   Filed Weights   08/18/15 0951  Weight: 201 lb (91.173 kg)    GENERAL:alert, no distress and comfortable SKIN: skin color, texture, turgor are normal, no rashes or significant lesions EYES: normal, Conjunctiva are pink and non-injected, sclera clear OROPHARYNX:no exudate, no erythema and lips, buccal mucosa, and tongue normal  NECK: supple, thyroid normal size, non-tender, without nodularity LYMPH:  no palpable lymphadenopathy in the cervical, axillary or inguinal LUNGS: clear to auscultation and percussion with normal breathing effort HEART: regular rate & rhythm and no murmurs and no lower extremity edema ABDOMEN:abdomen soft, non-tender and normal bowel sounds Musculoskeletal:no cyanosis of digits and no clubbing  NEURO: alert & oriented x 3 with fluent speech, no focal motor/sensory deficits  LABORATORY DATA:  I have reviewed the data as listed   Chemistry      Component Value Date/Time   NA 142 04/22/2015 0804   NA 142 10/16/2013 0537   K 4.2 04/22/2015 0804   K 3.7 10/16/2013 0537   CL 106 10/16/2013 0537   CO2 26 04/22/2015 0804   CO2 30 10/16/2013 0537    BUN 15.1 04/22/2015 0804   BUN 6 10/16/2013 0537   CREATININE 0.7 04/22/2015 0804   CREATININE 0.70 10/16/2013 0537      Component Value Date/Time   CALCIUM 9.0 04/22/2015 0804   CALCIUM 9.0 10/16/2013 0537   ALKPHOS 67 04/22/2015 0804   AST 13 04/22/2015 0804   ALT 13 04/22/2015 0804   BILITOT 0.50 04/22/2015 0804       Lab Results  Component Value Date   WBC 5.2 04/22/2015   HGB 13.2 04/22/2015   HCT 40.4 04/22/2015   MCV 92.9 04/22/2015   PLT 259 04/22/2015   NEUTROABS 2.9 04/22/2015   ASSESSMENT & PLAN:  Breast cancer of upper-outer quadrant of right female breast Right breast lumpectomy 05/08/15: 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 These include but not limited to insomnia, hot flashes, mood changes, vaginal dryness,  bone density loss, and weight gain. Although rare, serious side effects including endometrial cancer, risk of blood clots were also discussed. We strongly believe that the benefits far outweigh the risks. Patient understands these risks and consented to starting treatment. Planned treatment duration is 5 years.  Profound itching: This started 3 weeks ago. She has applied cortisone cream and taking Benadryl pills but the itching keeps coming back. I recommended that she stop taking all nonessential vitamins and minerals. I do not believe it is related to anastrozole because anastrozole was only started 2 days ago. I prescribed her Medrol Dosepak. If this does not resolve for itching, I recommended that she see a dermatologist.  Anxiety: We'll renew Xanax today. Patient has retired and she feels remarkably well  off and relaxed.  Return to clinic in 3 months for toxicity check and follow-up    No orders of the defined types were placed in this encounter.   The patient has a good understanding of the overall plan. she agrees with it. she will call with any problems that may develop before the next visit here.   Rulon Eisenmenger, MD

## 2015-08-18 NOTE — Addendum Note (Signed)
Addended by: Lujean Amel on: 08/18/2015 02:57 PM   Modules accepted: Orders, Medications

## 2015-08-18 NOTE — Assessment & Plan Note (Signed)
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 These include but not limited to insomnia, hot flashes, mood changes, vaginal dryness, bone density loss, and weight gain. Although rare, serious side effects including endometrial cancer, risk of blood clots were also discussed. We strongly believe that the benefits far outweigh the risks. Patient understands these risks and consented to starting treatment. Planned treatment duration is 5 years.  Return to clinic in 3 months for toxicity check and follow-up

## 2015-08-20 ENCOUNTER — Encounter: Payer: Self-pay | Admitting: Radiation Oncology

## 2015-09-10 ENCOUNTER — Ambulatory Visit
Admission: RE | Admit: 2015-09-10 | Discharge: 2015-09-10 | Disposition: A | Discharge status: Home / Self Care | Payer: Medicare Other | Source: Ambulatory Visit | Attending: Radiation Oncology | Admitting: Radiation Oncology

## 2015-09-10 VITALS — BP 120/47 | HR 95 | Temp 98.5°F | Wt 190.8 lb

## 2015-09-10 DIAGNOSIS — C50411 Malignant neoplasm of upper-outer quadrant of right female breast: Secondary | ICD-10-CM

## 2015-09-10 NOTE — Progress Notes (Signed)
   Department of Radiation Oncology  Phone:  [phone] Fax:        [phone]   Name: Debra Kelly MRN: [id number]  DOB: 11-11-1941  Date: 09/10/2015  Follow Up Visit Note  Diagnosis: Breast cancer of upper-outer quadrant of right female breast   Staging form: Breast, AJCC 7th Edition     Clinical stage from 04/22/2015: Stage IA (T1b, N0, M0) - Unsigned       Staging comments: Staged at breast conference on 5.11.16      Pathologic stage from 05/13/2015: Stage IA (T1c, N0, cM0) - Signed by Enid Cutter, MD on 05/18/2015       Staging comments: Staged on final lumpectomy specimen by Dr. Lyndon Code   Summary and Interval since last radiation: Right breast/ 50 Gy at 2 Gy per fraction x 25 fractions.   Interval History: Debra Kelly presents today for routine followup.  She is healed up well. She developed a rash over her body worse on her feet and hands which is better after a steroid dose pak.  She is tolerating her anastrazole well. She has good energy and appetite.   Physical Exam:  Filed Vitals:   09/10/15 0806  BP: 120/47  Pulse: 95  Temp: 98.5 F (36.9 C)  Weight: 190 lb 12.8 oz (86.546 kg)  SpO2: 97%   Pleasant woman in no distress. She has hyperpigmentation in the inframammary fold. Excellent cosmetic response  IMPRESSION: Debra Kelly is a 74 y.o. female s/p breast conservation currently NED.   PLAN:  She is doing well. We discussed the need for follow up every 4-6 months which she has scheduled.  We discussed the need for yearly mammograms which she can schedule with her OBGYN or with medical oncology. We discussed the need for sun protection in the treated area.  She can always call me with questions.  I will follow up with her on an as needed basis.   Follow up with dermatology regarding rash. Use zyrtec and benadryl prn. Appointment with survivorship made.      Thea Silversmith, MD

## 2015-09-24 ENCOUNTER — Encounter: Payer: Self-pay | Admitting: Nurse Practitioner

## 2015-09-24 ENCOUNTER — Ambulatory Visit (HOSPITAL_BASED_OUTPATIENT_CLINIC_OR_DEPARTMENT_OTHER): Payer: Medicare Other | Admitting: Nurse Practitioner

## 2015-09-24 VITALS — BP 157/73 | HR 94 | Temp 98.1°F | Resp 18 | Ht 64.0 in | Wt 195.9 lb

## 2015-09-24 DIAGNOSIS — R05 Cough: Secondary | ICD-10-CM

## 2015-09-24 DIAGNOSIS — M8580 Other specified disorders of bone density and structure, unspecified site: Secondary | ICD-10-CM | POA: Diagnosis not present

## 2015-09-24 DIAGNOSIS — Z79811 Long term (current) use of aromatase inhibitors: Secondary | ICD-10-CM

## 2015-09-24 DIAGNOSIS — C50411 Malignant neoplasm of upper-outer quadrant of right female breast: Secondary | ICD-10-CM

## 2015-09-24 DIAGNOSIS — Z170 Estrogen receptor positive status [ER+]: Secondary | ICD-10-CM

## 2015-09-24 NOTE — Progress Notes (Signed)
CLINIC:  Cancer Survivorship   REASON FOR VISIT:  Routine follow-up post-treatment for a recent history of breast cancer.  BRIEF ONCOLOGIC HISTORY:    Breast cancer of upper-outer quadrant of right female breast ([place])   04/03/2015 Mammogram LEFT breast: circumscribed oval mass in the outer quadrant measuring ~12 mm. This is posterior to a second oval mass that is unchanged from prior exams. 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 No chemotherapy Lindi Adie).   05/08/2015 Pathologic Stage Stage IA: pT1c pN0   07/01/2015 - 08/04/2015 Radiation Therapy Adjuvant RT Pablo Ledger): Right breast 50 Gy over 25 fractions   08/17/2015 -  Anti-estrogen oral therapy Anastrozole 1 mg daily (Gudena). Planned duration of therapy 5 years.    INTERVAL HISTORY:  Debra Kelly presents to the [facility] today for our initial meeting to review her survivorship care plan detailing her treatment course for breast cancer, as well as monitoring long-term side effects of that treatment, education regarding health maintenance, screening, and overall wellness and health promotion.     Overall, Debra Kelly reports feeling quite well since completing her radiation therapy approximately six weeks ago.  She reports that the redness over her right breast has resolved. She continues with fatigue, but is  able to perform her daily activities. She has retired from her job as of September 1 and is in the process of moving to a new home in [place]. She denies any change along either breast, specifically any mass or nodularity.  She will occasionally have some shooting, stabbing pain along her right breast which is self-limiting. She denies any headache, shortness of breath, bone pain, or change in weight.  She is continuing to recover from bronchitis which she had an early October and continues with cough. This is nonproductive and very annoying per her report.  She denies any fever or chills.She has a complete physical examination upcoming in November with her PCP.  Debra Kelly also reports some rash along her left arm recently that was treated with steroids per Dr. Lindi Adie. This did help, however, the rash still comes and goes and she has an upcoming appointment with a dermatologist.  She is tolerating her anastrozole hot flashes which she states are bearable. She does have anxiety which is well relieved by her PRN alprazolam. She denies any vaginal dryness or joint pain.  REVIEW OF SYSTEMS:  General: Occasional hot flash as above. Denies night sweats. Cardiac: Denies palpitations, chest pain, and lower extremity edema.  Respiratory: Denies  dyspnea on exertion.  GI: Denies abdominal pain, constipation, diarrhea, nausea, or vomiting.  GU: Denies dysuria, hematuria, vaginal bleeding, or vaginal discharge. Musculoskeletal: Denies joint pain.  Neuro: Denies recent falls or peripheral neuropathy. Skin: Denies rash, pruritis, or open wounds.  Breast: Denies any new nodularity, masses, tenderness, nipple changes, or nipple discharge in either breast.  Psych: Occasional anxiety as above. Denies depression, insomnia, or  memory loss.   A 14-point review of systems was completed and was negative, except as noted above.   ONCOLOGY TREATMENT TEAM:  1. Surgeon:  Dr. Lucia Gaskins at [facility]  2. Medical  Oncologist: Dr. Lindi Adie 3. Radiation Oncologist: Dr. Pablo Ledger    PAST MEDICAL/SURGICAL HISTORY:  Past Medical History  Diagnosis Date  . Hypertension   . Hypothyroidism 09-13-12    tx. supplement  . Arthritis 09-13-12    arthritis- knees, right shoulder, finger  . Breast cancer of upper-outer quadrant of right female breast ([place]) 04/20/2015  . Breast cancer ([place]) 04/16/15    right breastinvasive and in situ mammary   . Allergy   . Radiation 07/01/15-08/04/15    right breast   Past Surgical History  Procedure Laterality Date  . Tubal ligation  09-13-12    '76  . Appendectomy  09-13-12    '76  . Abdominal hysterectomy  09-13-12    '88  . Breast surgery  09-13-12    '86-rt. breast lumpectomy-benign  . Knee arthroscopy  09-13-12    '04-lt. knee/ '10,'11-right knee scopes  . Cystocele repair  09/18/2012    Procedure: ANTERIOR REPAIR (CYSTOCELE);  Surgeon: Reece Packer, MD;  Location: [facility];  Service: Urology;  Laterality: N/A;  Cystocele/Vault Repair/Cystoscopy and Graft  . Vaginal prolapse repair  09/18/2012    Procedure: VAGINAL VAULT SUSPENSION;  Surgeon: Reece Packer, MD;  Location: [facility];  Service: Urology;  Laterality: N/A;  . Cystoscopy  09/18/2012    Procedure: CYSTOSCOPY;  Surgeon: Reece Packer, MD;  Location: [facility];  Service: Urology;  Laterality: N/A;  . Rectocele repair N/A 10/15/2013    Procedure: RECTOCELE REPAIR WITH GRAFT  ANAD VAULT SUSPINSION;  Surgeon: Reece Packer, MD;  Location: [facility];  Service: Urology;  Laterality: N/A;  . Breast lumpectomy with radioactive seed and sentinel lymph node biopsy Right 05/08/2015    Procedure: RIGHT BREAST LUMPECTOMY WITH RADIOACTIVE SEED AND RIGHT AXILLARY SENTINEL LYMPH NODE BIOPSY;  Surgeon: Alphonsa Overall, MD;  Location: [facility];  Service: General;  Laterality: Right;     ALLERGIES:  Allergies  Allergen Reactions  . Shellfish Allergy Shortness Of Breath  . Celebrex [Celecoxib]     "made me  feel funny"  . Claritin [Loratadine]   . Radiaplexrx [Skin Protectants, Misc.] Other (See Comments)    Contraindicated with allergy of barbiturates causing rash  . Adhesive [Tape] Rash  . Barbiturates Rash  . Codeine Rash     CURRENT MEDICATIONS:  Current Outpatient Prescriptions on File Prior to Visit  Medication Sig Dispense Refill  . ALPRAZolam (XANAX) 0.5 MG tablet Take 0.5 tablets (0.25 mg total) by mouth 2 (two) times daily as needed for anxiety. 60 tablet 0  . anastrozole (ARIMIDEX) 1 MG tablet Take 1 tablet (1 mg total) by mouth daily. 90 tablet 3  . aspirin 81 MG tablet Take 81 mg by mouth daily.    Marland Kitchen b complex vitamins tablet Take 1 tablet by mouth daily.    . benzonatate (TESSALON) 200 MG capsule TK 1 C PO TID  0  . levothyroxine (SYNTHROID, LEVOTHROID) 175 MCG tablet Take 175 mcg by mouth daily before breakfast.    . lisinopril (PRINIVIL,ZESTRIL) 10 MG tablet Take 10 mg by mouth every morning.    Marland Kitchen oxybutynin (DITROPAN) 5 MG tablet Take 5 mg by mouth daily.     Marland Kitchen PROAIR RESPICLICK 790 (90 BASE) MCG/ACT AEPB   0  . traMADol (  ULTRAM) 50 MG tablet Take 1-2 tablets (50-100 mg total) by mouth every 6 (six) hours as needed. 30 tablet 1  . zolpidem (AMBIEN) 5 MG tablet     . Calcium Carbonate (CALCIUM 600 PO) Take by mouth daily.    . Cholecalciferol (VITAMIN D PO) Take 600 mg by mouth daily.    . Magnesium 500 MG CAPS Take by mouth daily.    . non-metallic deodorant Jethro Poling) MISC Apply 1 application topically daily as needed.    . nystatin (MYCOSTATIN) 100000 UNIT/ML suspension TK 5 ML PO QID FOR 7 DAYS  1   No current facility-administered medications on file prior to visit.     ONCOLOGIC FAMILY HISTORY:  Family History  Problem Relation Age of Onset  . Prostate cancer Brother   . Prostate cancer Brother      GENETIC COUNSELING/TESTING: None  SOCIAL HISTORY:  Debra Kelly is single and is about to move into a new home in [place], [place].  She has a boyfriend of 11  years.    She has a daughter with MS who is doing well.   Debra Kelly is currently retired.  She denies any current or history of tobacco or illicit drug use.  She does report social alcohol use, approximately 1 drink / week.   PHYSICAL EXAMINATION:  Vital Signs:   Filed Vitals:   09/24/15 0843  BP: 157/73  Pulse: 94  Temp: 98.1 F (36.7 C)  Resp: 18   General: Well-nourished, well-appearing female in no acute distress.  She is unaccompanied in clinic today.   HEENT: Head is atraumatic and normocephalic.  Pupils equal and reactive to light and accomodation. Conjunctivae clear without exudate.  Sclerae anicteric. Oral mucosa is pink, moist, and intact without lesions.  Oropharynx is pink without lesions or erythema.  Lymph: No cervical, supraclavicular, infraclavicular, or axillary lymphadenopathy noted on palpation.  Cardiovascular: Regular rate and rhythm without murmurs, rubs, or gallops. Respiratory: Clear to auscultation bilaterally. Chest expansion symmetric without accessory muscle use on inspiration or expiration.  Breast: Lumpectomy scar along right breast intact without nodularity. No erythema over right breast. GI: Abdomen soft and round. No tenderness to palpation. Bowel sounds normoactive in 4 quadrants. .   GU: Deferred.  Musculoskeletal: Muscle strength 5/5 in all extremities.    Neuro: No focal deficits. Steady gait.  Psych: Mood and affect normal and appropriate for situation.  Extremities: No edema, cyanosis, or clubbing.  Skin: Warm and dry. Slight redness along left upper arm without lesion.    LABORATORY DATA:  None for this visit.  DIAGNOSTIC IMAGING:  None for this visit.     ASSESSMENT AND PLAN:   1. 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   Debra Kelly is doing well today without clinical symptoms worrisome for recurrence. She will follow-up with her medical oncologist,  Dr. Lindi Adie, in December 2016 history and physical exam per surveillance protocol.  She will continue her anti-estrogen therapy with anastrozole as prescribed by  Dr. Lindi Adie. She was instructed to make Dr. Lindi Adie or myself aware if she begins to experience any side effects of the medication and I could see her back in clinic to help manage those side effects, as needed. I do not believe the rash that she is experiencing is related to the anastrozole.  A  comprehensive survivorship care plan and treatment summary was reviewed with the patient today detailing her breast cancer diagnosis, treatment course, potential late/long-term effects of treatment, appropriate follow-up care with recommendations for the future, and patient education resources.  A copy of this summary, along with a letter will be sent to the patient's primary care provider via in basket message after today's visit.  Debra Kelly is welcome to return to the Survivorship Clinic in the future, as needed; no follow-up will be scheduled at this time.    2. Cough: I believe that Debra Kelly complaints of cough are related to her episode of bronchitis rather than the history of breast cancer. She is afebrile and lungs are clear today.  If she is still experiencing a cough at her upcoming CPE appointment in November, we could consider doing a chest x-ray at that time to evaluate for any underlying infection or other etiology.  3. Bone health:  Given Debra Kelly's age/history of breast cancer and her current treatment regimen including anti-estrogen therapy with anastrozole, she is at risk for bone demineralization.  Per our records, her last DEXA scan was in January 2016 revealing osteopenia.  In the meantime, she was encouraged to increase her consumption of foods rich in calcium and vitamin D as well as to increase her weight-bearing  activities.  She does take a calcium and vitamin D supplement and plans to resume her walking soon. She was given education on specific activities to promote bone health.  4. Cancer screening:  Due to Debra Kelly's history and her age, she should receive screening for skin cancers and colon cancer.  The information and recommendations are listed on the patient's comprehensive care plan/treatment summary and were reviewed in detail with the patient.    5. Health maintenance and wellness promotion: Debra Kelly was encouraged to consume 5-7 servings of fruits and vegetables per day. We reviewed the "Nutrition Rainbow" handout.  She was also encouraged to engage in moderate to vigorous exercise for 30 minutes per day most days of the week, beginning slow and building up to that amount. We discussed the [employer] fitness program, which is designed for cancer survivors to help them become more physically fit after cancer treatments.  She was instructed to limit her alcohol consumption and continue to abstain from tobacco use / limit her exposure to second hand smoke.     6. Support services/counseling: It is not uncommon for this period of the patient's cancer care trajectory to be one of many emotions and stressors.  We discussed an opportunity for her to participate in the next session of [facility] ("Finding Your New Normal") support group series designed for patients after they have completed treatment.   Debra Kelly was encouraged to take advantage of our many other support services programs, support groups, and/or counseling in coping with her new life as a cancer survivor after completing anti-cancer treatment.  She was offered support today through active listening and expressive supportive counseling.  She was given information regarding our available services and encouraged to contact me with any questions or for help enrolling in any of our support group/programs.    A total of 50 minutes of face-to-face time  was spent with this patient with greater than 50% of that time in counseling and care-coordination.   Sylvan Cheese, NP  Survivorship Program [facility] [phone]   Note: PRIMARY CARE PROVIDER Olin Hauser, [place] [phone]

## 2015-10-17 ENCOUNTER — Other Ambulatory Visit: Payer: Self-pay | Admitting: Hematology and Oncology

## 2015-10-17 DIAGNOSIS — C50411 Malignant neoplasm of upper-outer quadrant of right female breast: Secondary | ICD-10-CM

## 2015-10-19 ENCOUNTER — Other Ambulatory Visit: Payer: Self-pay | Admitting: *Deleted

## 2015-10-19 DIAGNOSIS — C50411 Malignant neoplasm of upper-outer quadrant of right female breast: Secondary | ICD-10-CM

## 2015-10-19 MED ORDER — ALPRAZOLAM 0.5 MG PO TABS: 0.2500 mg | ORAL_TABLET | Freq: Two times a day (BID) | ORAL | Status: AC | PRN

## 2015-11-11 ENCOUNTER — Other Ambulatory Visit: Payer: Self-pay | Admitting: Hematology and Oncology

## 2015-11-11 DIAGNOSIS — I10 Essential (primary) hypertension: Secondary | ICD-10-CM | POA: Insufficient documentation

## 2015-11-11 DIAGNOSIS — E039 Hypothyroidism, unspecified: Secondary | ICD-10-CM | POA: Insufficient documentation

## 2015-11-12 ENCOUNTER — Other Ambulatory Visit: Payer: Self-pay | Admitting: *Deleted

## 2015-11-12 DIAGNOSIS — C50411 Malignant neoplasm of upper-outer quadrant of right female breast: Secondary | ICD-10-CM

## 2015-11-12 MED ORDER — ANASTROZOLE 1 MG PO TABS: 1.0000 mg | ORAL_TABLET | Freq: Every day | ORAL | Status: DC

## 2015-11-17 ENCOUNTER — Encounter: Payer: Self-pay | Admitting: Hematology and Oncology

## 2015-11-17 ENCOUNTER — Ambulatory Visit (HOSPITAL_BASED_OUTPATIENT_CLINIC_OR_DEPARTMENT_OTHER): Payer: Medicare Other | Admitting: Hematology and Oncology

## 2015-11-17 ENCOUNTER — Telehealth: Payer: Self-pay | Admitting: Hematology and Oncology

## 2015-11-17 VITALS — BP 132/64 | HR 94 | Temp 98.0°F | Resp 18 | Ht 64.0 in | Wt 191.2 lb

## 2015-11-17 DIAGNOSIS — C50411 Malignant neoplasm of upper-outer quadrant of right female breast: Secondary | ICD-10-CM | POA: Diagnosis present

## 2015-11-17 DIAGNOSIS — L299 Pruritus, unspecified: Secondary | ICD-10-CM | POA: Diagnosis not present

## 2015-11-17 DIAGNOSIS — L309 Dermatitis, unspecified: Secondary | ICD-10-CM | POA: Insufficient documentation

## 2015-11-17 NOTE — Addendum Note (Signed)
Addended by: Prentiss Bells on: 11/17/2015 12:21 PM   Modules accepted: Orders, Medications

## 2015-11-17 NOTE — Assessment & Plan Note (Signed)
Right breast lumpectomy 05/08/15: Invasive lobular cancer grade 2/31.3 cm with LCIS, 0/1 lymph nodes; margins negative, LCIS, ER 100%, PR 100%, HER-2 negative ratio 1.36, Ki-67 11%, T1 cN0 M0 stage IA, Oncotype DX score 14, 9% risk of recurrence, status post radiation completed 08/04/2015  Current treatment: Anastrozole 1 mg daily 5 years started 08/18/2015 Profound itching:  Anxiety: We'll renew Xanax today. Patient has retired and she feels remarkably well off and relaxed.  Return to clinic in 6 months for follow-up

## 2015-11-17 NOTE — Progress Notes (Signed)
Patient Care Team: Olin Hauser, MD as PCP - General (Family Medicine) Alphonsa Overall, MD as Consulting Physician (General Surgery) Nicholas Lose, MD as Consulting Physician (Hematology and Oncology) Thea Silversmith, MD as Consulting Physician (Radiation Oncology) Mauro Kaufmann, RN as Registered Nurse Rockwell Germany, RN as Registered Nurse Holley Bouche, NP as Nurse Practitioner (Nurse Practitioner) Sylvan Cheese, NP as Nurse Practitioner (Hematology and Oncology)  DIAGNOSIS: Breast cancer of upper-outer quadrant of right female breast [facility])   Staging form: Breast, AJCC 7th Edition     Clinical stage from 04/22/2015: Stage IA (T1b, N0, M0) - Unsigned       Staging comments: Staged at breast conference on 5.11.16      Pathologic stage from 05/13/2015: Stage IA (T1c, N0, cM0) - Signed by Enid Cutter, MD on 05/18/2015       Staging comments: Staged on final lumpectomy specimen by Dr. Lyndon Code    SUMMARY OF ONCOLOGIC HISTORY:   Breast cancer of upper-outer quadrant of right female breast ([place])   04/03/2015 Mammogram LEFT breast: circumscribed oval mass in the outer quadrant measuring ~12 mm. This is posterior to a second oval mass that is unchanged from prior exams. 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 No chemotherapy Lindi Adie).   05/08/2015 Pathologic  Stage Stage IA: pT1c pN0   07/01/2015 - 08/04/2015 Radiation Therapy Adjuvant RT Pablo Ledger): Right breast 50 Gy over 25 fractions   08/17/2015 -  Anti-estrogen oral therapy Anastrozole 1 mg daily (Gudena). Planned duration of therapy 5 years.   09/24/2015 Survivorship Survivorship visit completed and copy of care plan provided to patient.    CHIEF COMPLIANT: follow-up on anastrozole  INTERVAL HISTORY: Debra Kelly is a 74 year old with above-mentioned history of right breast cancer treated with lumpectomy radiation and is currently on anastrozole. She was complaining of lot of itching which has subsided. Recently she had profound bronchitis repeated which required multiple rounds of antibiotics and it finally improved. She has moved to a new house recently. Her boyfriend lives in [place].she denies any lumps or nodules in the breasts.  REVIEW OF SYSTEMS:   Constitutional: Denies fevers, chills or abnormal weight loss Eyes: Denies blurriness of vision Ears, nose, mouth, throat, and face: Denies mucositis or sore throat Respiratory: Denies cough, dyspnea or wheezes Cardiovascular: Denies palpitation, chest discomfort or lower extremity swelling Gastrointestinal:  Denies nausea, heartburn or change in bowel habits Skin: Denies abnormal skin rashes Lymphatics: Denies new lymphadenopathy or easy bruising Neurological:Denies numbness, tingling or new weaknesses Behavioral/Psych: Mood is stable, no new changes  Breast:  denies any pain or lumps or nodules in either breasts All other systems were reviewed with the patient and are negative.  I have reviewed the past medical history, past surgical history, social history and family history with the patient and they are unchanged from previous note.  ALLERGIES:  is allergic to shellfish allergy; celebrex;  claritin; radiaplexrx; adhesive; barbiturates; and codeine.  MEDICATIONS:  Current Outpatient Prescriptions  Medication Sig Dispense Refill  .  ALPRAZolam (XANAX) 0.5 MG tablet Take 0.5 tablets (0.25 mg total) by mouth 2 (two) times daily as needed for anxiety. 60 tablet 0  . anastrozole (ARIMIDEX) 1 MG tablet Take 1 tablet (1 mg total) by mouth daily. 90 tablet 3  . aspirin 81 MG tablet Take 81 mg by mouth daily.    Marland Kitchen b complex vitamins tablet Take 1 tablet by mouth daily.    . benzonatate (TESSALON) 200 MG capsule TK 1 C PO TID  0  . Calcium Carbonate (CALCIUM 600 PO) Take by mouth daily.    . Cholecalciferol (VITAMIN D PO) Take 600 mg by mouth daily.    Marland Kitchen levothyroxine (SYNTHROID, LEVOTHROID) 175 MCG tablet Take 175 mcg by mouth daily before breakfast.    . lisinopril (PRINIVIL,ZESTRIL) 10 MG tablet Take 10 mg by mouth every morning.    . Magnesium 500 MG CAPS Take by mouth daily.    . non-metallic deodorant Jethro Poling) MISC Apply 1 application topically daily as needed.    . nystatin (MYCOSTATIN) 100000 UNIT/ML suspension TK 5 ML PO QID FOR 7 DAYS  1  . oxybutynin (DITROPAN) 5 MG tablet Take 5 mg by mouth daily.     Marland Kitchen PROAIR RESPICLICK 578 (90 BASE) MCG/ACT AEPB   0  . traMADol (ULTRAM) 50 MG tablet Take 1-2 tablets (50-100 mg total) by mouth every 6 (six) hours as needed. 30 tablet 1  . zolpidem (AMBIEN) 5 MG tablet      No current facility-administered medications for this visit.    PHYSICAL EXAMINATION: ECOG PERFORMANCE STATUS: 1 - Symptomatic but completely ambulatory  Filed Vitals:   11/17/15 1023  BP: 132/64  Pulse: 94  Temp: 98 F (36.7 C)  Resp: 18   Filed Weights   11/17/15 1023  Weight: 191 lb 3.2 oz (86.728 kg)    GENERAL:alert, no distress and comfortable SKIN: skin color, texture, turgor are normal, no rashes or significant lesions EYES: normal, Conjunctiva are pink and non-injected, sclera clear OROPHARYNX:no exudate, no erythema and lips, buccal mucosa, and tongue normal  NECK: supple, thyroid normal size, non-tender, without nodularity LYMPH:  no palpable lymphadenopathy in the cervical, axillary or  inguinal LUNGS: clear to auscultation and percussion with normal breathing effort HEART: regular rate & rhythm and no murmurs and no lower extremity edema ABDOMEN:abdomen soft, non-tender and normal bowel sounds Musculoskeletal:no cyanosis of digits and no clubbing  NEURO: alert & oriented x 3 with fluent speech, no focal motor/sensory deficits  LABORATORY DATA:  I have reviewed the data as listed   Chemistry      Component Value Date/Time   NA 142 04/22/2015 0804   NA 142 10/16/2013 0537   K 4.2 04/22/2015 0804   K 3.7 10/16/2013 0537   CL 106 10/16/2013 0537   CO2 26 04/22/2015 0804   CO2 30 10/16/2013 0537   BUN 15.1 04/22/2015 0804   BUN 6 10/16/2013 0537   CREATININE 0.7 04/22/2015 0804   CREATININE 0.70 10/16/2013 0537      Component Value Date/Time   CALCIUM 9.0 04/22/2015 0804   CALCIUM 9.0 10/16/2013 0537   ALKPHOS 67 04/22/2015 0804   AST 13 04/22/2015 0804   ALT 13 04/22/2015 0804   BILITOT 0.50 04/22/2015 0804       Lab Results  Component Value Date   WBC 5.2 04/22/2015   HGB 13.2 04/22/2015  HCT 40.4 04/22/2015   MCV 92.9 04/22/2015   PLT 259 04/22/2015   NEUTROABS 2.9 04/22/2015   ASSESSMENT & PLAN:  Breast cancer of upper-outer quadrant of right female breast Right breast lumpectomy 05/08/15: 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 She has recently moved to a new house and sold her old house and she is excited about going on  in her camper to travel a lot and enjoying her retirement.  Return to clinic in 6 months for follow-up   No orders of the defined types were placed in this encounter.   The patient has a good understanding of the overall plan. she agrees with it. she will call with any problems that may develop before the next visit here.   Rulon Eisenmenger, MD 11/17/2015

## 2015-11-17 NOTE — Telephone Encounter (Signed)
Appointments made and avs printed for pateint °

## 2015-11-20 ENCOUNTER — Encounter: Payer: Self-pay | Admitting: Adult Health

## 2015-11-20 NOTE — Progress Notes (Signed)
A birthday card was mailed to the patient today on behalf of the Survivorship Program at [facility].   Gretchen Dawson, NP Survivorship Program [facility] [phone]  

## 2015-12-15 DIAGNOSIS — C50911 Malignant neoplasm of unspecified site of right female breast: Secondary | ICD-10-CM | POA: Insufficient documentation

## 2016-02-23 DIAGNOSIS — D510 Vitamin B12 deficiency anemia due to intrinsic factor deficiency: Secondary | ICD-10-CM | POA: Insufficient documentation

## 2016-03-03 ENCOUNTER — Other Ambulatory Visit: Payer: Self-pay | Admitting: Obstetrics and Gynecology

## 2016-03-03 DIAGNOSIS — Z853 Personal history of malignant neoplasm of breast: Secondary | ICD-10-CM

## 2016-04-05 ENCOUNTER — Ambulatory Visit
Admission: RE | Admit: 2016-04-05 | Discharge: 2016-04-05 | Disposition: A | Discharge status: Home / Self Care | Payer: Medicare Other | Source: Ambulatory Visit | Attending: Obstetrics and Gynecology | Admitting: Obstetrics and Gynecology

## 2016-04-05 DIAGNOSIS — Z853 Personal history of malignant neoplasm of breast: Secondary | ICD-10-CM

## 2016-05-18 ENCOUNTER — Encounter: Payer: Self-pay | Admitting: Hematology and Oncology

## 2016-05-18 ENCOUNTER — Ambulatory Visit (HOSPITAL_BASED_OUTPATIENT_CLINIC_OR_DEPARTMENT_OTHER): Payer: Medicare Other | Admitting: Hematology and Oncology

## 2016-05-18 ENCOUNTER — Telehealth: Payer: Self-pay | Admitting: Hematology and Oncology

## 2016-05-18 VITALS — BP 143/70 | HR 80 | Temp 98.2°F | Resp 18 | Ht 64.0 in | Wt 184.3 lb

## 2016-05-18 DIAGNOSIS — C50411 Malignant neoplasm of upper-outer quadrant of right female breast: Secondary | ICD-10-CM

## 2016-05-18 DIAGNOSIS — Z79811 Long term (current) use of aromatase inhibitors: Secondary | ICD-10-CM

## 2016-05-18 DIAGNOSIS — F411 Generalized anxiety disorder: Secondary | ICD-10-CM | POA: Diagnosis not present

## 2016-05-18 DIAGNOSIS — L299 Pruritus, unspecified: Secondary | ICD-10-CM | POA: Diagnosis not present

## 2016-05-18 DIAGNOSIS — Z923 Personal history of irradiation: Secondary | ICD-10-CM

## 2016-05-18 DIAGNOSIS — Z170 Estrogen receptor positive status [ER+]: Secondary | ICD-10-CM

## 2016-05-18 NOTE — Telephone Encounter (Signed)
appt made and avs printed °

## 2016-05-18 NOTE — Progress Notes (Signed)
Patient Care Team: Olin Hauser, MD as PCP - General (Family Medicine) Alphonsa Overall, MD as Consulting Physician (General Surgery) Nicholas Lose, MD as Consulting Physician (Hematology and Oncology) Thea Silversmith, MD as Consulting Physician (Radiation Oncology) Mauro Kaufmann, RN as Registered Nurse Rockwell Germany, RN as Registered Nurse Holley Bouche, NP as Nurse Practitioner (Nurse Practitioner) Sylvan Cheese, NP as Nurse Practitioner (Hematology and Oncology)  DIAGNOSIS: Breast cancer of upper-outer quadrant of right female breast [facility])   Staging form: Breast, AJCC 7th Edition     Clinical stage from 04/22/2015: Stage IA (T1b, N0, M0) - Unsigned       Staging comments: Staged at breast conference on 5.11.16      Pathologic stage from 05/13/2015: Stage IA (T1c, N0, cM0) - Signed by Enid Cutter, MD on 05/18/2015       Staging comments: Staged on final lumpectomy specimen by Dr. Lyndon Code  SUMMARY OF ONCOLOGIC HISTORY:   Breast cancer of upper-outer quadrant of right female breast ([place])   04/03/2015 Mammogram LEFT breast: circumscribed oval mass in the outer quadrant measuring ~12 mm. This is posterior to a second oval mass that is unchanged from prior exams. 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 No chemotherapy Lindi Adie).   05/08/2015 Pathologic Stage  Stage IA: pT1c pN0   07/01/2015 - 08/04/2015 Radiation Therapy Adjuvant RT Pablo Ledger): Right breast 50 Gy over 25 fractions   08/17/2015 -  Anti-estrogen oral therapy Anastrozole 1 mg daily (Raegan Sipp). Planned duration of therapy 5 years.   09/24/2015 Survivorship Survivorship visit completed and copy of care plan provided to patient.    CHIEF COMPLIANT: Follow-up on anastrozole  INTERVAL HISTORY: Debra Kelly is a 75 year old with above-mentioned history of right breast cancer treated with lumpectomy and adjuvant radiation is currently on anastrozole therapy. She appears to be tolerating anastrozole extremely well. She was found to be B-12 deficient and was started on B-12 replacement. Since then she has been doing much better. Denies any lumps or nodules in the breasts. She does have occasional pain in the right arm when she does a lot of physical activity.  REVIEW OF SYSTEMS:   Constitutional: Denies fevers, chills or abnormal weight loss Eyes: Denies blurriness of vision Ears, nose, mouth, throat, and face: Denies mucositis or sore throat Respiratory: Denies cough, dyspnea or wheezes Cardiovascular: Denies palpitation, chest discomfort Gastrointestinal:  Denies nausea, heartburn or change in bowel habits Skin: Denies abnormal skin rashes Lymphatics: Denies new lymphadenopathy or easy bruising Neurological:Denies numbness, tingling or new weaknesses Behavioral/Psych: Mood is stable, no new changes  Extremities: No lower extremity edema Breast:  denies any pain or lumps or nodules in either breasts All other systems were reviewed with the patient and are negative.  I have reviewed the past medical history, past surgical history, social history and family history with the patient and they are unchanged from previous note.  ALLERGIES:  is allergic to shellfish allergy; celebrex; claritin; radiaplexrx; adhesive; barbiturates; and codeine.  MEDICATIONS:  Current Outpatient Prescriptions    Medication Sig Dispense Refill  . ALPRAZolam (XANAX) 0.5 MG tablet Take 0.5 tablets (0.25 mg total) by mouth 2 (two) times daily as needed for anxiety. 60 tablet 0  . anastrozole (ARIMIDEX) 1 MG tablet Take 1 tablet (1 mg total) by mouth daily. 90 tablet 3  . aspirin 81 MG tablet Take 81 mg by mouth daily.    Marland Kitchen b complex vitamins tablet Take 1 tablet by mouth daily.    . benzonatate (TESSALON) 200 MG capsule TK 1 C PO TID  0  . Calcium Carbonate (CALCIUM 600 PO) Take by mouth daily.    . Cholecalciferol (VITAMIN D PO) Take 600 mg by mouth daily.    Marland Kitchen HYDROMET 5-1.5 MG/5ML syrup TK  5 ML PO TID PRN  0  . levothyroxine (SYNTHROID, LEVOTHROID) 175 MCG tablet Take 175 mcg by mouth daily before breakfast.    . lisinopril (PRINIVIL,ZESTRIL) 10 MG tablet Take 10 mg by mouth every morning.    . Magnesium 500 MG CAPS Take by mouth daily.    Marland Kitchen oxybutynin (DITROPAN) 5 MG tablet Take 5 mg by mouth daily.     Marland Kitchen PROAIR RESPICLICK 161 (90 BASE) MCG/ACT AEPB   0  . traMADol (ULTRAM) 50 MG tablet Take 1-2 tablets (50-100 mg total) by mouth every 6 (six) hours as needed. 30 tablet 1  . triamcinolone cream (KENALOG) 0.1 %   1  . zolpidem (AMBIEN) 5 MG tablet      No current facility-administered medications for this visit.    PHYSICAL EXAMINATION: ECOG PERFORMANCE STATUS: 1 - Symptomatic but completely ambulatory  Filed Vitals:   05/18/16 1021  BP: 143/70  Pulse: 80  Temp: 98.2 F (36.8 C)  Resp: 18   Filed Weights   05/18/16 1021  Weight: 184 lb 4.8 oz (83.598 kg)    GENERAL:alert, no distress and comfortable SKIN: skin color, texture, turgor are normal, no rashes or significant lesions EYES: normal, Conjunctiva are pink and non-injected, sclera clear OROPHARYNX:no exudate, no erythema and lips, buccal mucosa, and tongue normal  NECK: supple, thyroid normal size, non-tender, without nodularity LYMPH:  no palpable lymphadenopathy in the cervical, axillary or inguinal LUNGS: clear to  auscultation and percussion with normal breathing effort HEART: regular rate & rhythm and no murmurs and no lower extremity edema ABDOMEN:abdomen soft, non-tender and normal bowel sounds MUSCULOSKELETAL:no cyanosis of digits and no clubbing  NEURO: alert & oriented x 3 with fluent speech, no focal motor/sensory deficits EXTREMITIES: No lower extremity edema BREAST: No palpable masses or nodules in either right or left breasts. No palpable axillary supraclavicular or infraclavicular adenopathy no breast tenderness or nipple discharge. (exam performed in the presence of a chaperone)  LABORATORY DATA:  I have reviewed the data as listed   Chemistry      Component Value Date/Time   NA 142 04/22/2015 0804   NA 142 10/16/2013 0537   K 4.2 04/22/2015 0804   K 3.7 10/16/2013 0537   CL 106 10/16/2013 0537   CO2 26 04/22/2015 0804   CO2 30 10/16/2013 0537   BUN 15.1 04/22/2015 0804   BUN 6 10/16/2013 0537   CREATININE 0.7 04/22/2015 0804   CREATININE 0.70 10/16/2013 0537      Component Value Date/Time   CALCIUM 9.0 04/22/2015 0804   CALCIUM 9.0 10/16/2013 0537   ALKPHOS 67 04/22/2015 0804  AST 13 04/22/2015 0804   ALT 13 04/22/2015 0804   BILITOT 0.50 04/22/2015 0804       Lab Results  Component Value Date   WBC 5.2 04/22/2015   HGB 13.2 04/22/2015   HCT 40.4 04/22/2015   MCV 92.9 04/22/2015   PLT 259 04/22/2015   NEUTROABS 2.9 04/22/2015   ASSESSMENT & PLAN:  Breast cancer of upper-outer quadrant of right female breast Right breast lumpectomy 05/08/15: 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  She moved to a new house and sold her old house and she is excited about going on in her camper to travel a lot and enjoying her retirement.  Return to clinic in 6 months for follow-up   No orders of the defined types were placed in this encounter.   The patient has a good understanding of the overall plan. she agrees with it. she will call with any problems that may develop before the next visit here.   Rulon Eisenmenger, MD 05/18/2016

## 2016-05-18 NOTE — Assessment & Plan Note (Signed)
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  She has recently moved to a new house and sold her old house and she is excited about going on in her camper to travel a lot and enjoying her retirement.  Return to clinic in 6 months for follow-up

## 2016-10-28 ENCOUNTER — Other Ambulatory Visit: Payer: Self-pay | Admitting: Hematology and Oncology

## 2016-10-28 DIAGNOSIS — C50411 Malignant neoplasm of upper-outer quadrant of right female breast: Secondary | ICD-10-CM

## 2016-11-16 NOTE — Assessment & Plan Note (Signed)
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  She moved to a new house and sold her old house and she is excited about going on in her camper to travel a lot and enjoying her retirement.  Return to clinic in 1 year for follow-up

## 2016-11-17 ENCOUNTER — Encounter: Payer: Self-pay | Admitting: Hematology and Oncology

## 2016-11-17 ENCOUNTER — Ambulatory Visit (HOSPITAL_BASED_OUTPATIENT_CLINIC_OR_DEPARTMENT_OTHER): Payer: Medicare Other | Admitting: Hematology and Oncology

## 2016-11-17 DIAGNOSIS — C50411 Malignant neoplasm of upper-outer quadrant of right female breast: Secondary | ICD-10-CM

## 2016-11-17 DIAGNOSIS — Z79811 Long term (current) use of aromatase inhibitors: Secondary | ICD-10-CM

## 2016-11-17 DIAGNOSIS — F411 Generalized anxiety disorder: Secondary | ICD-10-CM | POA: Diagnosis not present

## 2016-11-17 DIAGNOSIS — L503 Dermatographic urticaria: Secondary | ICD-10-CM | POA: Diagnosis not present

## 2016-11-17 DIAGNOSIS — Z170 Estrogen receptor positive status [ER+]: Principal | ICD-10-CM

## 2016-11-17 MED ORDER — LOSARTAN POTASSIUM 50 MG PO TABS: 50.0000 mg | ORAL_TABLET | Freq: Every day | ORAL | Status: DC

## 2016-11-17 MED ORDER — TEMAZEPAM 30 MG PO CAPS: 30.0000 mg | ORAL_CAPSULE | Freq: Every evening | ORAL | 0 refills | Status: DC | PRN

## 2016-11-17 MED ORDER — POTASSIUM 99 MG PO TABS: 1.0000 | ORAL_TABLET | Freq: Two times a day (BID) | ORAL | 0 refills | Status: AC

## 2016-11-17 MED ORDER — DICLOFENAC SODIUM 75 MG PO TBEC: 75.0000 mg | DELAYED_RELEASE_TABLET | Freq: Two times a day (BID) | ORAL | Status: DC

## 2016-11-17 NOTE — Progress Notes (Signed)
Patient Care Team: Olin Hauser, MD as PCP - General (Family Medicine) Alphonsa Overall, MD as Consulting Physician (General Surgery) Nicholas Lose, MD as Consulting Physician (Hematology and Oncology) Thea Silversmith, MD as Consulting Physician (Radiation Oncology) Mauro Kaufmann, RN as Registered Nurse Rockwell Germany, RN as Registered Nurse Holley Bouche, NP as Nurse Practitioner (Nurse Practitioner) Sylvan Cheese, NP as Nurse Practitioner (Hematology and Oncology)  DIAGNOSIS:  Encounter Diagnosis  Name Primary?  . Malignant neoplasm of upper-outer quadrant of right breast in female, estrogen receptor positive ([place])     SUMMARY OF ONCOLOGIC HISTORY:   Breast cancer of upper-outer quadrant of right female breast ([place])   04/03/2015 Mammogram    LEFT breast: circumscribed oval mass in the outer quadrant measuring ~12 mm. This is posterior to a second oval mass that is unchanged from prior exams. 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 No chemotherapy Lindi Adie).      05/08/2015 Pathologic Stage    Stage IA: pT1c pN0      07/01/2015 - 08/04/2015 Radiation Therapy    Adjuvant RT Pablo Ledger): Right breast 50 Gy over 25 fractions      08/17/2015 -  Anti-estrogen oral therapy    Anastrozole 1 mg daily  (Keziah Drotar). Planned duration of therapy 5 years.      09/24/2015 Survivorship    Survivorship visit completed and copy of care plan provided to patient.       CHIEF COMPLIANT: Follow-up on anastrozole therapy  INTERVAL HISTORY: Debra Kelly is a 75 year old with above-mentioned history of right breast cancer treated with lumpectomy and adjuvant radiation. She is currently on anastrozole since September 2016 she denies any hot flashes or myalgias.. She appears to be tolerating anastrozole very well. She denies any lumps or nodules in the breasts.Patient had a fall on her knees in July 2017. Since then she has been struggling with pain and discomfort and bruising. She is currently on diclofenac.  REVIEW OF SYSTEMS:   Constitutional: Denies fevers, chills or abnormal weight loss Eyes: Denies blurriness of vision Ears, nose, mouth, throat, and face: Denies mucositis or sore throat Respiratory: Denies cough, dyspnea or wheezes Cardiovascular: Denies palpitation, chest discomfort Gastrointestinal:  Denies nausea, heartburn or change in bowel habits Skin: Denies abnormal skin rashes Lymphatics: Denies new lymphadenopathy or easy bruising Neurological:Denies numbness, tingling or new weaknesses Behavioral/Psych: Mood is stable, no new changes  Extremities: Right knee bruised Breast:  denies any pain or lumps or nodules in either breasts All other systems were reviewed with the patient and are negative.  I have reviewed the past medical history, past surgical history, social history and family history with the patient and they are unchanged from previous note.  ALLERGIES:  is allergic to shellfish allergy; celebrex [celecoxib]; claritin [loratadine]; radiaplexrx [skin protectants, misc.]; adhesive [tape]; barbiturates; and codeine.  MEDICATIONS:  Current Outpatient Prescriptions  Medication Sig Dispense Refill  . ALPRAZolam (XANAX) 0.5 MG tablet Take 0.5 tablets (0.25 mg total) by mouth 2 (two)  times daily as needed for anxiety. 60 tablet 0  . anastrozole (ARIMIDEX) 1 MG tablet TAKE 1 TABLET(1 MG) BY MOUTH DAILY 90 tablet 0  . aspirin 81 MG tablet Take 81 mg by mouth daily.    Marland Kitchen b complex vitamins tablet Take 1 tablet by mouth daily.    . benzonatate (TESSALON) 200 MG capsule TK 1 C PO TID  0  . Calcium Carbonate (CALCIUM 600 PO) Take by mouth daily.    . Cholecalciferol (VITAMIN D PO) Take 600 mg by mouth daily.    . diclofenac (VOLTAREN) 75 MG EC tablet Take 1 tablet (75 mg total) by mouth 2 (two) times daily.    Marland Kitchen levothyroxine (SYNTHROID, LEVOTHROID) 175 MCG tablet Take 175 mcg by mouth daily before breakfast.    . losartan (COZAAR) 50 MG tablet Take 1 tablet (50 mg total) by mouth daily.    . Magnesium 500 MG CAPS Take by mouth daily.    Marland Kitchen oxybutynin (DITROPAN) 5 MG tablet Take 5 mg by mouth daily.     . Potassium 99 MG TABS Take 1 tablet (99 mg total) by mouth 2 (two) times daily. 330 each 0  . temazepam (RESTORIL) 30 MG capsule Take 1 capsule (30 mg total) by mouth at bedtime as needed for sleep. 30 capsule 0  . triamcinolone cream (KENALOG) 0.1 %   1   No current facility-administered medications for this visit.     PHYSICAL EXAMINATION: ECOG PERFORMANCE STATUS: 0 - Asymptomatic  Vitals:   11/17/16 1002  BP: (!) 145/70  Pulse: 86  Resp: 18  Temp: 98.1 F (36.7 C)   Filed Weights   11/17/16 1002  Weight: 187 lb 3.2 oz (84.9 kg)    GENERAL:alert, no distress and comfortable SKIN: skin color, texture, turgor are normal, no rashes or significant lesions EYES: normal, Conjunctiva are pink and non-injected, sclera clear OROPHARYNX:no exudate, no erythema and lips, buccal mucosa, and tongue normal  NECK: supple, thyroid normal size, non-tender, without nodularity LYMPH:  no palpable lymphadenopathy in the cervical, axillary or inguinal LUNGS: clear to auscultation and percussion with normal breathing effort HEART: regular rate & rhythm and no murmurs and no lower  extremity edema ABDOMEN:abdomen soft, non-tender and normal bowel sounds MUSCULOSKELETAL:no cyanosis of digits and no clubbing  NEURO: alert & oriented x 3 with fluent speech, no focal motor/sensory deficits EXTREMITIES: No lower extremity edema BREAST: No palpable masses or nodules in either right or left breasts. No palpable axillary supraclavicular or infraclavicular adenopathy no breast tenderness or nipple discharge. (exam performed in the presence of a chaperone)  LABORATORY DATA:  I have reviewed the data as listed   Chemistry      Component Value Date/Time   NA 142 04/22/2015 0804   K 4.2 04/22/2015 0804   CL 106 10/16/2013 0537   CO2 26 04/22/2015 0804   BUN 15.1 04/22/2015 0804   CREATININE 0.7 04/22/2015 0804      Component Value Date/Time   CALCIUM 9.0 04/22/2015 0804   ALKPHOS 67 04/22/2015 0804   AST 13 04/22/2015 0804   ALT 13 04/22/2015 0804   BILITOT 0.50 04/22/2015 0804       Lab Results  Component Value Date  WBC 5.2 04/22/2015   HGB 13.2 04/22/2015   HCT 40.4 04/22/2015   MCV 92.9 04/22/2015   PLT 259 04/22/2015   NEUTROABS 2.9 04/22/2015    ASSESSMENT & PLAN:  Breast cancer of upper-outer quadrant of right female breast Right breast lumpectomy 05/08/15: 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 She fell on her knees and bruised her knees. She is still recovering from it. She moved to a new house and sold her old house  and she is excited about going on in her camper to travel a lot and enjoying her retirement.  Return to clinic in 1 year for follow-up   No orders of the defined types were placed in this encounter.  The patient has a good understanding of the overall plan. she agrees with it. she will call with any problems that may develop before the next visit here.   Rulon Eisenmenger, MD 11/17/16

## 2016-12-20 IMAGING — MG MM DIAG BREAST TOMO BILATERAL
11 of 19 series · 11 of 40 positions shown · non-contrast
Comparison: Multiple priors, the most recent is a 3D screening
mammogram dated 03/23/2015.

CLINICAL DATA: Possible masses in the bilateral breast identified
on recent screening tomogram. The patient has a history of benign
surgical excisional biopsy of the right breast in 9118. There is a
cutaneous surgical scar in the 11 o'clock region of the right
breast.

EXAM:
DIGITAL DIAGNOSTIC BILATERAL MAMMOGRAM WITH 3D TOMOSYNTHESIS WITH
CAD
ULTRASOUND BILATERAL BREAST

[R ML]
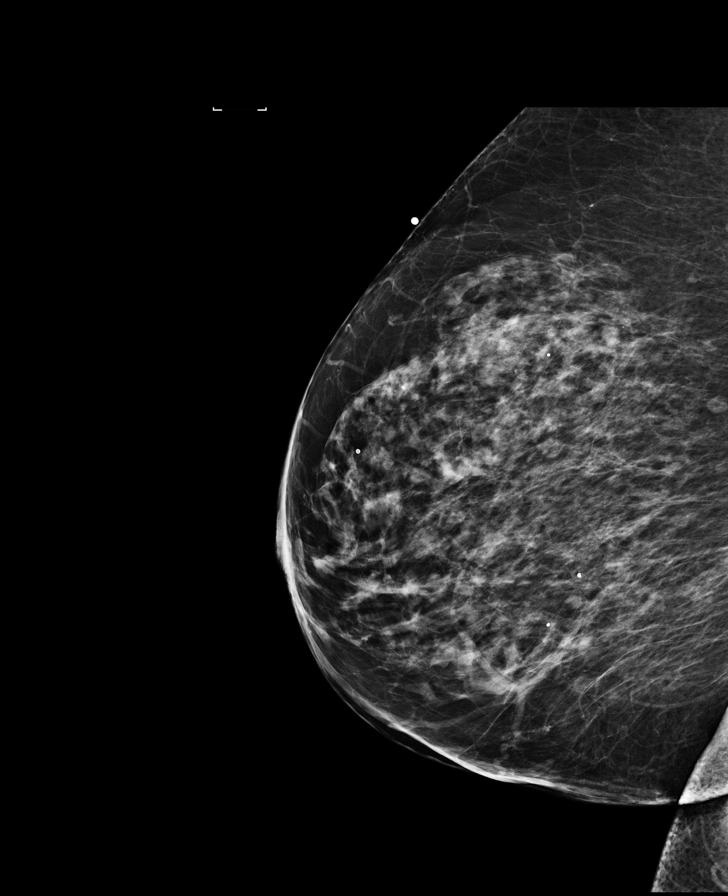

[L MLO (1 of 2)]
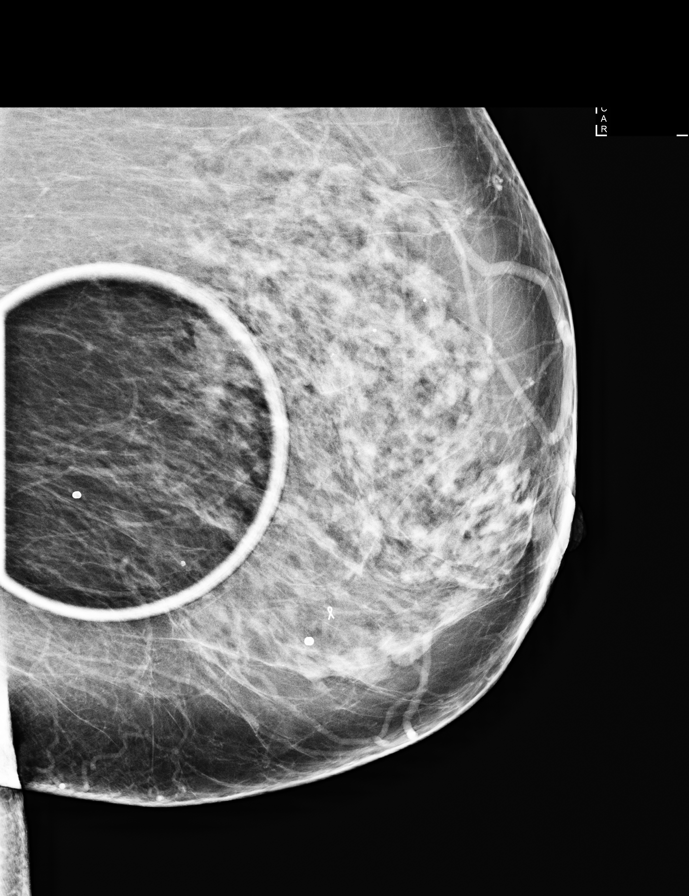

[L MLO (2 of 2)]
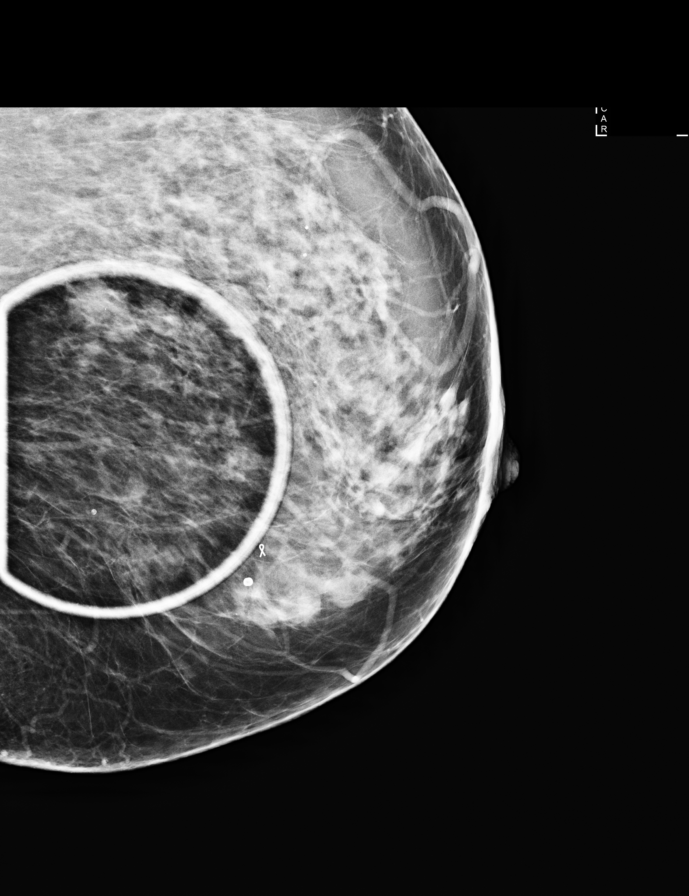

[R CC (1 of 2)]
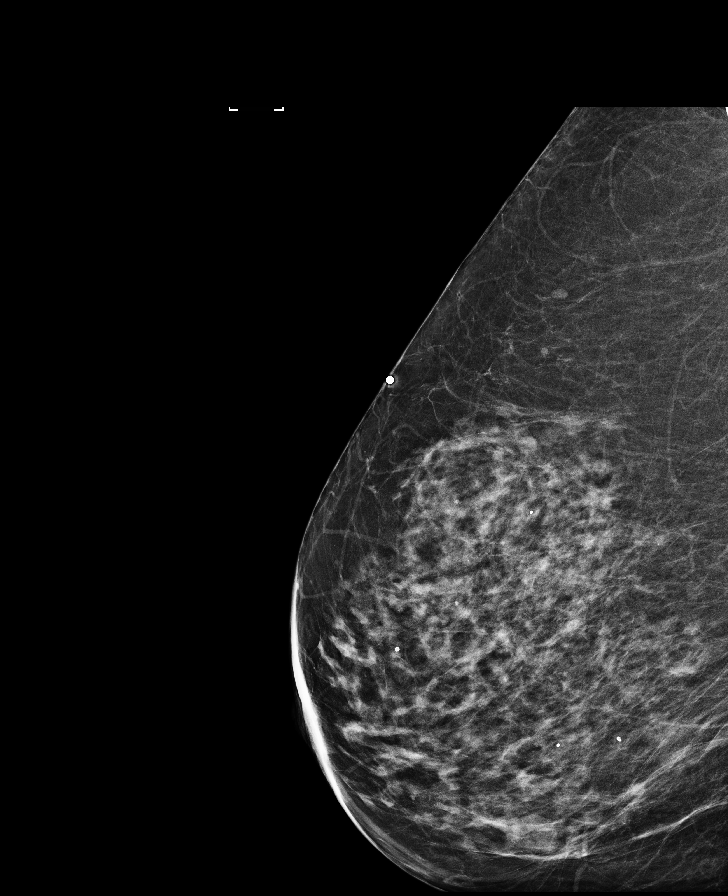

[R MLO]
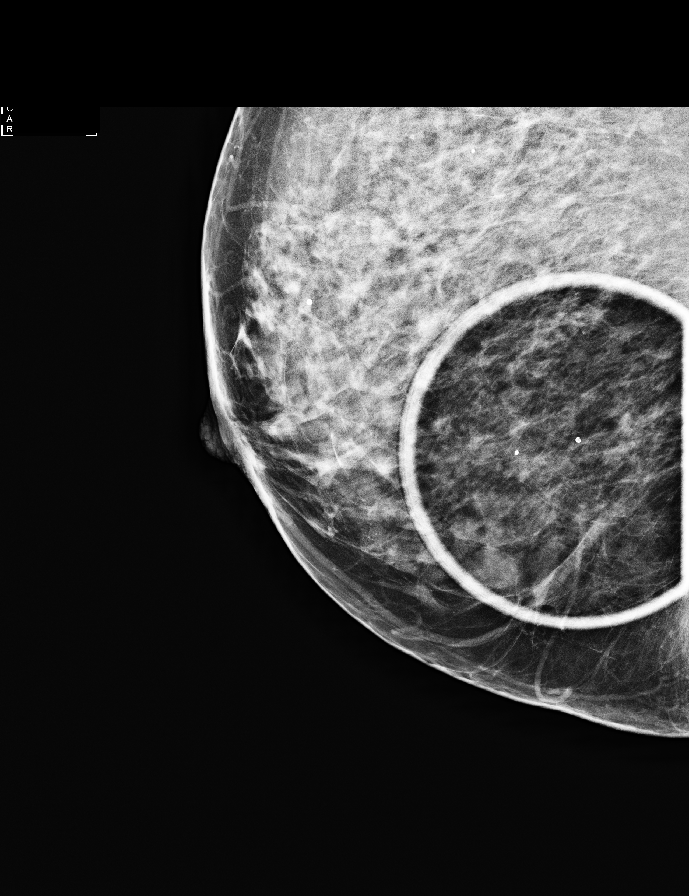

[R CC (2 of 2)]
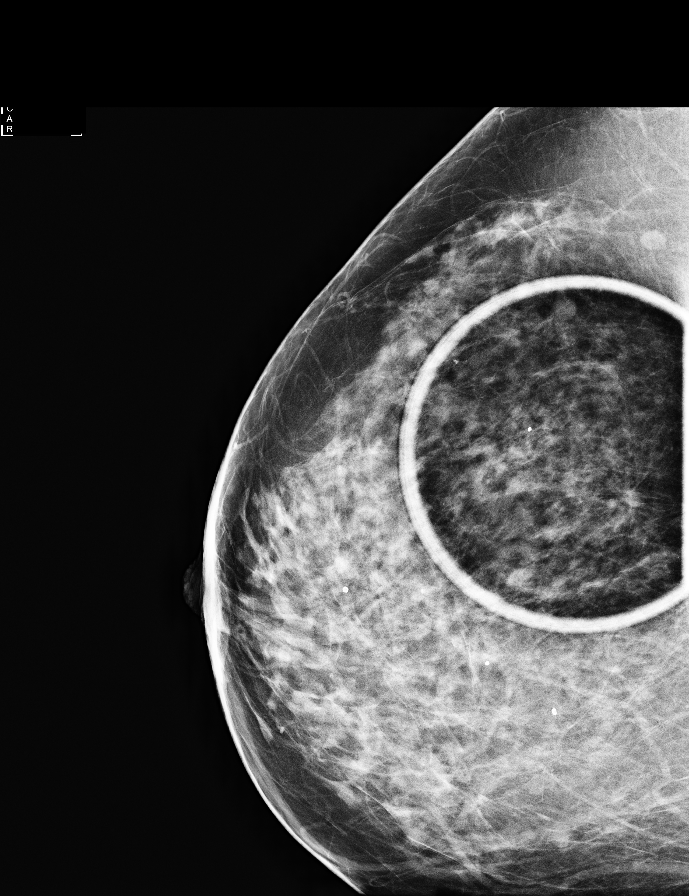

[L MLO tomo (1 of 2)]
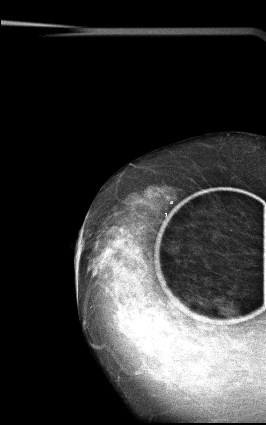

[R ML tomo]
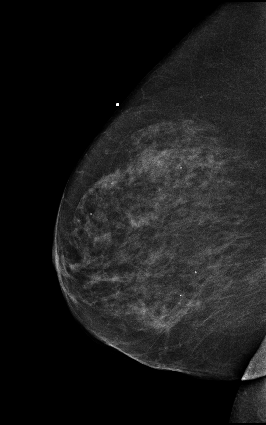

[R MLO tomo (1 of 2)]
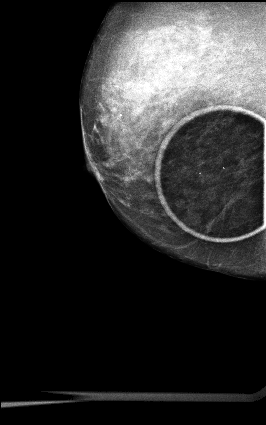

[L MLO tomo (2 of 2)]
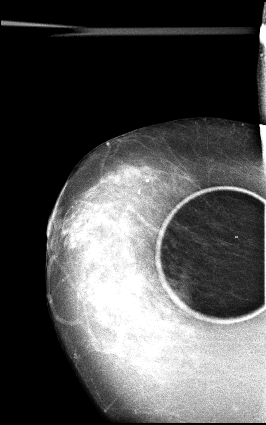

[R MLO tomo (2 of 2)]
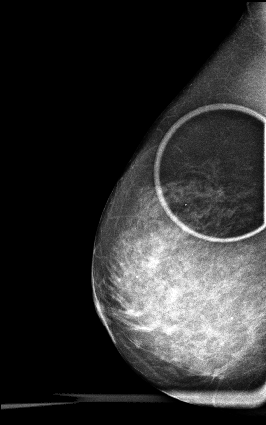

[11 of 40 positions shown; findings below may reference images not displayed]

Patient's recent screening mammogram is
the first time that she has had a 3D tomogram. Prior to 3111, the
patient's mammograms were 2D exams, including 02/14/2014,
01/31/2014, 01/17/2013, 01/09/2012, 12/28/2010

ACR Breast Density Category b: There are scattered areas of
fibroglandular density.
FINDINGS: Spot tomographic views of the left breast confirm a circumscribed
oval mass in the outer left breast that measures approximately 12
mm. This is posterior to a second oval mass that is unchanged from
prior exams.

Spot tomographic views of inferior right breast confirm a 12 mm
circumscribed nodule that is seen medially on the recent screening
mammogram.

Focal spot compression tomographic views of the slightly outer and
upper right breast confirms a small area of architectural distortion
without a definite associated mass.

Mammographic images were processed with CAD.

On physical exam, no mass is palpated in either breast.

Targeted ultrasound is performed, showing 2 cysts in the 3 o'clock
region of the left breast, one at 4 cm from the nipple and one at 6
cm from the nipple. The cyst 6 cm from the nipple measures 1.5 cm
greatest diameter. The cyst approximately 4 cm the nipple measures
1.4 cm greatest diameter. No suspicious masses are seen in the outer
left breast.

Ultrasound of the lower inner quadrant of the right breast confirms
a 10 mm simple cyst 4 o'clock position 4 cm from the nipple,
corresponding to the circumscribed mass seen in the lower inner
quadrant of the right breast on recent screening mammogram.

Ultrasound of the 11 o'clock region of the right breast demonstrates
a subtle area of linear hypoechogenicity/architectural distortion,
with a small area of posterior acoustic shadowing. This is at 6 cm
from the nipple and is seen immediately medial to the area of
cutaneous scarring at 11 o'clock position right breast.

After the ultrasound was performed of the right breast, a metallic
skin marker was placed at the site of the ultrasound abnormality and
whole breast tomographic images with the BB in tangent are
performed. The finding seen on ultrasound does appear to correspond
to the same region as the mammographic abnormality, and given its
proximity to the cutaneous scar, may be due to surgical scarring.
Comparison over time it is somewhat difficult, as no 3D tomographic
images have been performed at the past.
IMPRESSION: 1. Small area of architectural distortion 11 o'clock region of the
right breast. While this could be secondary to postsurgical scarring
from prior surgery in 9118, this is not definite. Ultrasound
findings are very subtle. Biopsy using tomosynthesis for guidance is
recommended for pathological evaluation.
2. Bilateral simple cysts.

RECOMMENDATION:
Right breast biopsy using tomographic guidance is recommended and
has been scheduled for the patient's convenience.

I have discussed the findings and recommendations with the patient.
Results were also provided in writing at the conclusion of the
visit. If applicable, a reminder letter will be sent to the patient
regarding the next appointment.

BI-RADS CATEGORY  4: Suspicious.

## 2017-01-26 ENCOUNTER — Other Ambulatory Visit: Payer: Self-pay | Admitting: Hematology and Oncology

## 2017-01-26 DIAGNOSIS — C50411 Malignant neoplasm of upper-outer quadrant of right female breast: Secondary | ICD-10-CM

## 2017-02-13 ENCOUNTER — Other Ambulatory Visit: Payer: Self-pay | Admitting: *Deleted

## 2017-02-13 DIAGNOSIS — C50411 Malignant neoplasm of upper-outer quadrant of right female breast: Secondary | ICD-10-CM

## 2017-02-13 MED ORDER — ANASTROZOLE 1 MG PO TABS: ORAL_TABLET | ORAL | 3 refills | Status: DC

## 2017-02-17 ENCOUNTER — Other Ambulatory Visit: Payer: Self-pay | Admitting: Obstetrics and Gynecology

## 2017-02-17 DIAGNOSIS — Z853 Personal history of malignant neoplasm of breast: Secondary | ICD-10-CM

## 2017-03-29 ENCOUNTER — Other Ambulatory Visit: Payer: Self-pay | Admitting: Internal Medicine

## 2017-03-29 DIAGNOSIS — M5116 Intervertebral disc disorders with radiculopathy, lumbar region: Secondary | ICD-10-CM

## 2017-04-06 ENCOUNTER — Ambulatory Visit
Admission: RE | Admit: 2017-04-06 | Discharge: 2017-04-06 | Disposition: A | Discharge status: Home / Self Care | Payer: Medicare HMO | Source: Ambulatory Visit | Attending: Obstetrics and Gynecology | Admitting: Obstetrics and Gynecology

## 2017-04-06 ENCOUNTER — Ambulatory Visit: Payer: Medicare Other

## 2017-04-06 DIAGNOSIS — Z853 Personal history of malignant neoplasm of breast: Secondary | ICD-10-CM

## 2017-04-07 ENCOUNTER — Other Ambulatory Visit: Payer: Self-pay | Admitting: Internal Medicine

## 2017-04-07 DIAGNOSIS — M5116 Intervertebral disc disorders with radiculopathy, lumbar region: Secondary | ICD-10-CM

## 2017-04-13 ENCOUNTER — Ambulatory Visit
Admission: RE | Admit: 2017-04-13 | Discharge: 2017-04-13 | Disposition: A | Discharge status: Home / Self Care | Payer: Medicare HMO | Source: Ambulatory Visit | Attending: Internal Medicine | Admitting: Internal Medicine

## 2017-04-13 DIAGNOSIS — M5116 Intervertebral disc disorders with radiculopathy, lumbar region: Secondary | ICD-10-CM

## 2017-07-11 ENCOUNTER — Other Ambulatory Visit: Payer: Self-pay | Admitting: Orthopedic Surgery

## 2017-07-11 DIAGNOSIS — M1711 Unilateral primary osteoarthritis, right knee: Secondary | ICD-10-CM

## 2017-07-11 DIAGNOSIS — M2391 Unspecified internal derangement of right knee: Secondary | ICD-10-CM

## 2017-07-15 ENCOUNTER — Ambulatory Visit
Admission: RE | Admit: 2017-07-15 | Discharge: 2017-07-15 | Disposition: A | Discharge status: Home / Self Care | Payer: Medicare HMO | Source: Ambulatory Visit | Attending: Orthopedic Surgery | Admitting: Orthopedic Surgery

## 2017-07-15 DIAGNOSIS — M1711 Unilateral primary osteoarthritis, right knee: Secondary | ICD-10-CM | POA: Insufficient documentation

## 2017-07-15 DIAGNOSIS — M2391 Unspecified internal derangement of right knee: Secondary | ICD-10-CM | POA: Diagnosis present

## 2017-07-15 DIAGNOSIS — X58XXXA Exposure to other specified factors, initial encounter: Secondary | ICD-10-CM | POA: Insufficient documentation

## 2017-07-15 DIAGNOSIS — S83281A Other tear of lateral meniscus, current injury, right knee, initial encounter: Secondary | ICD-10-CM | POA: Diagnosis not present

## 2017-07-15 DIAGNOSIS — E669 Obesity, unspecified: Secondary | ICD-10-CM | POA: Diagnosis present

## 2017-07-25 DIAGNOSIS — M1711 Unilateral primary osteoarthritis, right knee: Secondary | ICD-10-CM | POA: Insufficient documentation

## 2017-09-14 ENCOUNTER — Encounter
Admission: RE | Admit: 2017-09-14 | Discharge: 2017-09-14 | Disposition: A | Discharge status: Home / Self Care | Payer: Medicare HMO | Source: Ambulatory Visit | Attending: Neurosurgery | Admitting: Neurosurgery

## 2017-09-14 DIAGNOSIS — I10 Essential (primary) hypertension: Secondary | ICD-10-CM | POA: Insufficient documentation

## 2017-09-14 DIAGNOSIS — R9431 Abnormal electrocardiogram [ECG] [EKG]: Secondary | ICD-10-CM | POA: Insufficient documentation

## 2017-09-14 DIAGNOSIS — Z01818 Encounter for other preprocedural examination: Secondary | ICD-10-CM | POA: Diagnosis not present

## 2017-09-14 DIAGNOSIS — M5416 Radiculopathy, lumbar region: Secondary | ICD-10-CM | POA: Diagnosis not present

## 2017-09-14 LAB — BASIC METABOLIC PANEL
ANION GAP: 10 (ref 5–15)
BUN: 23 mg/dL — ABNORMAL HIGH (ref 6–20)
CALCIUM: 9.4 mg/dL (ref 8.9–10.3)
CO2: 27 mmol/L (ref 22–32)
Chloride: 103 mmol/L (ref 101–111)
Creatinine, Ser: 0.72 mg/dL (ref 0.44–1.00)
GLUCOSE: 104 mg/dL — AB (ref 65–99)
POTASSIUM: 4 mmol/L (ref 3.5–5.1)
SODIUM: 140 mmol/L (ref 135–145)

## 2017-09-14 LAB — SURGICAL PCR SCREEN
MRSA, PCR: NEGATIVE
STAPHYLOCOCCUS AUREUS: NEGATIVE

## 2017-09-14 LAB — DIFFERENTIAL
BASOS PCT: 1 %
Basophils Absolute: 0 10*3/uL (ref 0–0.1)
Eosinophils Absolute: 0.1 10*3/uL (ref 0–0.7)
Eosinophils Relative: 3 %
Lymphocytes Relative: 11 %
Lymphs Abs: 0.5 10*3/uL — ABNORMAL LOW (ref 1.0–3.6)
MONOS PCT: 12 %
Monocytes Absolute: 0.6 10*3/uL (ref 0.2–0.9)
NEUTROS ABS: 3.4 10*3/uL (ref 1.4–6.5)
Neutrophils Relative %: 73 %

## 2017-09-14 LAB — TYPE AND SCREEN
ABO/RH(D): A NEG
Antibody Screen: NEGATIVE

## 2017-09-14 LAB — URINALYSIS, ROUTINE W REFLEX MICROSCOPIC
BILIRUBIN URINE: NEGATIVE
GLUCOSE, UA: NEGATIVE mg/dL
Hgb urine dipstick: NEGATIVE
KETONES UR: NEGATIVE mg/dL
Leukocytes, UA: NEGATIVE
Nitrite: NEGATIVE
PH: 5 (ref 5.0–8.0)
Protein, ur: NEGATIVE mg/dL
SPECIFIC GRAVITY, URINE: 1.023 (ref 1.005–1.030)

## 2017-09-14 LAB — CBC
HEMATOCRIT: 37 % (ref 35.0–47.0)
Hemoglobin: 12.6 g/dL (ref 12.0–16.0)
MCH: 31.4 pg (ref 26.0–34.0)
MCHC: 34 g/dL (ref 32.0–36.0)
MCV: 92.3 fL (ref 80.0–100.0)
Platelets: 250 10*3/uL (ref 150–440)
RBC: 4.01 MIL/uL (ref 3.80–5.20)
RDW: 14.5 % (ref 11.5–14.5)
WBC: 4.6 10*3/uL (ref 3.6–11.0)

## 2017-09-14 LAB — PROTIME-INR
INR: 1.03
PROTHROMBIN TIME: 13.4 s (ref 11.4–15.2)

## 2017-09-14 LAB — APTT: aPTT: 35 seconds (ref 24–36)

## 2017-09-14 NOTE — Patient Instructions (Signed)
Your procedure is scheduled on: Wed. 09/20/17 Report to Day Surgery. To find out your arrival time please call [phone] between 1PM - 3PM on Tues 09/19/17.  Remember: Instructions that are not followed completely may result in serious medical risk, up to and including death, or upon the discretion of your surgeon and anesthesiologist your surgery may need to be rescheduled.     _X__ 1. Do not eat food after midnight the night before your procedure.                 No gum chewing or hard candies. You may drink clear liquids up to 2 hours                 before you are scheduled to arrive for your surgery- DO not drink clear                 liquids within 2 hours of the start of your surgery.                 Clear Liquids include:  water, apple juice without pulp, clear carbohydrate                 drink such as Clearfast of Gartorade, Black Coffee or Tea (Do not add                 anything to coffee or tea).     _X__ 2.  No Alcohol for 24 hours before or after surgery.   ___ 3.  Do Not Smoke or use e-cigarettes For 24 Hours Prior to Your Surgery.                 Do not use any chewable tobacco products for at least 6 hours prior to                 surgery.  ____  4.  Bring all medications with you on the day of surgery if instructed.   __x__  5.  Notify your doctor if there is any change in your medical condition      (cold, fever, infections).     Do not wear jewelry, make-up, hairpins, clips or nail polish. Do not wear lotions, powders, or perfumes. You may wear deodorant. Do not shave 48 hours prior to surgery. Men may shave face and neck. Do not bring valuables to the hospital.    [facility] is not responsible for any belongings or valuables.  Contacts, dentures or bridgework may not be worn into surgery. Leave your suitcase in the car. After surgery it may be brought to your room. For patients admitted to the hospital, discharge time is determined  by your treatment team.   Patients discharged the day of surgery will not be allowed to drive home.   Please read over the following fact sheets that you were given:   MRSA Information          __x__ Take these medicines the morning of surgery with A SIP OF WATER:    1. acetaminophen (TYLENOL) 500 MG tablet  2. traMADol (ULTRAM) 50 MG tablet if needed  3. levothyroxine (SYNTHROID, LEVOTHROID) 175 MCG tablet  4.ALPRAZolam (XANAX) 0.5 MG tablet  5.  6.  ____ Fleet Enema (as directed)   __x__ Use CHG Soap as directed  ____ Use inhalers on the day of surgery  ____ Stop metformin 2 days prior to surgery    ____ Take 1/2 of usual insulin dose the  night before surgery. No insulin the morning          of surgery.   __x__ Stop aspirin on today  __x__ Stop Anti-inflammatories on meloxicam (MOBIC) 15 MG tablet today ibuprofen,Aleve (Tylenol Ok to use)   __x__ Stop supplements until after surgery.  Ascorbic Acid (VITAMIN C) 1000 MG tablet  ____ Bring C-Pap to the hospital.

## 2017-09-20 ENCOUNTER — Ambulatory Visit: Payer: Medicare HMO | Admitting: Anesthesiology

## 2017-09-20 ENCOUNTER — Encounter: Payer: Self-pay | Admitting: *Deleted

## 2017-09-20 ENCOUNTER — Ambulatory Visit: Payer: Medicare HMO

## 2017-09-20 ENCOUNTER — Ambulatory Visit
Admission: RE | Admit: 2017-09-20 | Discharge: 2017-09-20 | Disposition: A | Discharge status: Home / Self Care | Payer: Medicare HMO | Source: Ambulatory Visit | Attending: Neurosurgery | Admitting: Neurosurgery

## 2017-09-20 ENCOUNTER — Encounter
Admission: RE | Disposition: A | Discharge status: Home / Self Care | Payer: Self-pay | Source: Ambulatory Visit | Attending: Neurosurgery

## 2017-09-20 DIAGNOSIS — Z7982 Long term (current) use of aspirin: Secondary | ICD-10-CM | POA: Diagnosis not present

## 2017-09-20 DIAGNOSIS — E039 Hypothyroidism, unspecified: Secondary | ICD-10-CM | POA: Diagnosis not present

## 2017-09-20 DIAGNOSIS — Z803 Family history of malignant neoplasm of breast: Secondary | ICD-10-CM | POA: Diagnosis not present

## 2017-09-20 DIAGNOSIS — Z91013 Allergy to seafood: Secondary | ICD-10-CM | POA: Diagnosis not present

## 2017-09-20 DIAGNOSIS — Z90710 Acquired absence of both cervix and uterus: Secondary | ICD-10-CM | POA: Diagnosis not present

## 2017-09-20 DIAGNOSIS — I10 Essential (primary) hypertension: Secondary | ICD-10-CM | POA: Diagnosis not present

## 2017-09-20 DIAGNOSIS — Z8249 Family history of ischemic heart disease and other diseases of the circulatory system: Secondary | ICD-10-CM | POA: Diagnosis not present

## 2017-09-20 DIAGNOSIS — Z419 Encounter for procedure for purposes other than remedying health state, unspecified: Secondary | ICD-10-CM

## 2017-09-20 DIAGNOSIS — Z881 Allergy status to other antibiotic agents status: Secondary | ICD-10-CM | POA: Insufficient documentation

## 2017-09-20 DIAGNOSIS — Z8042 Family history of malignant neoplasm of prostate: Secondary | ICD-10-CM | POA: Insufficient documentation

## 2017-09-20 DIAGNOSIS — M48062 Spinal stenosis, lumbar region with neurogenic claudication: Secondary | ICD-10-CM | POA: Diagnosis not present

## 2017-09-20 DIAGNOSIS — Z885 Allergy status to narcotic agent status: Secondary | ICD-10-CM | POA: Insufficient documentation

## 2017-09-20 DIAGNOSIS — Z888 Allergy status to other drugs, medicaments and biological substances status: Secondary | ICD-10-CM | POA: Diagnosis not present

## 2017-09-20 DIAGNOSIS — Z825 Family history of asthma and other chronic lower respiratory diseases: Secondary | ICD-10-CM | POA: Diagnosis not present

## 2017-09-20 DIAGNOSIS — M5416 Radiculopathy, lumbar region: Secondary | ICD-10-CM | POA: Insufficient documentation

## 2017-09-20 DIAGNOSIS — M1990 Unspecified osteoarthritis, unspecified site: Secondary | ICD-10-CM | POA: Diagnosis not present

## 2017-09-20 DIAGNOSIS — Z79899 Other long term (current) drug therapy: Secondary | ICD-10-CM | POA: Insufficient documentation

## 2017-09-20 HISTORY — PX: LUMBAR LAMINECTOMY/DECOMPRESSION MICRODISCECTOMY: SHX5026

## 2017-09-20 LAB — ABO/RH: ABO/RH(D): A NEG

## 2017-09-20 SURGERY — LUMBAR LAMINECTOMY/DECOMPRESSION MICRODISCECTOMY 1 LEVEL
Anesthesia: General

## 2017-09-20 MED ORDER — FENTANYL CITRATE (PF) 100 MCG/2ML IJ SOLN: 25.0000 ug | INTRAMUSCULAR | Status: DC | PRN

## 2017-09-20 MED ORDER — THROMBIN 5000 UNITS EX SOLR
CUTANEOUS | Status: DC | PRN
  Administered 2017-09-20: 5000 [IU] via TOPICAL

## 2017-09-20 MED ORDER — OXYCODONE HCL 5 MG PO TABS
5.0000 mg | ORAL_TABLET | Freq: Once | ORAL | Status: AC | PRN
  Administered 2017-09-20: 5 mg via ORAL

## 2017-09-20 MED ORDER — BUPIVACAINE HCL 0.5 % IJ SOLN
INTRAMUSCULAR | Status: DC | PRN
  Administered 2017-09-20: 20 mL

## 2017-09-20 MED ORDER — BUPIVACAINE LIPOSOME 1.3 % IJ SUSP
INTRAMUSCULAR | Status: DC | PRN
  Administered 2017-09-20: 20 mL

## 2017-09-20 MED ORDER — ROCURONIUM BROMIDE 50 MG/5ML IV SOLN
INTRAVENOUS | Status: AC
  Filled 2017-09-20: qty 1

## 2017-09-20 MED ORDER — THROMBIN 5000 UNITS EX SOLR
CUTANEOUS | Status: AC
  Filled 2017-09-20: qty 10000

## 2017-09-20 MED ORDER — EVICEL 2 ML EX KIT
PACK | CUTANEOUS | Status: AC
  Filled 2017-09-20: qty 1

## 2017-09-20 MED ORDER — BUPIVACAINE LIPOSOME 1.3 % IJ SUSP
INTRAMUSCULAR | Status: AC
  Filled 2017-09-20: qty 20

## 2017-09-20 MED ORDER — GELATIN ABSORBABLE 12-7 MM EX MISC
CUTANEOUS | Status: DC | PRN
  Administered 2017-09-20: 1 via TOPICAL

## 2017-09-20 MED ORDER — PROPOFOL 10 MG/ML IV BOLUS: INTRAVENOUS | Status: DC | PRN

## 2017-09-20 MED ORDER — PROPOFOL 10 MG/ML IV BOLUS
INTRAVENOUS | Status: DC | PRN
  Administered 2017-09-20: 30 mg via INTRAVENOUS
  Administered 2017-09-20: 140 mg via INTRAVENOUS

## 2017-09-20 MED ORDER — ONDANSETRON HCL 4 MG/2ML IJ SOLN
INTRAMUSCULAR | Status: AC
  Filled 2017-09-20: qty 2

## 2017-09-20 MED ORDER — LACTATED RINGERS IV SOLN
INTRAVENOUS | Status: DC
  Administered 2017-09-20: 07:00:00 via INTRAVENOUS

## 2017-09-20 MED ORDER — FAMOTIDINE 20 MG PO TABS
ORAL_TABLET | ORAL | Status: AC
  Administered 2017-09-20: 20 mg via ORAL
  Filled 2017-09-20: qty 1

## 2017-09-20 MED ORDER — SUGAMMADEX SODIUM 500 MG/5ML IV SOLN
INTRAVENOUS | Status: DC | PRN
  Administered 2017-09-20: 200 mg via INTRAVENOUS

## 2017-09-20 MED ORDER — BUPIVACAINE-EPINEPHRINE (PF) 0.5% -1:200000 IJ SOLN
INTRAMUSCULAR | Status: AC
  Filled 2017-09-20: qty 30

## 2017-09-20 MED ORDER — DEXAMETHASONE SODIUM PHOSPHATE 10 MG/ML IJ SOLN
INTRAMUSCULAR | Status: AC
  Filled 2017-09-20: qty 1

## 2017-09-20 MED ORDER — SUCCINYLCHOLINE CHLORIDE 20 MG/ML IJ SOLN
INTRAMUSCULAR | Status: AC
  Filled 2017-09-20: qty 1

## 2017-09-20 MED ORDER — PROPOFOL 10 MG/ML IV BOLUS
INTRAVENOUS | Status: AC
  Filled 2017-09-20: qty 20

## 2017-09-20 MED ORDER — OXYCODONE HCL 5 MG PO TABS
ORAL_TABLET | ORAL | Status: AC
  Filled 2017-09-20: qty 1

## 2017-09-20 MED ORDER — SODIUM CHLORIDE FLUSH 0.9 % IV SOLN
INTRAVENOUS | Status: AC
  Filled 2017-09-20: qty 20

## 2017-09-20 MED ORDER — BACITRACIN 50000 UNITS IM SOLR
INTRAMUSCULAR | Status: DC | PRN
  Administered 2017-09-20: 50000 [IU]

## 2017-09-20 MED ORDER — EVICEL 2 ML EX KIT
PACK | CUTANEOUS | Status: DC | PRN
  Administered 2017-09-20: 2 mL via TOPICAL

## 2017-09-20 MED ORDER — SODIUM CHLORIDE 0.9 % IJ SOLN
INTRAMUSCULAR | Status: AC
  Filled 2017-09-20: qty 20

## 2017-09-20 MED ORDER — SODIUM CHLORIDE 0.9 % IV SOLN
INTRAVENOUS | Status: DC | PRN
  Administered 2017-09-20: 25 ug/min via INTRAVENOUS

## 2017-09-20 MED ORDER — FENTANYL CITRATE (PF) 100 MCG/2ML IJ SOLN
INTRAMUSCULAR | Status: AC
  Filled 2017-09-20: qty 2

## 2017-09-20 MED ORDER — SUGAMMADEX SODIUM 500 MG/5ML IV SOLN
INTRAVENOUS | Status: AC
  Filled 2017-09-20: qty 5

## 2017-09-20 MED ORDER — SODIUM CHLORIDE 0.9 % IJ SOLN
INTRAMUSCULAR | Status: DC | PRN
  Administered 2017-09-20: 20 mL

## 2017-09-20 MED ORDER — VANCOMYCIN HCL 10 G IV SOLR
1250.0000 mg | Freq: Once | INTRAVENOUS | Status: AC
  Administered 2017-09-20: 1250 mg via INTRAVENOUS
  Filled 2017-09-20: qty 1250

## 2017-09-20 MED ORDER — EPHEDRINE SULFATE 50 MG/ML IJ SOLN
INTRAMUSCULAR | Status: AC
  Filled 2017-09-20: qty 1

## 2017-09-20 MED ORDER — GELATIN ABSORBABLE 12-7 MM EX MISC
CUTANEOUS | Status: AC
  Filled 2017-09-20: qty 1

## 2017-09-20 MED ORDER — GLYCOPYRROLATE 0.2 MG/ML IJ SOLN
INTRAMUSCULAR | Status: AC
  Filled 2017-09-20: qty 1

## 2017-09-20 MED ORDER — FENTANYL CITRATE (PF) 100 MCG/2ML IJ SOLN
INTRAMUSCULAR | Status: DC | PRN
  Administered 2017-09-20 (×2): 50 ug via INTRAVENOUS

## 2017-09-20 MED ORDER — LIDOCAINE HCL (CARDIAC) 20 MG/ML IV SOLN
INTRAVENOUS | Status: DC | PRN
  Administered 2017-09-20: 80 mg via INTRAVENOUS

## 2017-09-20 MED ORDER — PHENYLEPHRINE HCL 10 MG/ML IJ SOLN
INTRAMUSCULAR | Status: DC | PRN
  Administered 2017-09-20: 50 ug via INTRAVENOUS
  Administered 2017-09-20 (×2): 100 ug via INTRAVENOUS

## 2017-09-20 MED ORDER — METHOCARBAMOL 500 MG PO TABS: 500.0000 mg | ORAL_TABLET | Freq: Four times a day (QID) | ORAL | 0 refills | Status: DC | PRN

## 2017-09-20 MED ORDER — BUPIVACAINE HCL (PF) 0.5 % IJ SOLN
INTRAMUSCULAR | Status: AC
  Filled 2017-09-20: qty 30

## 2017-09-20 MED ORDER — METHYLPREDNISOLONE ACETATE 40 MG/ML IJ SUSP
INTRAMUSCULAR | Status: AC
Start: 2017-09-20 — End: 2017-09-20
  Filled 2017-09-20: qty 1

## 2017-09-20 MED ORDER — BUPIVACAINE-EPINEPHRINE (PF) 0.5% -1:200000 IJ SOLN
INTRAMUSCULAR | Status: DC | PRN
  Administered 2017-09-20: 10 mL

## 2017-09-20 MED ORDER — ROCURONIUM BROMIDE 100 MG/10ML IV SOLN
INTRAVENOUS | Status: DC | PRN
  Administered 2017-09-20: 10 mg via INTRAVENOUS
  Administered 2017-09-20: 5 mg via INTRAVENOUS
  Administered 2017-09-20: 50 mg via INTRAVENOUS

## 2017-09-20 MED ORDER — OXYCODONE HCL 5 MG PO TABS: 5.0000 mg | ORAL_TABLET | ORAL | 0 refills | Status: DC | PRN

## 2017-09-20 MED ORDER — ONDANSETRON HCL 4 MG/2ML IJ SOLN
INTRAMUSCULAR | Status: DC | PRN
  Administered 2017-09-20: 4 mg via INTRAVENOUS

## 2017-09-20 MED ORDER — FAMOTIDINE 20 MG PO TABS
20.0000 mg | ORAL_TABLET | Freq: Once | ORAL | Status: AC
  Administered 2017-09-20: 20 mg via ORAL

## 2017-09-20 MED ORDER — DEXAMETHASONE SODIUM PHOSPHATE 10 MG/ML IJ SOLN
INTRAMUSCULAR | Status: DC | PRN
  Administered 2017-09-20: 10 mg via INTRAVENOUS

## 2017-09-20 MED ORDER — OXYCODONE HCL 5 MG/5ML PO SOLN: 5.0000 mg | Freq: Once | ORAL | Status: AC | PRN

## 2017-09-20 MED ORDER — SUGAMMADEX SODIUM 200 MG/2ML IV SOLN
INTRAVENOUS | Status: AC
  Filled 2017-09-20: qty 2

## 2017-09-20 MED ORDER — BACITRACIN 50000 UNITS IM SOLR
INTRAMUSCULAR | Status: AC
  Filled 2017-09-20: qty 1

## 2017-09-20 MED ORDER — PHENYLEPHRINE HCL 10 MG/ML IJ SOLN
INTRAMUSCULAR | Status: AC
  Filled 2017-09-20: qty 1

## 2017-09-20 SURGICAL SUPPLY — 71 items
ADH SKN CLS APL DERMABOND .7 (GAUZE/BANDAGES/DRESSINGS) ×1
AGENT HMST MTR 8 SURGIFLO (HEMOSTASIS) ×1
APL SRG 60D 8 XTD TIP BNDBL (TIP)
BLADE BOVIE TIP EXT 4 (BLADE) ×3 IMPLANT
BUR NEURO DRILL SOFT 3.0X3.8M (BURR) ×3 IMPLANT
CANISTER SUCT 1200ML W/VALVE (MISCELLANEOUS) ×6 IMPLANT
CHLORAPREP W/TINT 26ML (MISCELLANEOUS) ×3 IMPLANT
CNTNR SPEC 2.5X3XGRAD LEK (MISCELLANEOUS) ×1
CONT SPEC 4OZ STER OR WHT (MISCELLANEOUS) ×2
CONT SPEC 4OZ STRL OR WHT (MISCELLANEOUS) ×1
CONTAINER SPEC 2.5X3XGRAD LEK (MISCELLANEOUS) ×1 IMPLANT
COUNTER NEEDLE 20/40 LG (NEEDLE) ×3 IMPLANT
COVER LIGHT HANDLE STERIS (MISCELLANEOUS) ×6 IMPLANT
CUP MEDICINE 2OZ PLAST GRAD ST (MISCELLANEOUS) ×6 IMPLANT
DERMABOND ADVANCED (GAUZE/BANDAGES/DRESSINGS) ×2
DERMABOND ADVANCED .7 DNX12 (GAUZE/BANDAGES/DRESSINGS) ×1 IMPLANT
DRAPE C-ARM 42X72 X-RAY (DRAPES) ×6 IMPLANT
DRAPE LAPAROTOMY 100X77 ABD (DRAPES) ×3 IMPLANT
DRAPE MICROSCOPE SPINE 48X150 (DRAPES) ×3 IMPLANT
DRAPE POUCH INSTRU U-SHP 10X18 (DRAPES) ×3 IMPLANT
DRAPE SURG 17X11 SM STRL (DRAPES) ×12 IMPLANT
DRSG TEGADERM 4X4.75 (GAUZE/BANDAGES/DRESSINGS) IMPLANT
DRSG TELFA 4X3 1S NADH ST (GAUZE/BANDAGES/DRESSINGS) IMPLANT
DURASEAL APPLICATOR TIP (TIP) IMPLANT
DURASEAL SPINE SEALANT 3ML (MISCELLANEOUS) IMPLANT
ELECT CAUTERY BLADE TIP 2.5 (TIP) ×3
ELECT EZSTD 165MM 6.5IN (MISCELLANEOUS)
ELECT REM PT RETURN 9FT ADLT (ELECTROSURGICAL) ×3
ELECTRODE CAUTERY BLDE TIP 2.5 (TIP) ×1 IMPLANT
ELECTRODE EZSTD 165MM 6.5IN (MISCELLANEOUS) IMPLANT
ELECTRODE REM PT RTRN 9FT ADLT (ELECTROSURGICAL) ×1 IMPLANT
FRAME EYE SHIELD (PROTECTIVE WEAR) ×3 IMPLANT
GLOVE BIO SURGEON STRL SZ 6.5 (GLOVE) ×4 IMPLANT
GLOVE BIO SURGEONS STRL SZ 6.5 (GLOVE) ×2
GLOVE BIOGEL PI IND STRL 7.0 (GLOVE) ×2 IMPLANT
GLOVE BIOGEL PI INDICATOR 7.0 (GLOVE) ×4
GLOVE SURG SYN 8.5  E (GLOVE) ×6
GLOVE SURG SYN 8.5 E (GLOVE) ×3 IMPLANT
GOWN SRG XL LVL 3 NONREINFORCE (GOWNS) ×1 IMPLANT
GOWN STRL NON-REIN TWL XL LVL3 (GOWNS) ×3
GOWN STRL REUS W/ TWL LRG LVL3 (GOWN DISPOSABLE) ×1 IMPLANT
GOWN STRL REUS W/TWL LRG LVL3 (GOWN DISPOSABLE) ×3
GRADUATE 1200CC STRL 31836 (MISCELLANEOUS) ×3 IMPLANT
GRAFT DURAGEN MATRIX 1WX1L (Tissue) ×3 IMPLANT
KIT SPINAL PRONEVIEW (KITS) ×3 IMPLANT
KNIFE BAYONET SHORT DISCETOMY (MISCELLANEOUS) IMPLANT
MARKER SKIN DUAL TIP RULER LAB (MISCELLANEOUS) ×6 IMPLANT
NDL SAFETY ECLIPSE 18X1.5 (NEEDLE) ×1 IMPLANT
NEEDLE HYPO 18GX1.5 SHARP (NEEDLE) ×3
NEEDLE HYPO 22GX1.5 SAFETY (NEEDLE) ×3 IMPLANT
NS IRRIG 1000ML POUR BTL (IV SOLUTION) ×3 IMPLANT
PACK LAMINECTOMY NEURO (CUSTOM PROCEDURE TRAY) ×3 IMPLANT
PAD ARMBOARD 7.5X6 YLW CONV (MISCELLANEOUS) ×3 IMPLANT
PATTIES SURGICAL .5X1.5 (GAUZE/BANDAGES/DRESSINGS) IMPLANT
SPOGE SURGIFLO 8M (HEMOSTASIS) ×2
SPONGE SURGIFLO 8M (HEMOSTASIS) ×1 IMPLANT
STAPLER SKIN PROX 35W (STAPLE) IMPLANT
SUT DVC VLOC 3-0 CL 6 P-12 (SUTURE) ×3 IMPLANT
SUT NURALON 4 0 TR CR/8 (SUTURE) IMPLANT
SUT VIC AB 0 CT1 27 (SUTURE) ×3
SUT VIC AB 0 CT1 27XCR 8 STRN (SUTURE) ×1 IMPLANT
SUT VIC AB 2-0 CT1 18 (SUTURE) ×3 IMPLANT
SYR 20CC LL (SYRINGE) ×3 IMPLANT
SYR 30ML LL (SYRINGE) ×6 IMPLANT
SYR 3ML LL SCALE MARK (SYRINGE) ×3 IMPLANT
SYRINGE 10CC LL (SYRINGE) ×3 IMPLANT
TOWEL OR 17X26 4PK STRL BLUE (TOWEL DISPOSABLE) ×12 IMPLANT
TUBE MATRX SPINL 18MM 7CM DISP (INSTRUMENTS) ×3
TUBE METRX SPINAL 18X7 DISP (INSTRUMENTS) ×1 IMPLANT
TUBING CONNECTING 10 (TUBING) ×2 IMPLANT
TUBING CONNECTING 10' (TUBING) ×1

## 2017-09-20 NOTE — Anesthesia Procedure Notes (Signed)
Procedure Name: Intubation Date/Time: 09/20/2017 7:21 AM Performed by: Darlyne Russian Pre-anesthesia Checklist: Patient identified, Emergency Drugs available, Suction available, Patient being monitored and Timeout performed Patient Re-evaluated:Patient Re-evaluated prior to induction Oxygen Delivery Method: Circle system utilized Preoxygenation: Pre-oxygenation with 100% oxygen Induction Type: IV induction Ventilation: Mask ventilation without difficulty Grade View: Grade III Tube type: Oral Tube size: 7.0 mm Number of attempts: 1 Airway Equipment and Method: Stylet Placement Confirmation: ETT inserted through vocal cords under direct vision,  positive ETCO2 and breath sounds checked- equal and bilateral Secured at: 24 cm Tube secured with: Tape Dental Injury: Teeth and Oropharynx as per pre-operative assessment

## 2017-09-20 NOTE — Anesthesia Preprocedure Evaluation (Signed)
Anesthesia Evaluation  Patient identified by MRN, date of birth, ID band Patient awake    Reviewed: Allergy & Precautions, H&P , NPO status , Patient's Chart, lab work & pertinent test results  History of Anesthesia Complications Negative for: history of anesthetic complications  Airway Mallampati: III  TM Distance: <3 FB Neck ROM: limited    Dental  (+) Poor Dentition, Chipped, Missing, Partial Upper   Pulmonary neg pulmonary ROS, neg shortness of breath,           Cardiovascular Exercise Tolerance: Good hypertension, (-) angina(-) Past MI and (-) DOE      Neuro/Psych negative neurological ROS  negative psych ROS   GI/Hepatic negative GI ROS, Neg liver ROS, neg GERD  ,  Endo/Other  Hypothyroidism   Renal/GU      Musculoskeletal  (+) Arthritis ,   Abdominal   Peds  Hematology negative hematology ROS (+)   Anesthesia Other Findings Past Medical History: No date: Allergy 09-13-12: Arthritis     Comment:  arthritis- knees, right shoulder, finger 04/16/15: Breast cancer ([place])     Comment:  right breastinvasive and in situ mammary  04/20/2015: Breast cancer of upper-outer quadrant of right female  breast ([place]) No date: Hypertension 09-13-12: Hypothyroidism     Comment:  tx. supplement 07/01/15-08/04/15: Radiation     Comment:  right breast  Past Surgical History: 09-13-12: ABDOMINAL HYSTERECTOMY     Comment:  '88 09-13-12: APPENDECTOMY     Comment:  '76 04/16/2015: BREAST BIOPSY; Right     Comment:  Stereo- Malignant 02/18/2014: BREAST BIOPSY; Left     Comment:  U/S Core- Benign 1993: BREAST EXCISIONAL BIOPSY; Right     Comment:  Benign 05/08/2015: BREAST LUMPECTOMY; Right 05/08/2015: BREAST LUMPECTOMY WITH RADIOACTIVE SEED AND SENTINEL LYMPH  NODE BIOPSY; Right     Comment:  Procedure: RIGHT BREAST LUMPECTOMY WITH RADIOACTIVE SEED              AND RIGHT AXILLARY SENTINEL LYMPH NODE BIOPSY;  Surgeon:      Alphonsa Overall, MD;  Location: [facility];                Service: General;  Laterality: Right; 09-13-12: BREAST SURGERY     Comment:  '86-rt. breast lumpectomy-benign 09/18/2012: CYSTOCELE REPAIR     Comment:  Procedure: ANTERIOR REPAIR (CYSTOCELE);  Surgeon: Reece Packer, MD;  Location: [facility];  Service: Urology;                Laterality: N/A;  Cystocele/Vault Repair/Cystoscopy and               Graft 09/18/2012: CYSTOSCOPY     Comment:  Procedure: CYSTOSCOPY;  Surgeon: Reece Packer, MD;              Location: [facility];  Service: Urology;  Laterality: N/A; 09-13-12: KNEE ARTHROSCOPY     Comment:  '04-lt. knee/ '10,'11-right knee scopes 10/15/2013: RECTOCELE REPAIR; N/A     Comment:  Procedure: [place];  Surgeon: Reece Packer, MD;  Location:               [facility];  Service: Urology;  Laterality: N/A; 09-13-12: TUBAL LIGATION     Comment:  '76 09/18/2012: VAGINAL PROLAPSE REPAIR     Comment:  Procedure: VAGINAL VAULT SUSPENSION;  Surgeon: Reece Packer, MD;  Location: [facility];  Service: Urology;                Laterality: N/A;  BMI    Body Mass Index:  33.29 kg/m      Reproductive/Obstetrics negative OB ROS                             Anesthesia Physical Anesthesia Plan  ASA: III  Anesthesia Plan: General ETT   Post-op Pain Management:    Induction: Intravenous  PONV Risk Score and Plan: 3 and Ondansetron, Dexamethasone and Treatment may vary due to age or medical condition  Airway Management Planned: Oral ETT  Additional Equipment:   Intra-op Plan:   Post-operative Plan: Extubation in OR  Informed Consent: I have reviewed the patients History and Physical, chart, labs and discussed the procedure including the risks, benefits and alternatives for the proposed anesthesia with the patient or authorized representative who has  indicated his/her understanding and acceptance.   Dental Advisory Given  Plan Discussed with: Anesthesiologist, CRNA and Surgeon  Anesthesia Plan Comments: (Patient consented for risks of anesthesia including but not limited to:  - adverse reactions to medications - damage to teeth, lips or other oral mucosa - sore throat or hoarseness - Damage to heart, brain, lungs or loss of life  Patient voiced understanding.)        Anesthesia Quick Evaluation

## 2017-09-20 NOTE — Anesthesia Postprocedure Evaluation (Signed)
Anesthesia Post Note  Patient: Debra Kelly  Procedure(s) Performed: LUMBAR LAMINECTOMY/DECOMPRESSION MICRODISCECTOMY 1 LEVEL-L5-S1 (N/A )  Patient location during evaluation: PACU Anesthesia Type: General Level of consciousness: awake and alert Pain management: pain level controlled Vital Signs Assessment: post-procedure vital signs reviewed and stable Respiratory status: spontaneous breathing, nonlabored ventilation, respiratory function stable and patient connected to nasal cannula oxygen Cardiovascular status: blood pressure returned to baseline and stable Postop Assessment: no apparent nausea or vomiting Anesthetic complications: no     Last Vitals:  Vitals:   09/20/17 1041 09/20/17 1058  BP: (!) 145/70 (!) 149/58  Pulse: 85 78  Resp: 16 16  Temp: 36.6 C   SpO2: 100% 99%    Last Pain:  Vitals:   09/20/17 1117  TempSrc:   PainSc: 2                  Precious Haws Jonnell Hentges

## 2017-09-20 NOTE — Discharge Summary (Signed)
  History: Debra Kelly is POD0 s/p L5/S1 decompression. Patient tolerated procedure well. Small CSF leak noted during procedure, otherwise no complications.  Symptoms prior to surgery mainly occurred while standing so she is unable to determine if there has been any resolution with surgery. Denies back pain or low extremity pain.   Physical Exam: Vitals:   09/20/17 0622  BP: (!) 168/80  Pulse: 93  Resp: 20  Temp: 97.8 F (36.6 C)  SpO2: 98%    AA Ox3 CNI Skin: incision site dressed  Strength:5/5 throughout, sensation intact upper and lower extremities.   Data:   Recent Labs Lab 09/14/17 1426  NA 140  K 4.0  CL 103  CO2 27  BUN 23*  CREATININE 0.72  GLUCOSE 104*  CALCIUM 9.4   No results for input(s): AST, ALT, ALKPHOS in the last 168 hours.  Invalid input(s): TBILI    Recent Labs Lab 09/14/17 1426  WBC 4.6  HGB 12.6  HCT 37.0  PLT 250    Recent Labs Lab 09/14/17 1426  APTT 35  INR 1.03         Assessment/Plan:  Debra Kelly is POD0 s/p L5/S1 lumbar decompression. Procedure was tolerated well and symptoms prior to surgery will need to be evaluated after standing. Patient has no complaints at this time. Robaxin and oxycodone will be continued for pain control. Patient advised of post op instructions and to call clinic if any questions arise. Patient to follow up in 2 weeks at clinic to monitor progress.   Marin Olp PA-C Department of Neurosurgery

## 2017-09-20 NOTE — Transfer of Care (Signed)
Immediate Anesthesia Transfer of Care Note  Patient: Debra Kelly  Procedure(s) Performed: LUMBAR LAMINECTOMY/DECOMPRESSION MICRODISCECTOMY 1 LEVEL-L5-S1 (N/A )  Patient Location: PACU  Anesthesia Type:General  Level of Consciousness: awake, alert , oriented and patient cooperative  Airway & Oxygen Therapy: Patient Spontanous Breathing and Patient connected to face mask oxygen  Post-op Assessment: Report given to RN, Post -op Vital signs reviewed and stable and Patient moving all extremities X 4  Post vital signs: Reviewed and stable  Last Vitals:  Vitals:   09/20/17 0622 09/20/17 0945  BP: (!) 168/80 93/71  Pulse: 93 90  Resp: 20 15  Temp: 36.6 C (!) 36.1 C  SpO2: 98% 100%    Last Pain:  Vitals:   09/20/17 0945  TempSrc: Temporal  PainSc:       Patients Stated Pain Goal: 0 (01/11/42 8887)  Complications: No apparent anesthesia complications

## 2017-09-20 NOTE — Progress Notes (Signed)
Pharmacy Antibiotic Note  Debra Kelly is a 76 y.o. female admitted on 09/20/2017 with surgical prophylaxis.  Pharmacy has been consulted for vancomycin dosing.  Plan: Will give vanc 1.25g IV x 1 (15 mg/kg) for surgical prophylaxis  Height: 5\' 2"  (157.5 cm) Weight: 182 lb (82.6 kg) IBW/kg (Calculated) : 50.1  Temp (24hrs), Avg:97.8 F (36.6 C), Min:97.8 F (36.6 C), Max:97.8 F (36.6 C)   Recent Labs Lab 09/14/17 1426  WBC 4.6  CREATININE 0.72    Estimated Creatinine Clearance: 60.5 mL/min (by C-G formula based on SCr of 0.72 mg/dL).    Allergies  Allergen Reactions  . Shellfish Allergy Shortness Of Breath  . Radiaplexrx [Skin Protectants, Misc.] Other (See Comments)    Contraindicated with allergy of barbiturates causing rash  . Adhesive [Tape] Rash  . Barbiturates Rash  . Celebrex [Celecoxib] Other (See Comments)    "made me feel funny"  . Claritin [Loratadine] Other (See Comments)    Feels weird  . Codeine Rash    Thank you for allowing pharmacy to be a part of this patient's care.  Tobie Lords, PharmD, BCPS Clinical Pharmacist 09/20/2017

## 2017-09-20 NOTE — Op Note (Signed)
Indications: Debra Kelly is a 76 yo female who presented with lumbar stenosis causing left lumbar radiculopathy.  She failed conservative management and asked that we proceed with surgery  Findings: severe lumbar stenosis  Preoperative Diagnosis: Lumbar Stenosis with neurogenic claudication Postoperative Diagnosis: same   EBL: 25 ml IVF: 500 ml Drains: none Disposition: Extubated and Stable to PACU Complications: none  No foley catheter was placed.   Preoperative Note:   Risks of surgery discussed include: infection, bleeding, stroke, coma, death, paralysis, CSF leak, nerve/spinal cord injury, numbness, tingling, weakness, complex regional pain syndrome, recurrent stenosis and/or disc herniation, vascular injury, development of instability, neck/back pain, need for further surgery, persistent symptoms, development of deformity, and the risks of anesthesia. They understood these risks and have agreed to proceed.  Operative Note:   1. L5-S1 (based on imaging numbering) lumbar decompression including central laminectomy and bilateral medial facetectomies including foraminotomies  The patient was then brought from the preoperative center with intravenous access established.  The patient underwent general anesthesia and endotracheal tube intubation, and was then rotated on the [place] rail top where all pressure points were appropriately padded.  The skin was then thoroughly cleansed.  Perioperative antibiotic prophylaxis was administered.  Sterile prep and drapes were then applied and a timeout was then observed.  C-arm was brought into the field under sterile conditions and under lateral visualization the L5-S1 interspace was identified and marked.  The incision was marked on the left and injected with local anesthetic. Once this was complete a 2 cm incision was opened with the use of a #10 blade knife.  The metrx tubes were sequentially advanced and confirmed in position. An 52mm by 11mm tube  was locked in place to the bed side attachment.  Fluoroscopy was then removed from the field.  The microscope was then sterilely brought into the field and muscle creep was hemostased with a bipolar and resected with a pituitary rongeur.  A Bovie extender was then used to expose the spinous process and lamina.  Careful attention was placed to not violate the facet capsule. A 3 mm matchstick drill bit was then used to make a hemi-laminotomy trough until the ligamentum flavum was exposed.  This was extended to the base of the spinous process and to the contralateral side to remove all the central bone from each side.  Once this was complete and the underlying ligamentum flavum was visualized, it was dissected with a curette and resected with Kerrison rongeurs.  Extensive ligamentum hypertrophy was noted, requiring a substantial amount of time and care for removal.  The dura was identified and palpated. The kerrison rongeur was then used to remove the medial facet bilaterally until no compression was noted.  A balltip probe was used to confirm decompression of the left S1 nerve root.  Additional attention was paid to completion of the contralateral L5-S1 foraminotomy until the right S1 nerve root was completely free.  Once this was complete, L5-S1 central decompression including medial facetectomy and foraminotomy was confirmed and decompression on both sides was confirmed. A small dural defect was noted, and was augmented with duragen and tisseel until no active CSF leak was noted.  A Depo-Medrol soaked Gelfoam pledget was placed in the defect.  The wound was copiously irrigated. The tube system was then removed under microscopic visualization and hemostasis was obtained with a bipolar.  The fascial layer was reapproximated with the use of a 0 Vicryl suture.  Subcutaneous tissue layer was reapproximated using 2-0 Vicryl suture.  3-0 monocryl was placed in subcuticular fashion. The skin was then cleansed and  Dermabond was used to close the skin opening.  Patient was then rotated back to the preoperative bed awakened from anesthesia and taken to recovery all counts are correct in this case.  I performed the entire procedure with the assistance of Debra Olp PA as an Pensions consultant.  Debra Kelly K. Debra Ribas MD

## 2017-09-20 NOTE — Discharge Instructions (Signed)

## 2017-09-20 NOTE — H&P (Signed)
  I have reviewed and confirmed my history and physical from 09/14/17 with no additions or changes. Plan for lumbar decompression with possible microdiscectomy (labeled L5/S1 on MRI from [facility]).  Risks and benefits reviewed.  Heart sounds normal no MRG. Chest Clear to Auscultation Bilaterally.

## 2017-09-20 NOTE — Anesthesia Post-op Follow-up Note (Signed)
Anesthesia QCDR form completed.        

## 2017-11-17 ENCOUNTER — Ambulatory Visit: Payer: Medicare HMO | Admitting: Hematology and Oncology

## 2017-11-17 ENCOUNTER — Telehealth: Payer: Self-pay | Admitting: Hematology and Oncology

## 2017-11-17 DIAGNOSIS — M25561 Pain in right knee: Secondary | ICD-10-CM | POA: Diagnosis not present

## 2017-11-17 DIAGNOSIS — M1990 Unspecified osteoarthritis, unspecified site: Secondary | ICD-10-CM | POA: Diagnosis not present

## 2017-11-17 DIAGNOSIS — C50411 Malignant neoplasm of upper-outer quadrant of right female breast: Secondary | ICD-10-CM

## 2017-11-17 DIAGNOSIS — M25662 Stiffness of left knee, not elsewhere classified: Secondary | ICD-10-CM

## 2017-11-17 DIAGNOSIS — L503 Dermatographic urticaria: Secondary | ICD-10-CM

## 2017-11-17 DIAGNOSIS — Z79811 Long term (current) use of aromatase inhibitors: Secondary | ICD-10-CM

## 2017-11-17 DIAGNOSIS — Z170 Estrogen receptor positive status [ER+]: Secondary | ICD-10-CM

## 2017-11-17 DIAGNOSIS — M4807 Spinal stenosis, lumbosacral region: Secondary | ICD-10-CM

## 2017-11-17 MED ORDER — BACLOFEN 10 MG PO TABS: 10.0000 mg | ORAL_TABLET | Freq: Three times a day (TID) | ORAL | 0 refills | Status: DC

## 2017-11-17 MED ORDER — ANASTROZOLE 1 MG PO TABS: 1.0000 mg | ORAL_TABLET | Freq: Every day | ORAL | 3 refills | Status: DC

## 2017-11-17 NOTE — Telephone Encounter (Signed)
Gave patient AVS / Calendar of upcoming December 2019 appt

## 2017-11-17 NOTE — Progress Notes (Signed)
Patient Care Team: Rusty Aus, MD as PCP - General (Internal Medicine) Alphonsa Overall, MD as Consulting Physician (General Surgery) Nicholas Lose, MD as Consulting Physician (Hematology and Oncology) Thea Silversmith, MD (Inactive) as Consulting Physician (Radiation Oncology) Mauro Kaufmann, RN as Registered Nurse Rockwell Germany, RN as Registered Nurse Holley Bouche, NP as Nurse Practitioner (Nurse Practitioner) Sylvan Cheese, NP as Nurse Practitioner (Hematology and Oncology)  DIAGNOSIS:  Encounter Diagnoses  Name Primary?  . Malignant neoplasm of upper-outer quadrant of right breast in female, estrogen receptor positive ([place])   . Malignant neoplasm of upper-outer quadrant of right female breast, unspecified estrogen receptor status ([place])     SUMMARY OF ONCOLOGIC HISTORY:   Breast cancer of upper-outer quadrant of right female breast ([place])   04/03/2015 Mammogram    LEFT breast: circumscribed oval mass in the outer quadrant measuring ~12 mm. This is posterior to a second oval mass that is unchanged from prior exams. 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 No chemotherapy Lindi Adie).      05/08/2015 Pathologic Stage    Stage IA: pT1c pN0      07/01/2015 - 08/04/2015 Radiation Therapy   Adjuvant RT Pablo Ledger): Right breast 50 Gy over 25 fractions      08/17/2015 -  Anti-estrogen oral therapy    Anastrozole 1 mg daily (Patricio Popwell). Planned duration of therapy 5 years.      09/24/2015 Survivorship    Survivorship visit completed and copy of care plan provided to patient.       CHIEF COMPLIANT: Follow-up on anastrozole therapy  INTERVAL HISTORY: Briahna Pescador is a 76 year old with above-mentioned history of right breast cancer treated with lumpectomy and adjuvant radiation.  She is currently on anastrozole since September 2016.  Overall she appears to be tolerating it fairly well.  She does appear to have multiple problems with her back and her knee.  She has had MRIs that showed disc protrusion as well as arthritis.  She denies any lumps or nodules in the breast.  She underwent laminectomy on the back as well as gel injections into her knees.  Back is doing remarkably well.  She still has pain in the knees but she is undergoing physical therapy.  REVIEW OF SYSTEMS:   Constitutional: Denies fevers, chills or abnormal weight loss Eyes: Denies blurriness of vision Ears, nose, mouth, throat, and face: Denies mucositis or sore throat Respiratory: Denies cough, dyspnea or wheezes Cardiovascular: Denies palpitation, chest discomfort Gastrointestinal:  Denies nausea, heartburn or change in bowel habits Skin: Denies abnormal skin rashes Lymphatics: Denies new lymphadenopathy or easy bruising Neurological:Denies numbness, tingling or new weaknesses Behavioral/Psych: Mood is stable, no new changes  Extremities: No lower extremity edema  Breast:  denies any pain or lumps or nodules in either breasts All other systems were reviewed with the patient and are negative.  I have reviewed the past medical history, past surgical history, social history and family history with the patient and they are unchanged from previous note.  ALLERGIES:  is allergic to shellfish allergy; radiaplexrx [skin  protectants, misc.]; adhesive [tape]; barbiturates; celebrex [celecoxib]; claritin [loratadine]; and codeine.  MEDICATIONS:  Current Outpatient Medications  Medication Sig Dispense Refill  . acetaminophen (TYLENOL) 500 MG tablet Take 1,000 mg by mouth every 6 (six) hours as needed for moderate pain or headache.    . ALPRAZolam (XANAX) 0.5 MG tablet Take 0.5 tablets (0.25 mg total) by mouth 2 (two) times daily as needed for anxiety. (Patient taking differently: Take 0.5 mg by mouth at bedtime. ) 60 tablet 0  . anastrozole (ARIMIDEX) 1 MG tablet Take 1 tablet (1 mg total) by mouth daily. 90 tablet 3  . Ascorbic Acid (VITAMIN C) 1000 MG tablet Take 1,000 mg by mouth daily.    . baclofen (LIORESAL) 10 MG tablet Take 1 tablet (10 mg total) by mouth 3 (three) times daily. 30 each 0  . Cholecalciferol (VITAMIN D PO) Take 600 mg by mouth daily.    Marland Kitchen levothyroxine (SYNTHROID, LEVOTHROID) 175 MCG tablet Take 175 mcg by mouth daily before breakfast.    . losartan (COZAAR) 50 MG tablet Take 1 tablet (50 mg total) by mouth daily.    . Magnesium 500 MG CAPS Take 250 mg by mouth daily.     . meloxicam (MOBIC) 15 MG tablet Take 15 mg by mouth daily.    Marland Kitchen oxybutynin (DITROPAN) 5 MG tablet Take 5 mg by mouth daily.     Marland Kitchen oxyCODONE (ROXICODONE) 5 MG immediate release tablet Take 1 tablet (5 mg total) by mouth every 4 (four) hours as needed for breakthrough pain. 30 tablet 0  . Potassium 99 MG TABS Take 1 tablet (99 mg total) by mouth 2 (two) times daily. (Patient taking differently: Take 99 mg by mouth 2 (two) times daily. ) 330 each 0  . vitamin B-12 (CYANOCOBALAMIN) 500 MCG tablet Take 500 mcg by mouth daily.     No current facility-administered medications for this visit.     PHYSICAL EXAMINATION: ECOG PERFORMANCE STATUS: 1 - Symptomatic but completely ambulatory  Vitals:   11/17/17 0907  BP: 132/76  Pulse: 96  Resp: 17  Temp: 98.5 F (36.9 C)  SpO2: 97%   Filed Weights   11/17/17 0907  Weight:  181 lb (82.1 kg)    GENERAL:alert, no distress and comfortable SKIN: skin color, texture, turgor are normal, no rashes or significant lesions EYES: normal, Conjunctiva are pink and non-injected, sclera clear OROPHARYNX:no exudate, no erythema and lips, buccal mucosa, and tongue normal  NECK: supple, thyroid normal size, non-tender, without nodularity LYMPH:  no palpable lymphadenopathy in the cervical, axillary or inguinal LUNGS: clear to auscultation and percussion with normal breathing effort HEART: regular rate & rhythm and no murmurs and no lower extremity edema ABDOMEN:abdomen soft, non-tender and normal bowel sounds MUSCULOSKELETAL:no cyanosis of digits and no clubbing  NEURO: alert & oriented x 3 with fluent speech, no focal motor/sensory deficits EXTREMITIES: No lower extremity edema, arthritis of the knee BREAST: No palpable masses or nodules in either right or left breasts. No palpable axillary supraclavicular or infraclavicular adenopathy no breast tenderness or nipple discharge. (exam performed in the presence of a chaperone)  LABORATORY DATA:  I have reviewed  the data as listed   Chemistry      Component Value Date/Time   NA 140 09/14/2017 1426   NA 142 04/22/2015 0804   K 4.0 09/14/2017 1426   K 4.2 04/22/2015 0804   CL 103 09/14/2017 1426   CO2 27 09/14/2017 1426   CO2 26 04/22/2015 0804   BUN 23 (H) 09/14/2017 1426   BUN 15.1 04/22/2015 0804   CREATININE 0.72 09/14/2017 1426   CREATININE 0.7 04/22/2015 0804      Component Value Date/Time   CALCIUM 9.4 09/14/2017 1426   CALCIUM 9.0 04/22/2015 0804   ALKPHOS 67 04/22/2015 0804   AST 13 04/22/2015 0804   ALT 13 04/22/2015 0804   BILITOT 0.50 04/22/2015 0804       Lab Results  Component Value Date   WBC 4.6 09/14/2017   HGB 12.6 09/14/2017   HCT 37.0 09/14/2017   MCV 92.3 09/14/2017   PLT 250 09/14/2017   NEUTROABS 3.4 09/14/2017    ASSESSMENT & PLAN:  Breast cancer of upper-outer quadrant of right  female breast Right breast lumpectomy 05/08/15: 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  Breast exam 11/17/2017: Benign 2. 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 Return to clinic in 1 year for follow-up   I spent 25 minutes talking to the patient of which more than half was spent in counseling and coordination of care.  No orders of the defined types were placed in this encounter.  The patient has a good understanding of the overall plan. she agrees with it. she will call with any problems that may develop before the next visit here.   Rulon Eisenmenger, MD 11/17/17

## 2017-11-17 NOTE — Assessment & Plan Note (Signed)
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 Breast cancer surveillance: 1.  Breast exam 11/17/2017: Benign 2. 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  Return to clinic in 1 year for follow-up

## 2017-12-26 DIAGNOSIS — E782 Mixed hyperlipidemia: Secondary | ICD-10-CM | POA: Insufficient documentation

## 2018-01-04 ENCOUNTER — Other Ambulatory Visit: Payer: Self-pay | Admitting: Sports Medicine

## 2018-01-04 DIAGNOSIS — M5136 Other intervertebral disc degeneration, lumbar region: Secondary | ICD-10-CM

## 2018-01-05 ENCOUNTER — Other Ambulatory Visit: Payer: Self-pay | Admitting: Hematology and Oncology

## 2018-01-05 ENCOUNTER — Other Ambulatory Visit: Payer: Self-pay | Admitting: Sports Medicine

## 2018-01-05 DIAGNOSIS — M5136 Other intervertebral disc degeneration, lumbar region: Secondary | ICD-10-CM

## 2018-01-05 DIAGNOSIS — Z9889 Other specified postprocedural states: Secondary | ICD-10-CM

## 2018-01-09 ENCOUNTER — Ambulatory Visit
Admission: RE | Admit: 2018-01-09 | Discharge: 2018-01-09 | Disposition: A | Discharge status: Home / Self Care | Payer: Medicare HMO | Source: Ambulatory Visit | Attending: Sports Medicine | Admitting: Sports Medicine

## 2018-01-09 DIAGNOSIS — M5136 Other intervertebral disc degeneration, lumbar region: Secondary | ICD-10-CM

## 2018-03-15 ENCOUNTER — Encounter: Payer: Self-pay | Admitting: Student in an Organized Health Care Education/Training Program

## 2018-03-15 ENCOUNTER — Other Ambulatory Visit: Payer: Self-pay

## 2018-03-15 ENCOUNTER — Ambulatory Visit
Payer: Medicare HMO | Attending: Student in an Organized Health Care Education/Training Program | Admitting: Student in an Organized Health Care Education/Training Program

## 2018-03-15 ENCOUNTER — Encounter: Payer: Self-pay | Admitting: Hematology and Oncology

## 2018-03-15 VITALS — BP 149/75 | HR 87 | Temp 98.3°F | Resp 16 | Ht 62.0 in | Wt 173.0 lb

## 2018-03-15 DIAGNOSIS — M51369 Other intervertebral disc degeneration, lumbar region without mention of lumbar back pain or lower extremity pain: Secondary | ICD-10-CM | POA: Insufficient documentation

## 2018-03-15 DIAGNOSIS — Z9889 Other specified postprocedural states: Secondary | ICD-10-CM | POA: Insufficient documentation

## 2018-03-15 DIAGNOSIS — M79605 Pain in left leg: Secondary | ICD-10-CM | POA: Diagnosis present

## 2018-03-15 DIAGNOSIS — M961 Postlaminectomy syndrome, not elsewhere classified: Secondary | ICD-10-CM | POA: Diagnosis not present

## 2018-03-15 DIAGNOSIS — M9983 Other biomechanical lesions of lumbar region: Secondary | ICD-10-CM | POA: Diagnosis not present

## 2018-03-15 DIAGNOSIS — Z90710 Acquired absence of both cervix and uterus: Secondary | ICD-10-CM | POA: Diagnosis not present

## 2018-03-15 DIAGNOSIS — M5136 Other intervertebral disc degeneration, lumbar region: Secondary | ICD-10-CM | POA: Insufficient documentation

## 2018-03-15 DIAGNOSIS — M5416 Radiculopathy, lumbar region: Secondary | ICD-10-CM | POA: Diagnosis not present

## 2018-03-15 DIAGNOSIS — G894 Chronic pain syndrome: Secondary | ICD-10-CM | POA: Diagnosis not present

## 2018-03-15 DIAGNOSIS — Z79891 Long term (current) use of opiate analgesic: Secondary | ICD-10-CM | POA: Insufficient documentation

## 2018-03-15 DIAGNOSIS — M48061 Spinal stenosis, lumbar region without neurogenic claudication: Secondary | ICD-10-CM | POA: Insufficient documentation

## 2018-03-15 DIAGNOSIS — M1711 Unilateral primary osteoarthritis, right knee: Secondary | ICD-10-CM | POA: Diagnosis not present

## 2018-03-15 DIAGNOSIS — Z79899 Other long term (current) drug therapy: Secondary | ICD-10-CM | POA: Diagnosis not present

## 2018-03-15 DIAGNOSIS — Z9851 Tubal ligation status: Secondary | ICD-10-CM | POA: Insufficient documentation

## 2018-03-15 NOTE — Patient Instructions (Signed)
____________________________________________________________________________________________  General Risks and Possible Complications  Patient Responsibilities: It is important that you read this as it is part of your informed consent. It is our duty to inform you of the risks and possible complications associated with treatments offered to you. It is your responsibility as a patient to read this and to ask questions about anything that is not clear or that you believe was not covered in this document.  Patient's Rights: You have the right to refuse treatment. You also have the right to change your mind, even after initially having agreed to have the treatment done. However, under this last option, if you wait until the last second to change your mind, you may be charged for the materials used up to that point.  Introduction: Medicine is not an exact science. Everything in Medicine, including the lack of treatment(s), carries the potential for danger, harm, or loss (which is by definition: Risk). In Medicine, a complication is a secondary problem, condition, or disease that can aggravate an already existing one. All treatments carry the risk of possible complications. The fact that a side effects or complications occurs, does not imply that the treatment was conducted incorrectly. It must be clearly understood that these can happen even when everything is done following the highest safety standards.  No treatment: You can choose not to proceed with the proposed treatment alternative. The "PRO(s)" would include: avoiding the risk of complications associated with the therapy. The "CON(s)" would include: not getting any of the treatment benefits. These benefits fall under one of three categories: diagnostic; therapeutic; and/or palliative. Diagnostic benefits include: getting information which can ultimately lead to improvement of the disease or symptom(s). Therapeutic benefits are those associated with the  successful treatment of the disease. Finally, palliative benefits are those related to the decrease of the primary symptoms, without necessarily curing the condition (example: decreasing the pain from a flare-up of a chronic condition, such as incurable terminal cancer).  General Risks and Complications: These are associated to most interventional treatments. They can occur alone, or in combination. They fall under one of the following six (6) categories: no benefit or worsening of symptoms; bleeding; infection; nerve damage; allergic reactions; and/or death. 1. No benefits or worsening of symptoms: In Medicine there are no guarantees, only probabilities. No healthcare provider can ever guarantee that a medical treatment will work, they can only state the probability that it may. Furthermore, there is always the possibility that the condition may worsen, either directly, or indirectly, as a consequence of the treatment. 2. Bleeding: This is more common if the patient is taking a blood thinner, either prescription or over the counter (example: Goody Powders, Fish oil, Aspirin, Garlic, etc.), or if suffering a condition associated with impaired coagulation (example: Hemophilia, cirrhosis of the liver, low platelet counts, etc.). However, even if you do not have one on these, it can still happen. If you have any of these conditions, or take one of these drugs, make sure to notify your treating physician. 3. Infection: This is more common in patients with a compromised immune system, either due to disease (example: diabetes, cancer, human immunodeficiency virus [HIV], etc.), or due to medications or treatments (example: therapies used to treat cancer and rheumatological diseases). However, even if you do not have one on these, it can still happen. If you have any of these conditions, or take one of these drugs, make sure to notify your treating physician. 4. Nerve Damage: This is more common when the   treatment is  an invasive one, but it can also happen with the use of medications, such as those used in the treatment of cancer. The damage can occur to small secondary nerves, or to large primary ones, such as those in the spinal cord and brain. This damage may be temporary or permanent and it may lead to impairments that can range from temporary numbness to permanent paralysis and/or brain death. 5. Allergic Reactions: Any time a substance or material comes in contact with our body, there is the possibility of an allergic reaction. These can range from a mild skin rash (contact dermatitis) to a severe systemic reaction (anaphylactic reaction), which can result in death. 6. Death: In general, any medical intervention can result in death, most of the time due to an unforeseen complication. ____________________________________________________________________________________________  Epidural Steroid Injection Patient Information  Description: The epidural space surrounds the nerves as they exit the spinal cord.  In some patients, the nerves can be compressed and inflamed by a bulging disc or a tight spinal canal (spinal stenosis).  By injecting steroids into the epidural space, we can bring irritated nerves into direct contact with a potentially helpful medication.  These steroids act directly on the irritated nerves and can reduce swelling and inflammation which often leads to decreased pain.  Epidural steroids may be injected anywhere along the spine and from the neck to the low back depending upon the location of your pain.   After numbing the skin with local anesthetic (like Novocaine), a small needle is passed into the epidural space slowly.  You may experience a sensation of pressure while this is being done.  The entire block usually last less than 10 minutes.  Conditions which may be treated by epidural steroids:   Low back and leg pain  Neck and arm pain  Spinal stenosis  Post-laminectomy  syndrome  Herpes zoster (shingles) pain  Pain from compression fractures  Preparation for the injection:  1. Do not eat any solid food or dairy products within 8 hours of your appointment.  2. You may drink clear liquids up to 3 hours before appointment.  Clear liquids include water, black coffee, juice or soda.  No milk or cream please. 3. You may take your regular medication, including pain medications, with a sip of water before your appointment  Diabetics should hold regular insulin (if taken separately) and take 1/2 normal NPH dos the morning of the procedure.  Carry some sugar containing items with you to your appointment. 4. A driver must accompany you and be prepared to drive you home after your procedure.  5. Bring all your current medications with your. 6. An IV may be inserted and sedation may be given at the discretion of the physician.   7. A blood pressure cuff, EKG and other monitors will often be applied during the procedure.  Some patients may need to have extra oxygen administered for a short period. 8. You will be asked to provide medical information, including your allergies, prior to the procedure.  We must know immediately if you are taking blood thinners (like Coumadin/Warfarin)  Or if you are allergic to IV iodine contrast (dye). We must know if you could possible be pregnant.  Possible side-effects:  Bleeding from needle site  Infection (rare, may require surgery)  Nerve injury (rare)  Numbness & tingling (temporary)  Difficulty urinating (rare, temporary)  Spinal headache ( a headache worse with upright posture)  Light -headedness (temporary)  Pain at injection site (several   days)  Decreased blood pressure (temporary)  Weakness in arm/leg (temporary)  Pressure sensation in back/neck (temporary)  Call if you experience:  Fever/chills associated with headache or increased back/neck pain.  Headache worsened by an upright position.  New onset  weakness or numbness of an extremity below the injection site  Hives or difficulty breathing (go to the emergency room)  Inflammation or drainage at the infection site  Severe back/neck pain  Any new symptoms which are concerning to you  Please note:  Although the local anesthetic injected can often make your back or neck feel good for several hours after the injection, the pain will likely return.  It takes 3-7 days for steroids to work in the epidural space.  You may not notice any pain relief for at least that one week.  If effective, we will often do a series of three injections spaced 3-6 weeks apart to maximally decrease your pain.  After the initial series, we generally will wait several months before considering a repeat injection of the same type.  If you have any questions, please call [phone] [facility] 

## 2018-03-15 NOTE — Progress Notes (Signed)
Patient's Name: Debra Kelly  MRN: [id number]  Referring Provider: Diamond Nickel, MD  DOB: Jan 31, 1941  PCP: Rusty Aus, MD  DOS: 03/15/2018  Note by: Gillis Santa, MD  Service setting: Ambulatory outpatient  Specialty: Interventional Pain Management  Location: [facility] (AMB) Pain Management Facility  Visit type: Initial Patient Evaluation  Patient type: New Patient   Primary Reason(s) for Visit: Encounter for initial evaluation of one or more chronic problems (new to examiner) potentially causing chronic pain, and posing a threat to normal musculoskeletal function. (Level of risk: High) CC: Leg Pain (left)  HPI  [name] is a 77 y.o. year old, female patient, who comes today to see [place] for the first time for an initial evaluation of her chronic pain. She has Breast cancer of upper-outer quadrant of right female breast ([place]); Dermatitis; Primary osteoarthritis of right knee; Lumbar radiculopathy  (LEFT L5, S1); Post laminectomy syndrome; Lumbar degenerative disc disease; Lumbar foraminal stenosis; and Chronic pain syndrome on their problem list. Today she comes in for evaluation of her Leg Pain (left)  Pain Assessment: Location: Left Leg Radiating: radiates from left buttock down back of leg to calf and ankle.  Onset: More than a month ago Duration: Chronic pain Quality: Aching, Sharp Severity: 6 /10 (self-reported pain score)  Note: Reported level is compatible with observation.                         When using our objective Pain Scale, levels between 6 and 10/10 are said to belong in an emergency room, as it progressively worsens from a 6/10, described as severely limiting, requiring emergency care not usually available at an outpatient pain management facility. At a 6/10 level, communication becomes difficult and requires great effort. Assistance to reach the emergency department may be required. Facial flushing and profuse sweating along with potentially dangerous increases in heart rate and  blood pressure will be evident. Effect on ADL:   Timing: Intermittent Modifying factors: heat  Onset and Duration: Gradual, Date of onset: 06-21-2016 and Present longer than 3 months Cause of pain: fell in [place] on both knees Severity: NAS-11 at its worse: 10/10, NAS-11 at its best: 6/10, NAS-11 now: 5/10 and NAS-11 on the average: 5/10 Timing: Afternoon, During activity or exercise and After activity or exercise Aggravating Factors: Climbing, Lifiting, Prolonged sitting and Prolonged standing Alleviating Factors: Stretching, Cold packs, Hot packs, Lying down, Medications and Resting Associated Problems: Fatigue, Numbness, Swelling and Weakness Quality of Pain: Aching, Distressing, Nagging, Shooting and Tingling Previous Examinations or Tests: Bone scan, EMG/PNCV, MRI scan, Nerve block, Neurosurgical evaluation and Orthopedic evaluation Previous Treatments: Epidural steroid injections and Physical Therapy  The patient comes into the clinics today for the first time for a chronic pain management evaluation.   77 year old female with a chief complaint of axial low back pain that radiates down into her left leg, calf, left ankle and a posterior lateral distribution.  Of note patient had a fall in July 2017 in a car port.  Patient went on to have lumbar spine surgery September 20, 2017 which was a laminectomy/decompression microdiscectomy with Dr. Cari Caraway.  Patient states that the surgery helped but she continues to have persistent pain in her left posterior lateral thigh and lateral leg.  She finds this pain very debilitating.  Patient enjoys doing outdoor activities and states that she is limited given her pain.  She does walk with a compensatory, antalgic gait.  Patient has not tried  any epidural steroid injections since her surgery.  She has tried epidural steroid injections prior to her surgery.  Patient does participate with physical therapy 3 times a week.  EMG study performed 01/23/2018  shows generalized sensorimotor polyneuropathy, superimposed left L5 chronic radiculopathy.  She is referred here for consideration of lumbar ESI and or spinal cord stimulation.  Today I took the time to provide the patient with information regarding my pain practice. The patient was informed that my practice is divided into two sections: an interventional pain management section, as well as a completely separate and distinct medication management section. I explained that I have procedure days for my interventional therapies, and evaluation days for follow-ups and medication management. Because of the amount of documentation required during both, they are kept separated. This means that there is the possibility that she may be scheduled for a procedure on one day, and medication management the next. I have also informed her that because of staffing and facility limitations, I no longer take patients for medication management only. To illustrate the reasons for this, I gave the patient the example of surgeons, and how inappropriate it would be to refer a patient to his/her care, just to write for the post-surgical antibiotics on a surgery done by a different surgeon.   Because interventional pain management is my board-certified specialty, the patient was informed that joining my practice means that they are open to any and all interventional therapies. I made it clear that this does not mean that they will be forced to have any procedures done. What this means is that I believe interventional therapies to be essential part of the diagnosis and proper management of chronic pain conditions. Therefore, patients not interested in these interventional alternatives will be better served under the care of a different practitioner.  The patient was also made aware of my Comprehensive Pain Management Safety Guidelines where by joining my practice, they limit all of their nerve blocks and joint injections to those done  by our practice, for as long as we are retained to manage their care.    Meds   Current Outpatient Medications:  .  ALPRAZolam (XANAX) 0.5 MG tablet, Take 0.5 tablets (0.25 mg total) by mouth 2 (two) times daily as needed for anxiety. (Patient taking differently: Take 0.5 mg by mouth at bedtime. ), Disp: 60 tablet, Rfl: 0 .  anastrozole (ARIMIDEX) 1 MG tablet, Take 1 tablet (1 mg total) by mouth daily., Disp: 90 tablet, Rfl: 3 .  aspirin EC 81 MG tablet, Take by mouth., Disp: , Rfl:  .  baclofen (LIORESAL) 10 MG tablet, Take 1 tablet (10 mg total) by mouth 3 (three) times daily., Disp: 30 each, Rfl: 0 .  baclofen (LIORESAL) 10 MG tablet, TAKE 1 TABLET BY MOUTH 3 TIMES DAILY, Disp: , Rfl:  .  levothyroxine (SYNTHROID, LEVOTHROID) 175 MCG tablet, Take 175 mcg by mouth daily before breakfast., Disp: , Rfl:  .  losartan (COZAAR) 50 MG tablet, TAKE 1 TABLET BY MOUTH EVERY DAY, Disp: , Rfl:  .  meloxicam (MOBIC) 15 MG tablet, Take 15 mg by mouth daily., Disp: , Rfl:  .  oxybutynin (DITROPAN) 5 MG tablet, Take 5 mg by mouth daily. , Disp: , Rfl:  .  Potassium 99 MG TABS, Take 1 tablet (99 mg total) by mouth 2 (two) times daily. (Patient taking differently: Take 99 mg by mouth 2 (two) times daily. ), Disp: 330 each, Rfl: 0 .  traMADol (ULTRAM) 50 MG  tablet, TAKE 1 TABLET (50 MG TOTAL) BY MOUTH 2 (TWO) TIMES DAILY AS NEEDED FOR PAIN, Disp: , Rfl: 1 .  vitamin B-12 (CYANOCOBALAMIN) 500 MCG tablet, Take 500 mcg by mouth daily., Disp: , Rfl:  .  acetaminophen (TYLENOL) 500 MG tablet, Take 1,000 mg by mouth every 6 (six) hours as needed for moderate pain or headache., Disp: , Rfl:  .  Ascorbic Acid (VITAMIN C) 1000 MG tablet, Take 1,000 mg by mouth daily., Disp: , Rfl:  .  Cholecalciferol (VITAMIN D PO), Take 600 mg by mouth daily., Disp: , Rfl:  .  losartan (COZAAR) 50 MG tablet, Take 1 tablet (50 mg total) by mouth daily. (Patient not taking: Reported on 03/15/2018), Disp: , Rfl:  .  Magnesium 500 MG CAPS,  Take 250 mg by mouth daily. , Disp: , Rfl:  .  oxyCODONE (ROXICODONE) 5 MG immediate release tablet, Take 1 tablet (5 mg total) by mouth every 4 (four) hours as needed for breakthrough pain. (Patient not taking: Reported on 03/15/2018), Disp: 30 tablet, Rfl: 0  Imaging Review  C Lumbosacral Imaging: Lumbar MR wo contrast:  Results for orders placed during the hospital encounter of 01/09/18  MR LUMBAR SPINE WO CONTRAST   Narrative CLINICAL DATA:  Degenerative disc disease of the lumbar spine. Gluteal pain radiating to the left leg since 2017.  EXAM: MRI LUMBAR SPINE WITHOUT CONTRAST  TECHNIQUE: Multiplanar, multisequence MR imaging of the lumbar spine was performed. No intravenous contrast was administered.  COMPARISON:  04/13/2017  FINDINGS: Segmentation: Same numbering. There is a transitional S1 vertebra scheme as previous is used with rudimentary S1-2 disc space.  Alignment:  Levoscoliosis.  Slight anterolisthesis at L5-S1.  Vertebrae:  No fracture, evidence of discitis, or bone lesion.  Conus medullaris and cauda equina: Conus extends to the L1 level. Conus and cauda equina appear normal.  Paraspinal and other soft tissues: Bilateral renal sinus cysts. Asymmetric atrophy of the left psoas.  Disc levels:  T12- L1: Mild disc narrowing.  No impingement  L1-L2: Asymmetric rightward disc height loss and bulging. No impingement  L2-L3: Asymmetric rightward disc height loss and far-lateral spurring. Asymmetric right facet spurring. Moderate right foraminal stenosis. Mild-to-moderate spinal stenosis.  L3-L4: Disc narrowing and bulging circumferentially. Hypertrophic posterior elements. Advanced spinal stenosis. Left more than right foraminal crowding.  L4-L5: Mild disc narrowing and bulging. Degenerative posterior element hypertrophy. Mild spinal stenosis and left more than right subarticular recess narrowing that is not compressive.  L5-S1:Advanced disc degeneration  with left-sided height loss and bulging with spur. Posterior element hypertrophy. Improved thecal sac patency after lumbar decompression. There is still a spur or focal ligamentous thickening ventral to the left facet with left S1 root compression. Severe left foraminal impingement due to disc height loss and disc bulging.  IMPRESSION: 1. Transitional anatomy as previously defined. 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 2. Diffuse advanced degenerative change with levoscoliosis. 3. L5-S1 improved thecal sac patency after interval decompression but there is still continued left S1 and L5 compression in the subarticular recess and foramen. 4. L4-5 mild spinal stenosis. Left subarticular recess narrowing that could affect the L5 nerve root. 5. L3-4 advanced spinal stenosis. 6. L2-3 mild to moderate spinal stenosis. Moderate right foraminal impingement.   Electronically Signed   By: Monte Fantasia M.D.   On: 01/09/2018 09:04     Lumbar DG 2-3 views:  Results for orders placed during the hospital encounter of 09/20/17  DG Lumbar Spine 2-3 Views  Narrative CLINICAL DATA:  Lumbar spine surgery.  EXAM: DG C-ARM 61-120 MIN; LUMBAR SPINE - 2-3 VIEW  COMPARISON:  Lumbar spine MRI 04/13/2017  FLUOROSCOPY TIME:  C-arm fluoroscopic images were obtained intraoperatively and submitted for post operative interpretation. Please see the performing provider's procedural report for the fluoroscopy time utilized.  FINDINGS: A single intraoperative spot fluoroscopic image of the lumbosacral junction in the lateral projection is provided. The tip of a surgical instrument projects over the posterior aspect of the S1 vertebral body.  IMPRESSION: Intraoperative localization as above.   Electronically Signed   By: Logan Bores M.D.   On: 09/20/2017 08:30      Knee-R MR wo contrast:  Results for orders placed during the hospital encounter of 07/15/17  MR KNEE  RIGHT WO CONTRAST   Narrative CLINICAL DATA:  Medial and anterior right knee pain and swelling for 1 year. History of prior knee surgery, most recently in 2000. No known injury.  EXAM: MRI OF THE RIGHT KNEE WITHOUT CONTRAST  TECHNIQUE: Multiplanar, multisequence MR imaging of the knee was performed. No intravenous contrast was administered.  COMPARISON:  MRI right knee 02/04/2011.  FINDINGS: MENISCI  Medial meniscus: Blunting along the free edge of the posterior horn is most consistent with prior debridement of a meniscal tear and unchanged. No medial meniscal tear is identified. The body is somewhat diminutive.  Lateral meniscus: The anterior horn is not visualized as on the prior exam which could be due to prior meniscectomy or degenerative maceration. Only a tiny remnant of the body is now seen consistent with degenerative maceration, worse than on the prior exam. There is a new large horizontal tear in the posterior horn and the posterior horn is severely degenerated.  LIGAMENTS  Cruciates: Intact. Mucoid degeneration the ACL appears worse than on the prior study.  Collaterals:  Intact.  CARTILAGE  Patellofemoral: Fraying and irregularity of cartilage surfaces of both the patella and trochlea have worsened.  Medial:  Thinned throughout without focal defect.  Lateral: Cartilage is now completely denuded, new since the prior MRI.  Joint:  Small joint effusion.  Popliteal Fossa:  No Baker's cyst.  Extensor Mechanism:  Intact.  Bones: There has been marked increase in subchondral edema about the lateral compartment. Bulky tricompartmental osteophytosis is worse than on the prior exam. No fracture.  Other: None.  IMPRESSION: Dominant finding is progressive osteoarthritis about the knee appearing worst in the lateral compartment where it is severe.  New horizontal tear posterior horn lateral meniscus. There is also new degenerative maceration of the body  of the lateral meniscus. No normal-appearing meniscal tissue in the body is identified. Nonvisualization of the anterior horn lateral meniscus is unchanged and could be due to prior surgery or degenerative maceration.  Increased mucoid degeneration of the ACL.  The ACL is intact.   Electronically Signed   By: Inge Rise M.D.   On: 07/15/2017 16:31     Knee-R DG 4 views:  Results for orders placed during the hospital encounter of 07/16/09  DG Knee Complete 4 Views Right   Narrative Clinical Data: Pain for a week, no injury   RIGHT KNEE - COMPLETE 4+ VIEW   Comparison: None   Findings: There is slight loss of medial joint space with minimal sclerosis.  No acute fracture is seen.  No definite joint effusion is noted.  The patella appears normally positioned in the patellofemoral groove.   IMPRESSION: Slight loss of medial joint space.  No fracture or effusion.  Provider: Oliver Pila    Complexity Note: Imaging results reviewed. Results shared with Ms. Serafin, using Layman's terms.                         ROS  Cardiovascular: Daily Aspirin intake and High blood pressure Pulmonary or Respiratory: No reported pulmonary signs or symptoms such as wheezing and difficulty taking a deep full breath (Asthma), difficulty blowing air out (Emphysema), coughing up mucus (Bronchitis), persistent dry cough, or temporary stoppage of breathing during sleep Neurological: No reported neurological signs or symptoms such as seizures, abnormal skin sensations, urinary and/or fecal incontinence, being born with an abnormal open spine and/or a tethered spinal cord Review of Past Neurological Studies: No results found for this or any previous visit. Psychological-Psychiatric: No reported psychological or psychiatric signs or symptoms such as difficulty sleeping, anxiety, depression, delusions or hallucinations (schizophrenial), mood swings (bipolar disorders) or suicidal ideations or  attempts Gastrointestinal: No reported gastrointestinal signs or symptoms such as vomiting or evacuating blood, reflux, heartburn, alternating episodes of diarrhea and constipation, inflamed or scarred liver, or pancreas or irrregular and/or infrequent bowel movements Genitourinary: No reported renal or genitourinary signs or symptoms such as difficulty voiding or producing urine, peeing blood, non-functioning kidney, kidney stones, difficulty emptying the bladder, difficulty controlling the flow of urine, or chronic kidney disease Hematological: Weakness due to low blood hemoglobin or red blood cell count (Anemia) Endocrine: Slow thyroid Rheumatologic: Joint aches and or swelling due to excess weight (Osteoarthritis) Musculoskeletal: Negative for myasthenia gravis, muscular dystrophy, multiple sclerosis or malignant hyperthermia Work History: Retired  Allergies  Ms. Denomme is allergic to shellfish allergy; lisinopril; radiaplexrx [skin protectants, misc.]; adhesive [tape]; amoxicillin-pot clavulanate; barbiturates; celecoxib; codeine; loratadine; and tapentadol.  Laboratory Chemistry  Inflammation Markers (CRP: Acute Phase) (ESR: Chronic Phase) No results found for: CRP, ESRSEDRATE, LATICACIDVEN                       Rheumatology Markers No results found for: RF, ANA, LABURIC, URICUR, LYMEIGGIGMAB, LYMEABIGMQN                      Renal Function Markers Lab Results  Component Value Date   BUN 23 (H) 09/14/2017   CREATININE 0.72 09/14/2017   GFRAA >60 09/14/2017   GFRNONAA >60 09/14/2017                              Hepatic Function Markers Lab Results  Component Value Date   AST 13 04/22/2015   ALT 13 04/22/2015   ALBUMIN 3.6 04/22/2015   ALKPHOS 67 04/22/2015                        Electrolytes Lab Results  Component Value Date   NA 140 09/14/2017   K 4.0 09/14/2017   CL 103 09/14/2017   CALCIUM 9.4 09/14/2017                        Neuropathy Markers No results found  for: VITAMINB12, FOLATE, HGBA1C, HIV                      Bone Pathology Markers No results found for: [place], [id number], [id number], [id number], 25OHVITD1, 25OHVITD2, 25OHVITD3, TESTOFREE, TESTOSTERONE  Coagulation Parameters Lab Results  Component Value Date   INR 1.03 09/14/2017   LABPROT 13.4 09/14/2017   APTT 35 09/14/2017   PLT 250 09/14/2017                        Cardiovascular Markers Lab Results  Component Value Date   HGB 12.6 09/14/2017   HCT 37.0 09/14/2017                         CA Markers No results found for: CEA, CA125, LABCA2                      Note: Lab results reviewed.  PFSH  Drug: Ms. Vohra  reports that she does not use drugs. Alcohol:  reports that she drinks about 0.6 oz of alcohol per week. Tobacco:  reports that she has never smoked. She has never used smokeless tobacco. Medical:  has a past medical history of Allergy, Arthritis (09-13-12), Breast cancer ([place]) (04/16/15), Breast cancer of upper-outer quadrant of right female breast ([place]) (04/20/2015), Hypertension, Hypothyroidism (09-13-12), and Radiation (07/01/15-08/04/15). Family: family history includes Prostate cancer in her brother and brother.  Past Surgical History:  Procedure Laterality Date  . ABDOMINAL HYSTERECTOMY  09-13-12   '88  . APPENDECTOMY  09-13-12   '76  . BREAST BIOPSY Right 04/16/2015   Stereo- Malignant  . BREAST BIOPSY Left 02/18/2014   U/S Core- Benign  . BREAST EXCISIONAL BIOPSY Right 1993   Benign  . BREAST LUMPECTOMY Right 05/08/2015  . BREAST LUMPECTOMY WITH RADIOACTIVE SEED AND SENTINEL LYMPH NODE BIOPSY Right 05/08/2015   Procedure: RIGHT BREAST LUMPECTOMY WITH RADIOACTIVE SEED AND RIGHT AXILLARY SENTINEL LYMPH NODE BIOPSY;  Surgeon: Alphonsa Overall, MD;  Location: [facility];  Service: General;  Laterality: Right;  . BREAST SURGERY  09-13-12   '86-rt. breast lumpectomy-benign  . CYSTOCELE REPAIR  09/18/2012   Procedure: ANTERIOR REPAIR  (CYSTOCELE);  Surgeon: Reece Packer, MD;  Location: [facility];  Service: Urology;  Laterality: N/A;  Cystocele/Vault Repair/Cystoscopy and Graft  . CYSTOSCOPY  09/18/2012   Procedure: CYSTOSCOPY;  Surgeon: Reece Packer, MD;  Location: [facility];  Service: Urology;  Laterality: N/A;  . KNEE ARTHROSCOPY  09-13-12   '04-lt. knee/ '10,'11-right knee scopes  . LUMBAR LAMINECTOMY/DECOMPRESSION MICRODISCECTOMY N/A 09/20/2017   Procedure: LUMBAR LAMINECTOMY/DECOMPRESSION MICRODISCECTOMY 1 LEVEL-L5-S1;  Surgeon: Meade Maw, MD;  Location: [facility];  Service: Neurosurgery;  Laterality: N/A;  . RECTOCELE REPAIR N/A 10/15/2013   Procedure: RECTOCELE REPAIR WITH GRAFT  ANAD VAULT SUSPINSION;  Surgeon: Reece Packer, MD;  Location: [facility];  Service: Urology;  Laterality: N/A;  . TUBAL LIGATION  09-13-12   '76  . VAGINAL PROLAPSE REPAIR  09/18/2012   Procedure: VAGINAL VAULT SUSPENSION;  Surgeon: Reece Packer, MD;  Location: [facility];  Service: Urology;  Laterality: N/A;   Active Ambulatory Problems    Diagnosis Date Noted  . Breast cancer of upper-outer quadrant of right female breast ([place]) 04/20/2015  . Dermatitis 11/17/2015  . Primary osteoarthritis of right knee 07/25/2017  . Lumbar radiculopathy  (LEFT L5, S1) 03/15/2018  . Post laminectomy syndrome 03/15/2018  . Lumbar degenerative disc disease 03/15/2018  . Lumbar foraminal stenosis 03/15/2018  . Chronic pain syndrome 03/15/2018   Resolved Ambulatory Problems    Diagnosis Date Noted  . No Resolved Ambulatory Problems   Past Medical History:  Diagnosis Date  .  Allergy   . Arthritis 09-13-12  . Breast cancer ([place]) 04/16/15  . Breast cancer of upper-outer quadrant of right female breast ([place]) 04/20/2015  . Hypertension   . Hypothyroidism 09-13-12  . Radiation 07/01/15-08/04/15   Constitutional Exam  General appearance: Well nourished, well developed, and well hydrated. In no apparent acute distress Vitals:   03/15/18 0857   BP: (!) 149/75  Pulse: 87  Resp: 16  Temp: 98.3 F (36.8 C)  SpO2: 100%  Weight: 173 lb (78.5 kg)  Height: _0  (1.575 m)   BMI Assessment: Estimated body mass index is 31.64 kg/m as calculated from the following:   Height as of this encounter: _1  (1.575 m).   Weight as of this encounter: 173 lb (78.5 kg).  BMI interpretation table: BMI level Category Range association with higher incidence of chronic pain  <18 kg/m2 Underweight   18.5-24.9 kg/m2 Ideal body weight   25-29.9 kg/m2 Overweight Increased incidence by 20%  30-34.9 kg/m2 Obese (Class I) Increased incidence by 68%  35-39.9 kg/m2 Severe obesity (Class II) Increased incidence by 136%  >40 kg/m2 Extreme obesity (Class III) Increased incidence by 254%   BMI Readings from Last 4 Encounters:  03/15/18 31.64 kg/m  11/17/17 33.11 kg/m  09/20/17 33.29 kg/m  09/14/17 33.29 kg/m   Wt Readings from Last 4 Encounters:  03/15/18 173 lb (78.5 kg)  11/17/17 181 lb (82.1 kg)  09/20/17 182 lb (82.6 kg)  09/14/17 182 lb (82.6 kg)  Psych/Mental status: Alert, oriented x 3 (person, place, & time)       Eyes: PERLA Respiratory: No evidence of acute respiratory distress  Cervical Spine Area Exam  Skin & Axial Inspection: No masses, redness, edema, swelling, or associated skin lesions Alignment: Symmetrical Functional ROM: Unrestricted ROM      Stability: No instability detected Muscle Tone/Strength: Functionally intact. No obvious neuro-muscular anomalies detected. Sensory (Neurological): Unimpaired Palpation: No palpable anomalies              Upper Extremity (UE) Exam    Side: Right upper extremity  Side: Left upper extremity  Skin & Extremity Inspection: Skin color, temperature, and hair growth are WNL. No peripheral edema or cyanosis. No masses, redness, swelling, asymmetry, or associated skin lesions. No contractures.  Skin & Extremity Inspection: Skin color, temperature, and hair growth are WNL. No peripheral edema  or cyanosis. No masses, redness, swelling, asymmetry, or associated skin lesions. No contractures.  Functional ROM: Unrestricted ROM          Functional ROM: Unrestricted ROM          Muscle Tone/Strength: Functionally intact. No obvious neuro-muscular anomalies detected.  Muscle Tone/Strength: Functionally intact. No obvious neuro-muscular anomalies detected.  Sensory (Neurological): Unimpaired          Sensory (Neurological): Unimpaired          Palpation: No palpable anomalies              Palpation: No palpable anomalies              Specialized Test(s): Deferred         Specialized Test(s): Deferred          Thoracic Spine Area Exam  Skin & Axial Inspection: No masses, redness, or swelling Alignment: Symmetrical Functional ROM: Unrestricted ROM Stability: No instability detected Muscle Tone/Strength: Functionally intact. No obvious neuro-muscular anomalies detected. Sensory (Neurological): Unimpaired Muscle strength & Tone: No palpable anomalies  Lumbar Spine Area Exam  Skin & Axial Inspection: Well  healed scar from previous spine surgery detected Alignment: Asymmetric Functional ROM: Decreased ROM, to the left Stability: No instability detected Muscle Tone/Strength: Functionally intact. No obvious neuro-muscular anomalies detected. Sensory (Neurological): Dermatomal pain pattern Palpation: Complains of area being tender to palpation Left Fist Percussion Test Provocative Tests: Lumbar Hyperextension and rotation test: Positive bilaterally for facet joint pain. Lumbar Lateral bending test: Positive ipsilateral radicular pain, on the left. Positive for left-sided foraminal stenosis. Patrick's Maneuver: evaluation deferred today                    Gait & Posture Assessment  Ambulation: Limited Gait: Antalgic Posture: Difficulty standing up straight, due to pain   Lower Extremity Exam    Side: Right lower extremity  Side: Left lower extremity  Skin & Extremity Inspection: Skin  color, temperature, and hair growth are WNL. No peripheral edema or cyanosis. No masses, redness, swelling, asymmetry, or associated skin lesions. No contractures.  Skin & Extremity Inspection: Skin color, temperature, and hair growth are WNL. No peripheral edema or cyanosis. No masses, redness, swelling, asymmetry, or associated skin lesions. No contractures.  Functional ROM: Unrestricted ROM          Functional ROM: Unrestricted ROM          Muscle Tone/Strength: Functionally intact. No obvious neuro-muscular anomalies detected.  Muscle Tone/Strength: Functionally intact. No obvious neuro-muscular anomalies detected.  Sensory (Neurological): Unimpaired  Sensory (Neurological): Dermatomal pain pattern  Palpation: No palpable anomalies  Palpation: No palpable anomalies   Assessment  Primary Diagnosis & Pertinent Problem List: The primary encounter diagnosis was Lumbar radiculopathy  (LEFT L5, S1). Diagnoses of Post laminectomy syndrome, Lumbar degenerative disc disease, Lumbar foraminal stenosis, and Chronic pain syndrome were also pertinent to this visit.  Visit Diagnosis (New problems to examiner): 1. Lumbar radiculopathy  (LEFT L5, S1)   2. Post laminectomy syndrome   3. Lumbar degenerative disc disease   4. Lumbar foraminal stenosis   5. Chronic pain syndrome    Plan of Care (Initial workup plan)  Note: Please be advised that as per protocol, today's visit has been an evaluation only. We have not taken over the patient's controlled substance management.  General Recommendations: The pain condition that the patient suffers from is best treated with a multidisciplinary approach that involves an increase in physical activity to prevent de-conditioning and worsening of the pain cycle, as well as psychological counseling (formal and/or informal) to address the co-morbid psychological affects of pain. Treatment will often involve judicious use of pain medications and interventional procedures to  decrease the pain, allowing the patient to participate in the physical activity that will ultimately produce long-lasting pain reductions. The goal of the multidisciplinary approach is to return the patient to a higher level of overall function and to restore their ability to perform activities of daily living.  77 year old female with a chief complaint of axial low back pain that radiates down into her left leg, calf, left ankle and a posterior lateral distribution.  Of note patient had a fall in July 2017 in a car port.  Patient went on to have lumbar spine surgery September 20, 2017 which was a L5/S1 laminectomy/decompression microdiscectomy with Dr. Cari Caraway.  Patient states that the surgery helped but she continues to have persistent pain in her left posterior lateral thigh and lateral leg.  She finds this pain very debilitating.  Patient enjoys doing outdoor activities and states that she is limited given her pain.  She does walk with a  compensatory, antalgic gait.  Patient has not tried any epidural steroid injections since her surgery.  She has tried epidural steroid injections prior to her surgery.  Patient does participate with physical therapy 3 times a week.  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  She is referred here for consideration of lumbar ESI and or spinal cord stimulation.  Today we discussed the cause of her persistent left radicular pain and treatment options.  Since the patient has not tried any epidural steroid injections since her surgery, we discussed lumbar epidural steroid injection to help with her left radicular symptoms.  I also discussed with her the risks and benefits of spinal cord stimulation.  I provided the patient with resources that she can reference at home.  If the epidural steroid injection is not effective in relieving her left radicular symptoms, I will send the patient for  psychological evaluation for spinal cord stimulation and start workup for spinal cord stimulator trial.  Plan: -Left lumbar ESI for left L5-S1 radiculopathy -Resources provided for spinal cord stimulation if epidural steroid injections are not helpful  Future considerations: Spinal cord stimulator trial  Ordered Lab-work, Procedure(s), Referral(s), & Consult(s): Orders Placed This Encounter  Procedures  . Lumbar Epidural Injection    Provider-requested follow-up: Return in about 3 weeks (around 04/02/2018) for Procedure.  Future Appointments  Date Time Provider [place]  04/09/2018 10:20 AM [facility]  11/19/2018  9:15 AM Nicholas Lose, MD [facility] None    Primary Care Physician: Rusty Aus, MD Location: [facility] Note by: Gillis Santa, M.D, Date: 03/15/2018; Time: 1:06 PM  Patient Instructions  ____________________________________________________________________________________________  General Risks and Possible Complications  Patient Responsibilities: It is important that you read this as it is part of your informed consent. It is our duty to inform you of the risks and possible complications associated with treatments offered to you. It is your responsibility as a patient to read this and to ask questions about anything that is not clear or that you believe was not covered in this document.  Patient's Rights: You have the right to refuse treatment. You also have the right to change your mind, even after initially having agreed to have the treatment done. However, under this last option, if you wait until the last second to change your mind, you may be charged for the materials used up to that point.  Introduction: Medicine is not an Chief Strategy Officer. Everything in Medicine, including the lack of treatment(s), carries the potential for danger, harm, or loss (which is by definition: Risk). In Medicine, a  complication is a secondary problem, condition, or disease that can aggravate an already existing one. All treatments carry the risk of possible complications. The fact that a side effects or complications occurs, does not imply that the treatment was conducted incorrectly. It must be clearly understood that these can happen even when everything is done following the highest safety standards.  No treatment: You can choose not to proceed with the proposed treatment alternative. The "PRO(s)" would include: avoiding the risk of complications associated with the therapy. The "CON(s)" would include: not getting any of the treatment benefits. These benefits fall under one of three categories: diagnostic; therapeutic; and/or palliative. Diagnostic benefits include: getting information which can ultimately lead to improvement of the disease or symptom(s). Therapeutic benefits are those associated with the successful treatment of the disease. Finally, palliative benefits are those related  to the decrease of the primary symptoms, without necessarily curing the condition (example: decreasing the pain from a flare-up of a chronic condition, such as incurable terminal cancer).  General Risks and Complications: These are associated to most interventional treatments. They can occur alone, or in combination. They fall under one of the following six (6) categories: no benefit or worsening of symptoms; bleeding; infection; nerve damage; allergic reactions; and/or death. 1. No benefits or worsening of symptoms: In Medicine there are no guarantees, only probabilities. No healthcare provider can ever guarantee that a medical treatment will work, they can only state the probability that it may. Furthermore, there is always the possibility that the condition may worsen, either directly, or indirectly, as a consequence of the treatment. 2. Bleeding: This is more common if the patient is taking a blood thinner, either prescription or  over the counter (example: Goody Powders, Fish oil, Aspirin, Garlic, etc.), or if suffering a condition associated with impaired coagulation (example: Hemophilia, cirrhosis of the liver, low platelet counts, etc.). However, even if you do not have one on these, it can still happen. If you have any of these conditions, or take one of these drugs, make sure to notify your treating physician. 3. Infection: This is more common in patients with a compromised immune system, either due to disease (example: diabetes, cancer, human immunodeficiency virus [HIV], etc.), or due to medications or treatments (example: therapies used to treat cancer and rheumatological diseases). However, even if you do not have one on these, it can still happen. If you have any of these conditions, or take one of these drugs, make sure to notify your treating physician. 4. Nerve Damage: This is more common when the treatment is an invasive one, but it can also happen with the use of medications, such as those used in the treatment of cancer. The damage can occur to small secondary nerves, or to large primary ones, such as those in the spinal cord and brain. This damage may be temporary or permanent and it may lead to impairments that can range from temporary numbness to permanent paralysis and/or brain death. 5. Allergic Reactions: Any time a substance or material comes in contact with our body, there is the possibility of an allergic reaction. These can range from a mild skin rash (contact dermatitis) to a severe systemic reaction (anaphylactic reaction), which can result in death. 6. Death: In general, any medical intervention can result in death, most of the time due to an unforeseen complication. ____________________________________________________________________________________________  Epidural Steroid Injection Patient Information  Description: The epidural space surrounds the nerves as they exit the spinal cord.  In some  patients, the nerves can be compressed and inflamed by a bulging disc or a tight spinal canal (spinal stenosis).  By injecting steroids into the epidural space, we can bring irritated nerves into direct contact with a potentially helpful medication.  These steroids act directly on the irritated nerves and can reduce swelling and inflammation which often leads to decreased pain.  Epidural steroids may be injected anywhere along the spine and from the neck to the low back depending upon the location of your pain.   After numbing the skin with local anesthetic (like Novocaine), a small needle is passed into the epidural space slowly.  You may experience a sensation of pressure while this is being done.  The entire block usually last less than 10 minutes.  Conditions which may be treated by epidural steroids:   Low back and leg pain  Neck and arm pain  Spinal stenosis  Post-laminectomy syndrome  Herpes zoster (shingles) pain  Pain from compression fractures  Preparation for the injection:  1. Do not eat any solid food or dairy products within 8 hours of your appointment.  2. You may drink clear liquids up to 3 hours before appointment.  Clear liquids include water, black coffee, juice or soda.  No milk or cream please. 3. You may take your regular medication, including pain medications, with a sip of water before your appointment  Diabetics should hold regular insulin (if taken separately) and take 1/2 normal NPH dos the morning of the procedure.  Carry some sugar containing items with you to your appointment. 4. A driver must accompany you and be prepared to drive you home after your procedure.  5. Bring all your current medications with your. 6. An IV may be inserted and sedation may be given at the discretion of the physician.   7. A blood pressure cuff, EKG and other monitors will often be applied during the procedure.  Some patients may need to have extra oxygen administered for a short  period. 8. You will be asked to provide medical information, including your allergies, prior to the procedure.  We must know immediately if you are taking blood thinners (like Coumadin/Warfarin)  Or if you are allergic to IV iodine contrast (dye). We must know if you could possible be pregnant.  Possible side-effects:  Bleeding from needle site  Infection (rare, may require surgery)  Nerve injury (rare)  Numbness & tingling (temporary)  Difficulty urinating (rare, temporary)  Spinal headache ( a headache worse with upright posture)  Light -headedness (temporary)  Pain at injection site (several days)  Decreased blood pressure (temporary)  Weakness in arm/leg (temporary)  Pressure sensation in back/neck (temporary)  Call if you experience:  Fever/chills associated with headache or increased back/neck pain.  Headache worsened by an upright position.  New onset weakness or numbness of an extremity below the injection site  Hives or difficulty breathing (go to the emergency room)  Inflammation or drainage at the infection site  Severe back/neck pain  Any new symptoms which are concerning to you  Please note:  Although the local anesthetic injected can often make your back or neck feel good for several hours after the injection, the pain will likely return.  It takes 3-7 days for steroids to work in the epidural space.  You may not notice any pain relief for at least that one week.  If effective, we will often do a series of three injections spaced 3-6 weeks apart to maximally decrease your pain.  After the initial series, we generally will wait several months before considering a repeat injection of the same type.  If you have any questions, please call [phone] [facility]

## 2018-03-15 NOTE — Progress Notes (Signed)
Safety precautions to be maintained throughout the outpatient stay will include: orient to surroundings, keep bed in low position, maintain call bell within reach at all times, provide assistance with transfer out of bed and ambulation.  

## 2018-03-27 ENCOUNTER — Telehealth: Payer: Self-pay | Admitting: Student in an Organized Health Care Education/Training Program

## 2018-03-27 NOTE — Telephone Encounter (Signed)
Patient wants to have sedation when she comes on 04-02-18 for procedure. Wanted to make sure Dr. Holley Raring knows

## 2018-03-28 ENCOUNTER — Encounter: Payer: Self-pay | Admitting: Hematology and Oncology

## 2018-04-02 ENCOUNTER — Ambulatory Visit (HOSPITAL_BASED_OUTPATIENT_CLINIC_OR_DEPARTMENT_OTHER): Payer: Medicare HMO | Admitting: Student in an Organized Health Care Education/Training Program

## 2018-04-02 ENCOUNTER — Encounter: Payer: Self-pay | Admitting: Student in an Organized Health Care Education/Training Program

## 2018-04-02 ENCOUNTER — Other Ambulatory Visit: Payer: Self-pay

## 2018-04-02 ENCOUNTER — Ambulatory Visit
Admission: RE | Admit: 2018-04-02 | Discharge: 2018-04-02 | Disposition: A | Discharge status: Home / Self Care | Payer: Medicare HMO | Source: Ambulatory Visit | Attending: Student in an Organized Health Care Education/Training Program | Admitting: Student in an Organized Health Care Education/Training Program

## 2018-04-02 VITALS — BP 134/71 | HR 81 | Temp 98.0°F | Resp 16 | Ht 64.0 in | Wt 173.0 lb

## 2018-04-02 DIAGNOSIS — Z885 Allergy status to narcotic agent status: Secondary | ICD-10-CM | POA: Diagnosis not present

## 2018-04-02 DIAGNOSIS — M48061 Spinal stenosis, lumbar region without neurogenic claudication: Secondary | ICD-10-CM | POA: Insufficient documentation

## 2018-04-02 DIAGNOSIS — M9983 Other biomechanical lesions of lumbar region: Secondary | ICD-10-CM

## 2018-04-02 DIAGNOSIS — Z7982 Long term (current) use of aspirin: Secondary | ICD-10-CM | POA: Insufficient documentation

## 2018-04-02 DIAGNOSIS — F419 Anxiety disorder, unspecified: Secondary | ICD-10-CM | POA: Insufficient documentation

## 2018-04-02 DIAGNOSIS — Z9851 Tubal ligation status: Secondary | ICD-10-CM | POA: Insufficient documentation

## 2018-04-02 DIAGNOSIS — Z91013 Allergy to seafood: Secondary | ICD-10-CM | POA: Insufficient documentation

## 2018-04-02 DIAGNOSIS — M5416 Radiculopathy, lumbar region: Secondary | ICD-10-CM | POA: Insufficient documentation

## 2018-04-02 DIAGNOSIS — Z90710 Acquired absence of both cervix and uterus: Secondary | ICD-10-CM | POA: Insufficient documentation

## 2018-04-02 DIAGNOSIS — Z79811 Long term (current) use of aromatase inhibitors: Secondary | ICD-10-CM | POA: Diagnosis not present

## 2018-04-02 DIAGNOSIS — Z880 Allergy status to penicillin: Secondary | ICD-10-CM | POA: Insufficient documentation

## 2018-04-02 DIAGNOSIS — Z888 Allergy status to other drugs, medicaments and biological substances status: Secondary | ICD-10-CM | POA: Insufficient documentation

## 2018-04-02 DIAGNOSIS — M79605 Pain in left leg: Secondary | ICD-10-CM | POA: Diagnosis present

## 2018-04-02 DIAGNOSIS — Z79899 Other long term (current) drug therapy: Secondary | ICD-10-CM | POA: Diagnosis not present

## 2018-04-02 MED ORDER — DEXAMETHASONE SODIUM PHOSPHATE 10 MG/ML IJ SOLN
10.0000 mg | Freq: Once | INTRAMUSCULAR | Status: AC
  Administered 2018-04-02: 10 mg
  Filled 2018-04-02: qty 1

## 2018-04-02 MED ORDER — FENTANYL CITRATE (PF) 100 MCG/2ML IJ SOLN
25.0000 ug | INTRAMUSCULAR | Status: DC | PRN
  Administered 2018-04-02: 25 ug via INTRAVENOUS
  Filled 2018-04-02: qty 2

## 2018-04-02 MED ORDER — SODIUM CHLORIDE 0.9% FLUSH
2.0000 mL | Freq: Once | INTRAVENOUS | Status: AC
  Administered 2018-04-02: 2 mL

## 2018-04-02 MED ORDER — ROPIVACAINE HCL 2 MG/ML IJ SOLN
2.0000 mL | Freq: Once | INTRAMUSCULAR | Status: AC
  Administered 2018-04-02: 2 mL via EPIDURAL
  Filled 2018-04-02: qty 10

## 2018-04-02 MED ORDER — LIDOCAINE HCL (PF) 1 % IJ SOLN
4.5000 mL | Freq: Once | INTRAMUSCULAR | Status: AC
  Administered 2018-04-02: 4.5 mL
  Filled 2018-04-02: qty 5

## 2018-04-02 MED ORDER — IOPAMIDOL (ISOVUE-M 200) INJECTION 41%
10.0000 mL | Freq: Once | INTRAMUSCULAR | Status: AC
  Administered 2018-04-02: 10 mL via EPIDURAL
  Filled 2018-04-02: qty 10

## 2018-04-02 MED ORDER — LACTATED RINGERS IV SOLN
1000.0000 mL | Freq: Once | INTRAVENOUS | Status: AC
  Administered 2018-04-02: 1000 mL via INTRAVENOUS

## 2018-04-02 NOTE — Patient Instructions (Signed)

## 2018-04-02 NOTE — Progress Notes (Signed)
Safety precautions to be maintained throughout the outpatient stay will include: orient to surroundings, keep bed in low position, maintain call bell within reach at all times, provide assistance with transfer out of bed and ambulation.  

## 2018-04-02 NOTE — Progress Notes (Signed)
Patient's Name: Debra Kelly  MRN: [id number]  Referring Provider: Gillis Santa, MD  DOB: Sep 21, 1941  PCP: Rusty Aus, MD  DOS: 04/02/2018  Note by: Gillis Santa, MD  Service setting: Ambulatory outpatient  Specialty: Interventional Pain Management  Patient type: Established  Location: [facility] (AMB) Pain Management Facility  Visit type: Interventional Procedure   Primary Reason for Visit: Interventional Pain Management Treatment. CC: Leg Pain (left)  Procedure:       Anesthesia, Analgesia, Anxiolysis:  Type: Therapeutic Inter-Laminar Epidural Steroid Injection #1  Region: Lumbar Level: L4-5 Level. Laterality: Left-Sided         Type: Moderate (Conscious) Sedation combined with Local Anesthesia Indication(s): Analgesia and Anxiety Route: Intravenous (IV) IV Access: Secured Sedation: Meaningful verbal contact was maintained at all times during the procedure  Local Anesthetic: Lidocaine 1-2%   Indications: 1. Lumbar radiculopathy  (LEFT L5, S1)   2. Lumbar foraminal stenosis    Pain Score: Pre-procedure: 3 /10 Post-procedure: 1 /10  Pre-op Assessment:  Debra Kelly is a 77 y.o. (year old), female patient, seen today for interventional treatment. She  has a past surgical history that includes Tubal ligation (09-13-12); Appendectomy (09-13-12); Abdominal hysterectomy (09-13-12); Knee arthroscopy (09-13-12); Cystocele repair (09/18/2012); Vaginal prolapse repair (09/18/2012); Cystoscopy (09/18/2012); Rectocele repair (N/A, 10/15/2013); Breast lumpectomy with radioactive seed and sentinel lymph node biopsy (Right, 05/08/2015); Breast excisional biopsy (Right, 1993); Breast surgery (09-13-12); Breast lumpectomy (Right, 05/08/2015); Breast biopsy (Right, 04/16/2015); Breast biopsy (Left, 02/18/2014); and Lumbar laminectomy/decompression microdiscectomy (N/A, 09/20/2017). Debra Kelly has a current medication list which includes the following prescription(s): acetaminophen, alprazolam, anastrozole, aspirin ec,  baclofen, levothyroxine, losartan, meloxicam, multiple vitamins-minerals, oxybutynin, potassium, tramadol, vitamin b-12, vitamin c, baclofen, cholecalciferol, losartan, magnesium, and oxycodone, and the following Facility-Administered Medications: fentanyl and lactated ringers. Her primarily concern today is the Leg Pain (left)  Initial Vital Signs:  Pulse/HCG Rate: 91ECG Heart Rate: 78 Temp: 98.3 F (36.8 C) Resp: 18 BP: (!) 162/81 SpO2: 99 %  BMI: Estimated body mass index is 29.7 kg/m as calculated from the following:   Height as of this encounter: 5\' 4"  (1.626 m).   Weight as of this encounter: 173 lb (78.5 kg).  Risk Assessment: Allergies: Reviewed. She is allergic to shellfish allergy; lisinopril; radiaplexrx [skin protectants, misc.]; adhesive [tape]; amoxicillin-pot clavulanate; barbiturates; celecoxib; codeine; loratadine; and tapentadol.  Allergy Precautions: None required Coagulopathies: Reviewed. None identified.  Blood-thinner therapy: None at this time Active Infection(s): Reviewed. None identified. Debra Kelly is afebrile  Site Confirmation: Debra Kelly was asked to confirm the procedure and laterality before marking the site Procedure checklist: Completed Consent: Before the procedure and under the influence of no sedative(s), amnesic(s), or anxiolytics, the patient was informed of the treatment options, risks and possible complications. To fulfill our ethical and legal obligations, as recommended by the American Medical Association's Code of Ethics, I have informed the patient of my clinical impression; the nature and purpose of the treatment or procedure; the risks, benefits, and possible complications of the intervention; the alternatives, including doing nothing; the risk(s) and benefit(s) of the alternative treatment(s) or procedure(s); and the risk(s) and benefit(s) of doing nothing. The patient was provided information about the general risks and possible complications  associated with the procedure. These may include, but are not limited to: failure to achieve desired goals, infection, bleeding, organ or nerve damage, allergic reactions, paralysis, and death. In addition, the patient was informed of those risks and complications associated to Spine-related procedures, such as failure to decrease pain; infection (  i.e.: Meningitis, epidural or intraspinal abscess); bleeding (i.e.: epidural hematoma, subarachnoid hemorrhage, or any other type of intraspinal or peri-dural bleeding); organ or nerve damage (i.e.: Any type of peripheral nerve, nerve root, or spinal cord injury) with subsequent damage to sensory, motor, and/or autonomic systems, resulting in permanent pain, numbness, and/or weakness of one or several areas of the body; allergic reactions; (i.e.: anaphylactic reaction); and/or death. Furthermore, the patient was informed of those risks and complications associated with the medications. These include, but are not limited to: allergic reactions (i.e.: anaphylactic or anaphylactoid reaction(s)); adrenal axis suppression; blood sugar elevation that in diabetics may result in ketoacidosis or comma; water retention that in patients with history of congestive heart failure may result in shortness of breath, pulmonary edema, and decompensation with resultant heart failure; weight gain; swelling or edema; medication-induced neural toxicity; particulate matter embolism and blood vessel occlusion with resultant organ, and/or nervous system infarction; and/or aseptic necrosis of one or more joints. Finally, the patient was informed that Medicine is not an exact science; therefore, there is also the possibility of unforeseen or unpredictable risks and/or possible complications that may result in a catastrophic outcome. The patient indicated having understood very clearly. We have given the patient no guarantees and we have made no promises. Enough time was given to the patient to  ask questions, all of which were answered to the patient's satisfaction. Debra Kelly has indicated that she wanted to continue with the procedure. Attestation: I, the ordering provider, attest that I have discussed with the patient the benefits, risks, side-effects, alternatives, likelihood of achieving goals, and potential problems during recovery for the procedure that I have provided informed consent. Date  Time: 04/02/2018  9:14 AM  Pre-Procedure Preparation:  Monitoring: As per clinic protocol. Respiration, ETCO2, SpO2, BP, heart rate and rhythm monitor placed and checked for adequate function Safety Precautions: Patient was assessed for positional comfort and pressure points before starting the procedure. Time-out: I initiated and conducted the "Time-out" before starting the procedure, as per protocol. The patient was asked to participate by confirming the accuracy of the "Time Out" information. Verification of the correct person, site, and procedure were performed and confirmed by me, the nursing staff, and the patient. "Time-out" conducted as per Joint Commission's Universal Protocol (UP.01.01.01). Time: 1002  Description of Procedure:       Position: Prone with head of the table was raised to facilitate breathing. Target Area: The interlaminar space, initially targeting the lower laminar border of the superior vertebral body. Approach: Paramedial approach. Area Prepped: Entire Posterior Lumbar Region Prepping solution: ChloraPrep (2% chlorhexidine gluconate and 70% isopropyl alcohol) Safety Precautions: Aspiration looking for blood return was conducted prior to all injections. At no point did we inject any substances, as a needle was being advanced. No attempts were made at seeking any paresthesias. Safe injection practices and needle disposal techniques used. Medications properly checked for expiration dates. SDV (single dose vial) medications used. Description of the Procedure: Protocol  guidelines were followed. The procedure needle was introduced through the skin, ipsilateral to the reported pain, and advanced to the target area. Bone was contacted and the needle walked caudad, until the lamina was cleared. The epidural space was identified using "loss-of-resistance technique" with 2-3 ml of PF-NaCl (0.9% NSS), in a 5cc LOR glass syringe. Vitals:   04/02/18 1015 04/02/18 1025 04/02/18 1036 04/02/18 1044  BP: (!) 155/80 135/70 137/80 134/71  Pulse: 81     Resp: 14 15 18 16   Temp:  97.7 F (36.5 C)  98 F (36.7 C)  SpO2: 99% 98% (!) 82% (!) 89%  Weight:      Height:        Start Time: 1002 hrs. End Time: 1011 hrs. Materials:  Needle(s) Type: Epidural needle Gauge: 17G Length: 3.5-in Medication(s): Please see orders for medications and dosing details. 8 CC solution made a 5 cc of preservative-free saline, 2 cc of 0.2% ropivacaine, 1 cc of Decadron 10 mg/cc. Imaging Guidance (Spinal):  Type of Imaging Technique: Fluoroscopy Guidance (Spinal) Indication(s): Assistance in needle guidance and placement for procedures requiring needle placement in or near specific anatomical locations not easily accessible without such assistance. Exposure Time: Please see nurses notes. Contrast: Before injecting any contrast, we confirmed that the patient did not have an allergy to iodine, shellfish, or radiological contrast. Once satisfactory needle placement was completed at the desired level, radiological contrast was injected. Contrast injected under live fluoroscopy. No contrast complications. See chart for type and volume of contrast used. Fluoroscopic Guidance: I was personally present during the use of fluoroscopy. "Tunnel Vision Technique" used to obtain the best possible view of the target area. Parallax error corrected before commencing the procedure. "Direction-depth-direction" technique used to introduce the needle under continuous pulsed fluoroscopy. Once target was reached,  antero-posterior, oblique, and lateral fluoroscopic projection used confirm needle placement in all planes. Images permanently stored in EMR. Interpretation: I personally interpreted the imaging intraoperatively. Adequate needle placement confirmed in multiple planes. Appropriate spread of contrast into desired area was observed. No evidence of afferent or efferent intravascular uptake. No intrathecal or subarachnoid spread observed. Permanent images saved into the patient's record.  Antibiotic Prophylaxis:   Anti-infectives (From admission, onward)   None     Indication(s): None identified  Post-operative Assessment:  Post-procedure Vital Signs:  Pulse/HCG Rate: 8178 Temp: 98 F (36.7 C) Resp: 16 BP: 134/71 SpO2: (!) 89 %  EBL: None  Complications: No immediate post-treatment complications observed by team, or reported by patient.  Note: The patient tolerated the entire procedure well. A repeat set of vitals were taken after the procedure and the patient was kept under observation following institutional policy, for this type of procedure. Post-procedural neurological assessment was performed, showing return to baseline, prior to discharge. The patient was provided with post-procedure discharge instructions, including a section on how to identify potential problems. Should any problems arise concerning this procedure, the patient was given instructions to immediately contact us, at any time, without hesitation. In any case, we plan to contact the patient by telephone for a follow-up status report regarding this interventional procedure.  Comments:  No additional relevant information. 5 out of 5 strength bilateral lower extremity: Plantar flexion, dorsiflexion, knee flexion, knee extension.  Plan of Care   Imaging Orders     DG C-Arm 1-60 Min-No Report Procedure Orders    No procedure(s) ordered today    Medications ordered for procedure: Meds ordered this encounter   Medications  . lactated ringers infusion 1,000 mL  . fentaNYL (SUBLIMAZE) injection 25-100 mcg    Make sure Narcan is available in the pyxis when using this medication. In the event of respiratory depression (RR< 8/min): Titrate NARCAN (naloxone) in increments of 0.1 to 0.2 mg IV at 2-3 minute intervals, until desired degree of reversal.  . iopamidol (ISOVUE-M) 41 % intrathecal injection 10 mL  . ropivacaine (PF) 2 mg/mL (0.2%) (NAROPIN) injection 2 mL  . sodium chloride flush (NS) 0.9 % injection 2 mL  . dexamethasone (  DECADRON) injection 10 mg  . lidocaine (PF) (XYLOCAINE) 1 % injection 4.5 mL   Medications administered: We administered lactated ringers, fentaNYL, iopamidol, ropivacaine (PF) 2 mg/mL (0.2%), sodium chloride flush, dexamethasone, and lidocaine (PF).  See the medical record for exact dosing, route, and time of administration.  New Prescriptions   No medications on file   Disposition: Discharge home  Discharge Date & Time: 04/02/2018; 1047 hrs.   Physician-requested Follow-up: Return in about 2 weeks (around 04/16/2018) for Post Procedure Evaluation.  Future Appointments  Date Time Provider [place]  04/09/2018 10:20 AM [facility]  04/24/2018 10:15 AM Gillis Santa, MD [facility] None  11/19/2018  9:15 AM Nicholas Lose, MD [facility] None   Primary Care Physician: Rusty Aus, MD Location: [facility] Note by: Gillis Santa, MD Date: 04/02/2018; Time: 10:50 AM  Disclaimer:  Medicine is not an exact science. The only guarantee in medicine is that nothing is guaranteed. It is important to note that the decision to proceed with this intervention was based on the information collected from the patient. The Data and conclusions were drawn from the patient's questionnaire, the interview, and the physical examination. Because the information was provided in large part by the patient, it cannot be guaranteed  that it has not been purposely or unconsciously manipulated. Every effort has been made to obtain as much relevant data as possible for this evaluation. It is important to note that the conclusions that lead to this procedure are derived in large part from the available data. Always take into account that the treatment will also be dependent on availability of resources and existing treatment guidelines, considered by other Pain Management Practitioners as being common knowledge and practice, at the time of the intervention. For Medico-Legal purposes, it is also important to point out that variation in procedural techniques and pharmacological choices are the acceptable norm. The indications, contraindications, technique, and results of the above procedure should only be interpreted and judged by a Board-Certified Interventional Pain Specialist with extensive familiarity and expertise in the same exact procedure and technique.

## 2018-04-03 ENCOUNTER — Telehealth: Payer: Self-pay | Admitting: *Deleted

## 2018-04-03 NOTE — Telephone Encounter (Signed)
Attempted to call for post procedure follow-up. Message left. 

## 2018-04-04 ENCOUNTER — Ambulatory Visit: Payer: Medicare HMO | Admitting: Student in an Organized Health Care Education/Training Program

## 2018-04-09 ENCOUNTER — Ambulatory Visit
Admission: RE | Admit: 2018-04-09 | Discharge: 2018-04-09 | Disposition: A | Discharge status: Home / Self Care | Payer: Medicare HMO | Source: Ambulatory Visit | Attending: Hematology and Oncology | Admitting: Hematology and Oncology

## 2018-04-09 DIAGNOSIS — Z9889 Other specified postprocedural states: Secondary | ICD-10-CM

## 2018-04-09 HISTORY — DX: Personal history of irradiation: Z92.3

## 2018-04-19 ENCOUNTER — Ambulatory Visit: Payer: Medicare HMO | Admitting: Student in an Organized Health Care Education/Training Program

## 2018-04-24 ENCOUNTER — Other Ambulatory Visit: Payer: Self-pay

## 2018-04-24 ENCOUNTER — Ambulatory Visit
Payer: Medicare HMO | Attending: Student in an Organized Health Care Education/Training Program | Admitting: Student in an Organized Health Care Education/Training Program

## 2018-04-24 ENCOUNTER — Encounter: Payer: Self-pay | Admitting: Student in an Organized Health Care Education/Training Program

## 2018-04-24 VITALS — BP 121/68 | HR 74 | Temp 97.6°F | Resp 16 | Ht 64.0 in | Wt 172.0 lb

## 2018-04-24 DIAGNOSIS — M5116 Intervertebral disc disorders with radiculopathy, lumbar region: Secondary | ICD-10-CM | POA: Insufficient documentation

## 2018-04-24 DIAGNOSIS — G894 Chronic pain syndrome: Secondary | ICD-10-CM | POA: Insufficient documentation

## 2018-04-24 DIAGNOSIS — Z79899 Other long term (current) drug therapy: Secondary | ICD-10-CM | POA: Insufficient documentation

## 2018-04-24 DIAGNOSIS — M961 Postlaminectomy syndrome, not elsewhere classified: Secondary | ICD-10-CM | POA: Diagnosis not present

## 2018-04-24 DIAGNOSIS — Z923 Personal history of irradiation: Secondary | ICD-10-CM | POA: Diagnosis not present

## 2018-04-24 DIAGNOSIS — Z888 Allergy status to other drugs, medicaments and biological substances status: Secondary | ICD-10-CM | POA: Diagnosis not present

## 2018-04-24 DIAGNOSIS — I10 Essential (primary) hypertension: Secondary | ICD-10-CM | POA: Diagnosis not present

## 2018-04-24 DIAGNOSIS — Z79890 Hormone replacement therapy (postmenopausal): Secondary | ICD-10-CM | POA: Insufficient documentation

## 2018-04-24 DIAGNOSIS — M1711 Unilateral primary osteoarthritis, right knee: Secondary | ICD-10-CM | POA: Insufficient documentation

## 2018-04-24 DIAGNOSIS — M48061 Spinal stenosis, lumbar region without neurogenic claudication: Secondary | ICD-10-CM | POA: Insufficient documentation

## 2018-04-24 DIAGNOSIS — C50411 Malignant neoplasm of upper-outer quadrant of right female breast: Secondary | ICD-10-CM | POA: Diagnosis not present

## 2018-04-24 DIAGNOSIS — M5136 Other intervertebral disc degeneration, lumbar region: Secondary | ICD-10-CM | POA: Diagnosis not present

## 2018-04-24 DIAGNOSIS — M5416 Radiculopathy, lumbar region: Secondary | ICD-10-CM | POA: Diagnosis not present

## 2018-04-24 DIAGNOSIS — M25579 Pain in unspecified ankle and joints of unspecified foot: Secondary | ICD-10-CM | POA: Insufficient documentation

## 2018-04-24 DIAGNOSIS — Z7982 Long term (current) use of aspirin: Secondary | ICD-10-CM | POA: Diagnosis not present

## 2018-04-24 DIAGNOSIS — M9983 Other biomechanical lesions of lumbar region: Secondary | ICD-10-CM

## 2018-04-24 DIAGNOSIS — Z791 Long term (current) use of non-steroidal anti-inflammatories (NSAID): Secondary | ICD-10-CM | POA: Diagnosis not present

## 2018-04-24 DIAGNOSIS — E039 Hypothyroidism, unspecified: Secondary | ICD-10-CM | POA: Insufficient documentation

## 2018-04-24 NOTE — Progress Notes (Signed)
Patient's Name: Debra Kelly  MRN: [id number]  Referring Provider: Rusty Aus, MD  DOB: 1941/05/12  PCP: Rusty Aus, MD  DOS: 04/24/2018  Note by: Gillis Santa, MD  Service setting: Ambulatory outpatient  Specialty: Interventional Pain Management  Location: [facility] (AMB) Pain Management Facility    Patient type: Established   Primary Reason(s) for Visit: Encounter for post-procedure evaluation of chronic illness with mild to moderate exacerbation CC: Ankle Pain (lower leg and ankle)  HPI  Debra Kelly is a 77 y.o. year old, female patient, who comes today for a post-procedure evaluation. She has Breast cancer of upper-outer quadrant of right female breast ([place]); Dermatitis; Primary osteoarthritis of right knee; Lumbar radiculopathy  (LEFT L5, S1); Post laminectomy syndrome; Lumbar degenerative disc disease; Lumbar foraminal stenosis; and Chronic pain syndrome on their problem list. Her primarily concern today is the Ankle Pain (lower leg and ankle)  Pain Assessment: Location: Left Calf Radiating: left ankle, outside of leg Onset: More than a month ago Duration: Chronic pain Quality: Aching, Dull Severity: 2 /10 (subjective, self-reported pain score)  Note: Reported level is compatible with observation.                         When using our objective Pain Scale, levels between 6 and 10/10 are said to belong in an emergency room, as it progressively worsens from a 6/10, described as severely limiting, requiring emergency care not usually available at an outpatient pain management facility. At a 6/10 level, communication becomes difficult and requires great effort. Assistance to reach the emergency department may be required. Facial flushing and profuse sweating along with potentially dangerous increases in heart rate and blood pressure will be evident. Effect on ADL: no effect Timing: Intermittent Modifying factors: lying flat, ice or heat BP: 121/68  HR: 74  Debra Kelly comes in today for  post-procedure evaluation after the treatment done on 04/03/2018.  Further details on both, my assessment(s), as well as the proposed treatment plan, please see below.  Post-Procedure Assessment  04/02/2018 Procedure: Left L4/5 ESI Pre-procedure pain score:  3/10 Post-procedure pain score: 1/10         Influential Factors: BMI: 29.52 kg/m Intra-procedural challenges: None observed.         Assessment challenges: None detected.              Reported side-effects: None.        Post-procedural adverse reactions or complications: None reported         Sedation: Please see nurses note. When no sedatives are used, the analgesic levels obtained are directly associated to the effectiveness of the local anesthetics. However, when sedation is provided, the level of analgesia obtained during the initial 1 hour following the intervention, is believed to be the result of a combination of factors. These factors may include, but are not limited to: 1. The effectiveness of the local anesthetics used. 2. The effects of the analgesic(s) and/or anxiolytic(s) used. 3. The degree of discomfort experienced by the patient at the time of the procedure. 4. The patients ability and reliability in recalling and recording the events. 5. The presence and influence of possible secondary gains and/or psychosocial factors. Reported result: Relief experienced during the 1st hour after the procedure: 50 % (Ultra-Short Term Relief)            Interpretative annotation: Clinically appropriate result. Analgesia during this period is likely to be Local Anesthetic and/or IV Sedative (Analgesic/Anxiolytic)  related.          Effects of local anesthetic: The analgesic effects attained during this period are directly associated to the localized infiltration of local anesthetics and therefore cary significant diagnostic value as to the etiological location, or anatomical origin, of the pain. Expected duration of relief is directly  dependent on the pharmacodynamics of the local anesthetic used. Long-acting (4-6 hours) anesthetics used.  Reported result: Relief during the next 4 to 6 hour after the procedure: 50 % (Short-Term Relief)            Interpretative annotation: Clinically appropriate result. Analgesia during this period is likely to be Local Anesthetic-related.          Long-term benefit: Defined as the period of time past the expected duration of local anesthetics (1 hour for short-acting and 4-6 hours for long-acting). With the possible exception of prolonged sympathetic blockade from the local anesthetics, benefits during this period are typically attributed to, or associated with, other factors such as analgesic sensory neuropraxia, antiinflammatory effects, or beneficial biochemical changes provided by agents other than the local anesthetics.  Reported result: Extended relief following procedure: 80 % (Long-Term Relief)            Interpretative annotation: Clinically appropriate result. Good relief. Therapeutic success. Inflammation plays a part in the etiology to the pain.          Current benefits: Defined as reported results that persistent at this point in time.   Analgesia: 75-100 %            Function: Somewhat improved ROM: Somewhat improved Interpretative annotation: Ongoing benefit. Therapeutic benefit observed. Effective diagnostic intervention.          Interpretation: Results would suggest a successful diagnostic and therapeutic intervention.                  Plan:  Repeat treatment or therapy and compare extent and duration of benefits.                Laboratory Chemistry  Inflammation Markers (CRP: Acute Phase) (ESR: Chronic Phase) No results found for: CRP, ESRSEDRATE, LATICACIDVEN                       Rheumatology Markers No results found for: RF, ANA, LABURIC, URICUR, LYMEIGGIGMAB, LYMEABIGMQN                      Renal Function Markers Lab Results  Component Value Date   BUN 23 (H)  09/14/2017   CREATININE 0.72 09/14/2017   GFRAA >60 09/14/2017   GFRNONAA >60 09/14/2017                              Hepatic Function Markers Lab Results  Component Value Date   AST 13 04/22/2015   ALT 13 04/22/2015   ALBUMIN 3.6 04/22/2015   ALKPHOS 67 04/22/2015                        Electrolytes Lab Results  Component Value Date   NA 140 09/14/2017   K 4.0 09/14/2017   CL 103 09/14/2017   CALCIUM 9.4 09/14/2017                        Neuropathy Markers No results found for: VITAMINB12, FOLATE, HGBA1C, HIV  Bone Pathology Markers No results found for: Marveen Reeks, [id number], [id number], 25OHVITD1, 25OHVITD2, 25OHVITD3, TESTOFREE, TESTOSTERONE                       Coagulation Parameters Lab Results  Component Value Date   INR 1.03 09/14/2017   LABPROT 13.4 09/14/2017   APTT 35 09/14/2017   PLT 250 09/14/2017                        Cardiovascular Markers Lab Results  Component Value Date   HGB 12.6 09/14/2017   HCT 37.0 09/14/2017                         CA Markers No results found for: CEA, CA125, LABCA2                      Note: Lab results reviewed.  Recent Diagnostic Imaging Results  MM DIAG BREAST TOMO BILATERAL CLINICAL DATA:  77 year old female for annual bilateral mammograms. History of RIGHT breast cancer and lumpectomy in 2016.  EXAM: DIGITAL DIAGNOSTIC BILATERAL MAMMOGRAM WITH CAD AND TOMO  COMPARISON:  Previous exam(s).  ACR Breast Density Category b: There are scattered areas of fibroglandular density.  FINDINGS: 2D and 3D full field views of both breasts and a magnification view of the lumpectomy site demonstrate no suspicious mass, nonsurgical distortion or worrisome calcifications.  RIGHT breast lumpectomy changes are again noted.  Mammographic images were processed with CAD.  IMPRESSION: No mammographic evidence of breast malignancy.  RECOMMENDATION: Bilateral diagnostic mammograms in 1  year.  I have discussed the findings and recommendations with the patient. Results were also provided in writing at the conclusion of the visit. If applicable, a reminder letter will be sent to the patient regarding the next appointment.  BI-RADS CATEGORY  2: Benign.  Electronically Signed   By: Margarette Canada M.D.   On: 04/09/2018 10:46  Complexity Note: Imaging results reviewed. Results shared with Debra Kelly, using Layman's terms.                         Meds   Current Outpatient Medications:  .  acetaminophen (TYLENOL) 500 MG tablet, Take 1,000 mg by mouth every 6 (six) hours as needed for moderate pain or headache., Disp: , Rfl:  .  ALPRAZolam (XANAX) 0.5 MG tablet, Take 0.5 tablets (0.25 mg total) by mouth 2 (two) times daily as needed for anxiety. (Patient taking differently: Take 0.5 mg by mouth at bedtime. ), Disp: 60 tablet, Rfl: 0 .  anastrozole (ARIMIDEX) 1 MG tablet, Take 1 tablet (1 mg total) by mouth daily., Disp: 90 tablet, Rfl: 3 .  Ascorbic Acid (VITAMIN C) 1000 MG tablet, Take 1,000 mg by mouth daily., Disp: , Rfl:  .  aspirin EC 81 MG tablet, Take by mouth., Disp: , Rfl:  .  baclofen (LIORESAL) 10 MG tablet, Take 1 tablet (10 mg total) by mouth 3 (three) times daily., Disp: 30 each, Rfl: 0 .  Cholecalciferol (VITAMIN D PO), Take 600 mg by mouth daily., Disp: , Rfl:  .  levothyroxine (SYNTHROID, LEVOTHROID) 175 MCG tablet, Take 175 mcg by mouth daily before breakfast., Disp: , Rfl:  .  losartan (COZAAR) 50 MG tablet, Take 1 tablet (50 mg total) by mouth daily., Disp: , Rfl:  .  meloxicam (MOBIC) 15 MG tablet, Take 15 mg by mouth daily., Disp: ,  Rfl:  .  Multiple Vitamins-Minerals (MULTIVITAMIN ADULT PO), Take 1 tablet by mouth daily., Disp: , Rfl:  .  oxybutynin (DITROPAN) 5 MG tablet, Take 5 mg by mouth daily. , Disp: , Rfl:  .  Potassium 99 MG TABS, Take 1 tablet (99 mg total) by mouth 2 (two) times daily. (Patient taking differently: Take 99 mg by mouth 2 (two) times  daily. ), Disp: 330 each, Rfl: 0 .  traMADol (ULTRAM) 50 MG tablet, TAKE 1 TABLET (50 MG TOTAL) BY MOUTH 2 (TWO) TIMES DAILY AS NEEDED FOR PAIN, Disp: , Rfl: 1 .  vitamin B-12 (CYANOCOBALAMIN) 500 MCG tablet, Take 500 mcg by mouth daily., Disp: , Rfl:  .  baclofen (LIORESAL) 10 MG tablet, TAKE 1 TABLET BY MOUTH 3 TIMES DAILY, Disp: , Rfl:  .  hydrochlorothiazide (HYDRODIURIL) 25 MG tablet, Take 25 mg by mouth as needed., Disp: , Rfl: 11 .  losartan (COZAAR) 50 MG tablet, TAKE 1 TABLET BY MOUTH EVERY DAY, Disp: , Rfl:  .  Magnesium 500 MG CAPS, Take 250 mg by mouth daily. , Disp: , Rfl:  .  oxyCODONE (ROXICODONE) 5 MG immediate release tablet, Take 1 tablet (5 mg total) by mouth every 4 (four) hours as needed for breakthrough pain. (Patient not taking: Reported on 03/15/2018), Disp: 30 tablet, Rfl: 0  ROS  Constitutional: Denies any fever or chills Gastrointestinal: No reported hemesis, hematochezia, vomiting, or acute GI distress Musculoskeletal: Denies any acute onset joint swelling, redness, loss of ROM, or weakness Neurological: No reported episodes of acute onset apraxia, aphasia, dysarthria, agnosia, amnesia, paralysis, loss of coordination, or loss of consciousness  Allergies  Debra Kelly is allergic to shellfish allergy; lisinopril; radiaplexrx [skin protectants, misc.]; adhesive [tape]; amoxicillin-pot clavulanate; barbiturates; celecoxib; codeine; loratadine; and tapentadol.  PFSH  Drug: Debra Kelly  reports that she does not use drugs. Alcohol:  reports that she drinks about 0.6 oz of alcohol per week. Tobacco:  reports that she has never smoked. She has never used smokeless tobacco. Medical:  has a past medical history of Allergy, Arthritis (09-13-12), Breast cancer ([place]) (04/16/15), Breast cancer of upper-outer quadrant of right female breast ([place]) (04/20/2015), Hypertension, Hypothyroidism (09-13-12), Personal history of radiation therapy, and Radiation (07/01/15-08/04/15). Surgical: Debra Kelly   has a past surgical history that includes Tubal ligation (09-13-12); Appendectomy (09-13-12); Abdominal hysterectomy (09-13-12); Knee arthroscopy (09-13-12); Cystocele repair (09/18/2012); Vaginal prolapse repair (09/18/2012); Cystoscopy (09/18/2012); Rectocele repair (N/A, 10/15/2013); Breast lumpectomy with radioactive seed and sentinel lymph node biopsy (Right, 05/08/2015); Breast excisional biopsy (Right, 1993); Breast surgery (09-13-12); Breast biopsy (Right, 04/16/2015); Breast biopsy (Left, 02/18/2014); Lumbar laminectomy/decompression microdiscectomy (N/A, 09/20/2017); and Breast lumpectomy (Right, 05/08/2015). Family: family history includes Prostate cancer in her brother and brother.  Constitutional Exam  General appearance: Well nourished, well developed, and well hydrated. In no apparent acute distress Vitals:   04/24/18 1019  BP: 121/68  Pulse: 74  Resp: 16  Temp: 97.6 F (36.4 C)  TempSrc: Oral  SpO2: 99%  Weight: 172 lb (78 kg)  Height: 5' 4"  (1.626 m)   BMI Assessment: Estimated body mass index is 29.52 kg/m as calculated from the following:   Height as of this encounter: 5' 4"  (1.626 m).   Weight as of this encounter: 172 lb (78 kg).  BMI interpretation table: BMI level Category Range association with higher incidence of chronic pain  <18 kg/m2 Underweight   18.5-24.9 kg/m2 Ideal body weight   25-29.9 kg/m2 Overweight Increased incidence by 20%  30-34.9 kg/m2  Obese (Class I) Increased incidence by 68%  35-39.9 kg/m2 Severe obesity (Class II) Increased incidence by 136%  >40 kg/m2 Extreme obesity (Class III) Increased incidence by 254%   Patient's current BMI Ideal Body weight  Body mass index is 29.52 kg/m. Ideal body weight: 54.7 kg (120 lb 9.5 oz) Adjusted ideal body weight: 64 kg (141 lb 2.5 oz)   BMI Readings from Last 4 Encounters:  04/24/18 29.52 kg/m  04/02/18 29.70 kg/m  03/15/18 31.64 kg/m  11/17/17 33.11 kg/m   Wt Readings from Last 4 Encounters:   04/24/18 172 lb (78 kg)  04/02/18 173 lb (78.5 kg)  03/15/18 173 lb (78.5 kg)  11/17/17 181 lb (82.1 kg)  Psych/Mental status: Alert, oriented x 3 (person, place, & time)       Eyes: PERLA Respiratory: No evidence of acute respiratory distress  Cervical Spine Area Exam  Skin & Axial Inspection: No masses, redness, edema, swelling, or associated skin lesions Alignment: Symmetrical Functional ROM: Unrestricted ROM      Stability: No instability detected Muscle Tone/Strength: Functionally intact. No obvious neuro-muscular anomalies detected. Sensory (Neurological): Unimpaired Palpation: No palpable anomalies              Upper Extremity (UE) Exam    Side: Right upper extremity  Side: Left upper extremity  Skin & Extremity Inspection: Skin color, temperature, and hair growth are WNL. No peripheral edema or cyanosis. No masses, redness, swelling, asymmetry, or associated skin lesions. No contractures.  Skin & Extremity Inspection: Skin color, temperature, and hair growth are WNL. No peripheral edema or cyanosis. No masses, redness, swelling, asymmetry, or associated skin lesions. No contractures.  Functional ROM: Unrestricted ROM          Functional ROM: Unrestricted ROM          Muscle Tone/Strength: Functionally intact. No obvious neuro-muscular anomalies detected.  Muscle Tone/Strength: Functionally intact. No obvious neuro-muscular anomalies detected.  Sensory (Neurological): Unimpaired          Sensory (Neurological): Unimpaired          Palpation: No palpable anomalies              Palpation: No palpable anomalies              Specialized Test(s): Deferred         Specialized Test(s): Deferred          Thoracic Spine Area Exam  Skin & Axial Inspection: No masses, redness, or swelling Alignment: Symmetrical Functional ROM: Unrestricted ROM Stability: No instability detected Muscle Tone/Strength: Functionally intact. No obvious neuro-muscular anomalies detected. Sensory  (Neurological): Unimpaired Muscle strength & Tone: No palpable anomalies  Lumbar Spine Area Exam  Skin & Axial Inspection: No masses, redness, or swelling Alignment: Symmetrical Functional ROM: Improved after treatment       Stability: No instability detected Muscle Tone/Strength: Functionally intact. No obvious neuro-muscular anomalies detected. Sensory (Neurological): Improved Palpation: No palpable anomalies       Provocative Tests: Lumbar Hyperextension and rotation test: evaluation deferred today       Lumbar Lateral bending test: Positive ipsilateral radicular pain, on the left. Positive for left-sided foraminal stenosis. Patrick's Maneuver: evaluation deferred today                    Gait & Posture Assessment  Ambulation: Patient ambulates using a cane Gait: Limited. Using assistive device to ambulate Posture: Difficulty standing up straight, due to pain   Lower Extremity Exam    Side:  Right lower extremity  Side: Left lower extremity  Stability: No instability observed          Stability: No instability observed          Skin & Extremity Inspection: Skin color, temperature, and hair growth are WNL. No peripheral edema or cyanosis. No masses, redness, swelling, asymmetry, or associated skin lesions. No contractures.  Skin & Extremity Inspection: Skin color, temperature, and hair growth are WNL. No peripheral edema or cyanosis. No masses, redness, swelling, asymmetry, or associated skin lesions. No contractures.  Functional ROM: Unrestricted ROM                  Functional ROM: Unrestricted ROM                  Muscle Tone/Strength: Functionally intact. No obvious neuro-muscular anomalies detected.  Muscle Tone/Strength: Functionally intact. No obvious neuro-muscular anomalies detected.  Sensory (Neurological): Unimpaired  Sensory (Neurological): Unimpaired  Palpation: No palpable anomalies  Palpation: No palpable anomalies   Assessment  Primary Diagnosis & Pertinent Problem  List: The primary encounter diagnosis was Lumbar radiculopathy  (LEFT L5, S1). Diagnoses of Lumbar foraminal stenosis, Post laminectomy syndrome, and Lumbar degenerative disc disease were also pertinent to this visit.  Status Diagnosis  Improving Persistent Controlled 1. Lumbar radiculopathy  (LEFT L5, S1)   2. Lumbar foraminal stenosis   3. Post laminectomy syndrome   4. Lumbar degenerative disc disease     General Recommendations: The pain condition that the patient suffers from is best treated with a multidisciplinary approach that involves an increase in physical activity to prevent de-conditioning and worsening of the pain cycle, as well as psychological counseling (formal and/or informal) to address the co-morbid psychological affects of pain. Treatment will often involve judicious use of pain medications and interventional procedures to decrease the pain, allowing the patient to participate in the physical activity that will ultimately produce long-lasting pain reductions. The goal of the multidisciplinary approach is to return the patient to a higher level of overall function and to restore their ability to perform activities of daily living.  77 year old female with a history of L5-S1 decompression (September 20, 2017 which was a L5/S1 laminectomy/decompression microdiscectomy with Dr. Cari Caraway) who presents with left lumbar radiculopathy most pronounced at left L5-S1 status post left L4-L5 lumbar epidural steroid injection who returns for follow-up.  Patient endorses significant pain relief and improvement in her functional status after her epidural steroid injection.  She states that she was able to drive to [place] to attend a graduation of a family member.  She also notes improvement in her gait and she states that she is limping less.  She states that she is less reliant on her cane as well to walk.  She is very pleased with the results.  She is interested in repeating the injection.   We will schedule her for lumbar ESI #2 at left L4-L5.  Plan: -Left L4-L5 ESI #2 with sedation   Lab-work, procedure(s), and/or referral(s): Orders Placed This Encounter  Procedures  . Lumbar Epidural Injection    Provider-requested follow-up: Return in about 4 weeks (around 05/21/2018).  Future Appointments  Date Time Provider [place]  11/19/2018  9:15 AM Nicholas Lose, MD [facility] None   Time Note: Greater than 50% of the 25 minute(s) of face-to-face time spent with Debra Kelly, was spent in counseling/coordination of care regarding: Debra Kelly's primary cause of pain, the treatment plan, treatment alternatives, the risks and possible complications of proposed  treatment, going over the informed consent, the results, interpretation and significance of  her recent diagnostic interventional treatment(s), realistic expectations and the goals of pain management (increased in functionality).  Primary Care Physician: Rusty Aus, MD Location: [facility] Out[facility] Management [facility] Note by: Gillis Santa, M.D Date: 04/24/2018; Time: 12:53 PM  Patient Instructions  Epidural Steroid Injection Patient Information  Description: The epidural space surrounds the nerves as they exit the spinal cord.  In some patients, the nerves can be compressed and inflamed by a bulging disc or a tight spinal canal (spinal stenosis).  By injecting steroids into the epidural space, we can bring irritated nerves into direct contact with a potentially helpful medication.  These steroids act directly on the irritated nerves and can reduce swelling and inflammation which often leads to decreased pain.  Epidural steroids may be injected anywhere along the spine and from the neck to the low back depending upon the location of your pain.   After numbing the skin with local anesthetic (like Novocaine), a small needle is passed into the epidural space slowly.  You may experience a sensation of pressure while this  is being done.  The entire block usually last less than 10 minutes.  Conditions which may be treated by epidural steroids:   Low back and leg pain  Neck and arm pain  Spinal stenosis  Post-laminectomy syndrome  Herpes zoster (shingles) pain  Pain from compression fractures  Preparation for the injection:  1. Do not eat any solid food or dairy products within 8 hours of your appointment.  2. You may drink clear liquids up to 3 hours before appointment.  Clear liquids include water, black coffee, juice or soda.  No milk or cream please. 3. You may take your regular medication, including pain medications, with a sip of water before your appointment  Diabetics should hold regular insulin (if taken separately) and take 1/2 normal NPH dos the morning of the procedure.  Carry some sugar containing items with you to your appointment. 4. A driver must accompany you and be prepared to drive you home after your procedure.  5. Bring all your current medications with your. 6. An IV may be inserted and sedation may be given at the discretion of the physician.   7. A blood pressure cuff, EKG and other monitors will often be applied during the procedure.  Some patients may need to have extra oxygen administered for a short period. 8. You will be asked to provide medical information, including your allergies, prior to the procedure.  We must know immediately if you are taking blood thinners (like Coumadin/Warfarin)  Or if you are allergic to IV iodine contrast (dye). We must know if you could possible be pregnant.  Possible side-effects:  Bleeding from needle site  Infection (rare, may require surgery)  Nerve injury (rare)  Numbness & tingling (temporary)  Difficulty urinating (rare, temporary)  Spinal headache ( a headache worse with upright posture)  Light -headedness (temporary)  Pain at injection site (several days)  Decreased blood pressure (temporary)  Weakness in arm/leg  (temporary)  Pressure sensation in back/neck (temporary)  Call if you experience:  Fever/chills associated with headache or increased back/neck pain.  Headache worsened by an upright position.  New onset weakness or numbness of an extremity below the injection site  Hives or difficulty breathing (go to the emergency room)  Inflammation or drainage at the infection site  Severe back/neck pain  Any new symptoms which are concerning to  you  Please note:  Although the local anesthetic injected can often make your back or neck feel good for several hours after the injection, the pain will likely return.  It takes 3-7 days for steroids to work in the epidural space.  You may not notice any pain relief for at least that one week.  If effective, we will often do a series of three injections spaced 3-6 weeks apart to maximally decrease your pain.  After the initial series, we generally will wait several months before considering a repeat injection of the same type.  If you have any questions, please call [phone] [facility]

## 2018-04-24 NOTE — Patient Instructions (Signed)
Epidural Steroid Injection Patient Information  Description: The epidural space surrounds the nerves as they exit the spinal cord.  In some patients, the nerves can be compressed and inflamed by a bulging disc or a tight spinal canal (spinal stenosis).  By injecting steroids into the epidural space, we can bring irritated nerves into direct contact with a potentially helpful medication.  These steroids act directly on the irritated nerves and can reduce swelling and inflammation which often leads to decreased pain.  Epidural steroids may be injected anywhere along the spine and from the neck to the low back depending upon the location of your pain.   After numbing the skin with local anesthetic (like Novocaine), a small needle is passed into the epidural space slowly.  You may experience a sensation of pressure while this is being done.  The entire block usually last less than 10 minutes.  Conditions which may be treated by epidural steroids:   Low back and leg pain  Neck and arm pain  Spinal stenosis  Post-laminectomy syndrome  Herpes zoster (shingles) pain  Pain from compression fractures  Preparation for the injection:  1. Do not eat any solid food or dairy products within 8 hours of your appointment.  2. You may drink clear liquids up to 3 hours before appointment.  Clear liquids include water, black coffee, juice or soda.  No milk or cream please. 3. You may take your regular medication, including pain medications, with a sip of water before your appointment  Diabetics should hold regular insulin (if taken separately) and take 1/2 normal NPH dos the morning of the procedure.  Carry some sugar containing items with you to your appointment. 4. A driver must accompany you and be prepared to drive you home after your procedure.  5. Bring all your current medications with your. 6. An IV may be inserted and sedation may be given at the discretion of the physician.   7. A blood pressure  cuff, EKG and other monitors will often be applied during the procedure.  Some patients may need to have extra oxygen administered for a short period. 8. You will be asked to provide medical information, including your allergies, prior to the procedure.  We must know immediately if you are taking blood thinners (like Coumadin/Warfarin)  Or if you are allergic to IV iodine contrast (dye). We must know if you could possible be pregnant.  Possible side-effects:  Bleeding from needle site  Infection (rare, may require surgery)  Nerve injury (rare)  Numbness & tingling (temporary)  Difficulty urinating (rare, temporary)  Spinal headache ( a headache worse with upright posture)  Light -headedness (temporary)  Pain at injection site (several days)  Decreased blood pressure (temporary)  Weakness in arm/leg (temporary)  Pressure sensation in back/neck (temporary)  Call if you experience:  Fever/chills associated with headache or increased back/neck pain.  Headache worsened by an upright position.  New onset weakness or numbness of an extremity below the injection site  Hives or difficulty breathing (go to the emergency room)  Inflammation or drainage at the infection site  Severe back/neck pain  Any new symptoms which are concerning to you  Please note:  Although the local anesthetic injected can often make your back or neck feel good for several hours after the injection, the pain will likely return.  It takes 3-7 days for steroids to work in the epidural space.  You may not notice any pain relief for at least that one week.    If effective, we will often do a series of three injections spaced 3-6 weeks apart to maximally decrease your pain.  After the initial series, we generally will wait several months before considering a repeat injection of the same type.  If you have any questions, please call [phone] [facility] 

## 2018-04-24 NOTE — Progress Notes (Signed)
Safety precautions to be maintained throughout the outpatient stay will include: orient to surroundings, keep bed in low position, maintain call bell within reach at all times, provide assistance with transfer out of bed and ambulation.  

## 2018-05-21 ENCOUNTER — Ambulatory Visit
Admission: RE | Admit: 2018-05-21 | Discharge: 2018-05-21 | Disposition: A | Discharge status: Home / Self Care | Payer: Medicare HMO | Source: Ambulatory Visit | Attending: Student in an Organized Health Care Education/Training Program | Admitting: Student in an Organized Health Care Education/Training Program

## 2018-05-21 ENCOUNTER — Encounter: Payer: Self-pay | Admitting: Student in an Organized Health Care Education/Training Program

## 2018-05-21 ENCOUNTER — Ambulatory Visit (HOSPITAL_BASED_OUTPATIENT_CLINIC_OR_DEPARTMENT_OTHER): Payer: Medicare HMO | Admitting: Student in an Organized Health Care Education/Training Program

## 2018-05-21 ENCOUNTER — Telehealth: Payer: Self-pay

## 2018-05-21 DIAGNOSIS — M5417 Radiculopathy, lumbosacral region: Secondary | ICD-10-CM | POA: Insufficient documentation

## 2018-05-21 DIAGNOSIS — Z791 Long term (current) use of non-steroidal anti-inflammatories (NSAID): Secondary | ICD-10-CM | POA: Diagnosis not present

## 2018-05-21 DIAGNOSIS — Z888 Allergy status to other drugs, medicaments and biological substances status: Secondary | ICD-10-CM | POA: Diagnosis not present

## 2018-05-21 DIAGNOSIS — Z7982 Long term (current) use of aspirin: Secondary | ICD-10-CM | POA: Insufficient documentation

## 2018-05-21 DIAGNOSIS — F419 Anxiety disorder, unspecified: Secondary | ICD-10-CM | POA: Insufficient documentation

## 2018-05-21 DIAGNOSIS — Z79890 Hormone replacement therapy (postmenopausal): Secondary | ICD-10-CM | POA: Diagnosis not present

## 2018-05-21 DIAGNOSIS — Z79811 Long term (current) use of aromatase inhibitors: Secondary | ICD-10-CM | POA: Insufficient documentation

## 2018-05-21 DIAGNOSIS — Z880 Allergy status to penicillin: Secondary | ICD-10-CM | POA: Insufficient documentation

## 2018-05-21 DIAGNOSIS — Z91013 Allergy to seafood: Secondary | ICD-10-CM | POA: Insufficient documentation

## 2018-05-21 DIAGNOSIS — Z79899 Other long term (current) drug therapy: Secondary | ICD-10-CM | POA: Insufficient documentation

## 2018-05-21 DIAGNOSIS — M5416 Radiculopathy, lumbar region: Secondary | ICD-10-CM

## 2018-05-21 DIAGNOSIS — Z90710 Acquired absence of both cervix and uterus: Secondary | ICD-10-CM | POA: Insufficient documentation

## 2018-05-21 DIAGNOSIS — Z885 Allergy status to narcotic agent status: Secondary | ICD-10-CM | POA: Diagnosis not present

## 2018-05-21 DIAGNOSIS — M545 Low back pain: Secondary | ICD-10-CM | POA: Diagnosis present

## 2018-05-21 MED ORDER — DEXAMETHASONE SODIUM PHOSPHATE 10 MG/ML IJ SOLN
10.0000 mg | Freq: Once | INTRAMUSCULAR | Status: AC
  Administered 2018-05-21: 10 mg
  Filled 2018-05-21: qty 1

## 2018-05-21 MED ORDER — IOPAMIDOL (ISOVUE-M 200) INJECTION 41%
10.0000 mL | Freq: Once | INTRAMUSCULAR | Status: AC
  Administered 2018-05-21: 10 mL via EPIDURAL
  Filled 2018-05-21: qty 10

## 2018-05-21 MED ORDER — LACTATED RINGERS IV SOLN
1000.0000 mL | Freq: Once | INTRAVENOUS | Status: AC
Start: 2018-05-21 — End: 2018-05-21
  Administered 2018-05-21: 1000 mL via INTRAVENOUS

## 2018-05-21 MED ORDER — FENTANYL CITRATE (PF) 100 MCG/2ML IJ SOLN
25.0000 ug | INTRAMUSCULAR | Status: DC | PRN
  Administered 2018-05-21: 50 ug via INTRAVENOUS
  Filled 2018-05-21: qty 2

## 2018-05-21 MED ORDER — SODIUM CHLORIDE 0.9% FLUSH
2.0000 mL | Freq: Once | INTRAVENOUS | Status: AC
  Administered 2018-05-21: 2 mL

## 2018-05-21 MED ORDER — LIDOCAINE HCL (PF) 1 % IJ SOLN
4.5000 mL | Freq: Once | INTRAMUSCULAR | Status: AC
  Administered 2018-05-21: 4.5 mL
  Filled 2018-05-21: qty 5

## 2018-05-21 MED ORDER — ROPIVACAINE HCL 2 MG/ML IJ SOLN
2.0000 mL | Freq: Once | INTRAMUSCULAR | Status: AC
  Administered 2018-05-21: 2 mL via EPIDURAL
  Filled 2018-05-21: qty 10

## 2018-05-21 NOTE — Progress Notes (Signed)
Safety precautions to be maintained throughout the outpatient stay will include: orient to surroundings, keep bed in low position, maintain call bell within reach at all times, provide assistance with transfer out of bed and ambulation.  

## 2018-05-21 NOTE — Progress Notes (Signed)
Patient's Name: Debra Kelly  MRN: [id number]  Referring Provider: Gillis Santa, MD  DOB: [birth year]  PCP: Rusty Aus, MD  DOS: 05/21/2018  Note by: Gillis Santa, MD  Service setting: Ambulatory outpatient  Specialty: Interventional Pain Management  Patient type: Established  Location: [facility] (AMB) Pain Management Facility  Visit type: Interventional Procedure   Primary Reason for Visit: Interventional Pain Management Treatment. CC: Back Pain (lower lumbar left is worse)  Procedure:       Anesthesia, Analgesia, Anxiolysis:  Type: Therapeutic Inter-Laminar Epidural Steroid Injection #2  Region: Lumbar Level: L4-5 Level. Laterality: Left-Sided         Type: Moderate (Conscious) Sedation combined with Local Anesthesia Indication(s): Analgesia and Anxiety Route: Intravenous (IV) IV Access: Secured Sedation: Meaningful verbal contact was maintained at all times during the procedure  Local Anesthetic: Lidocaine 1%   Indications: 1. Lumbar radiculopathy  (LEFT L5, S1)    Pain Score: Pre-procedure: 4 /10 Post-procedure: 0-No pain/10  Pre-op Assessment:  Debra Kelly is a 77 y.o. (year old), female patient, seen today for interventional treatment. She  has a past surgical history that includes Tubal ligation (09-13-12); Appendectomy (09-13-12); Abdominal hysterectomy (09-13-12); Knee arthroscopy (09-13-12); Cystocele repair (09/18/2012); Vaginal prolapse repair (09/18/2012); Cystoscopy (09/18/2012); Rectocele repair (N/A, 10/15/2013); Breast lumpectomy with radioactive seed and sentinel lymph node biopsy (Right, 05/08/2015); Breast excisional biopsy (Right, 1993); Breast surgery (09-13-12); Breast biopsy (Right, 04/16/2015); Breast biopsy (Left, 02/18/2014); Lumbar laminectomy/decompression microdiscectomy (N/A, 09/20/2017); and Breast lumpectomy (Right, 05/08/2015). Debra Kelly has a current medication list which includes the following prescription(s): acetaminophen, alprazolam, anastrozole, aspirin ec,  baclofen, hydrochlorothiazide, levothyroxine, losartan, meloxicam, multiple vitamins-minerals, oxybutynin, potassium, tramadol, vitamin c, baclofen, cholecalciferol, losartan, magnesium, oxycodone, and vitamin b-12, and the following Facility-Administered Medications: fentanyl. Her primarily concern today is the Back Pain (lower lumbar left is worse)  Initial Vital Signs:  Pulse/HCG Rate: 91ECG Heart Rate: 73 Temp: 98.2 F (36.8 C) Resp: 16 BP: (!) 149/73 SpO2: 99 %  BMI: Estimated body mass index is 29.7 kg/m as calculated from the following:   Height as of this encounter: 5\' 4"  (1.626 m).   Weight as of this encounter: 173 lb (78.5 kg).  Risk Assessment: Allergies: Reviewed. She is allergic to shellfish allergy; lisinopril; radiaplexrx [skin protectants, misc.]; adhesive [tape]; amoxicillin-pot clavulanate; barbiturates; celecoxib; codeine; loratadine; and tapentadol.  Allergy Precautions: None required Coagulopathies: Reviewed. None identified.  Blood-thinner therapy: None at this time Active Infection(s): Reviewed. None identified. Debra Kelly is afebrile  Site Confirmation: Debra Kelly was asked to confirm the procedure and laterality before marking the site Procedure checklist: Completed Consent: Before the procedure and under the influence of no sedative(s), amnesic(s), or anxiolytics, the patient was informed of the treatment options, risks and possible complications. To fulfill our ethical and legal obligations, as recommended by the American Medical Association's Code of Ethics, I have informed the patient of my clinical impression; the nature and purpose of the treatment or procedure; the risks, benefits, and possible complications of the intervention; the alternatives, including doing nothing; the risk(s) and benefit(s) of the alternative treatment(s) or procedure(s); and the risk(s) and benefit(s) of doing nothing. The patient was provided information about the general risks and  possible complications associated with the procedure. These may include, but are not limited to: failure to achieve desired goals, infection, bleeding, organ or nerve damage, allergic reactions, paralysis, and death. In addition, the patient was informed of those risks and complications associated to Spine-related procedures, such as failure to decrease pain;  infection (i.e.: Meningitis, epidural or intraspinal abscess); bleeding (i.e.: epidural hematoma, subarachnoid hemorrhage, or any other type of intraspinal or peri-dural bleeding); organ or nerve damage (i.e.: Any type of peripheral nerve, nerve root, or spinal cord injury) with subsequent damage to sensory, motor, and/or autonomic systems, resulting in permanent pain, numbness, and/or weakness of one or several areas of the body; allergic reactions; (i.e.: anaphylactic reaction); and/or death. Furthermore, the patient was informed of those risks and complications associated with the medications. These include, but are not limited to: allergic reactions (i.e.: anaphylactic or anaphylactoid reaction(s)); adrenal axis suppression; blood sugar elevation that in diabetics may result in ketoacidosis or comma; water retention that in patients with history of congestive heart failure may result in shortness of breath, pulmonary edema, and decompensation with resultant heart failure; weight gain; swelling or edema; medication-induced neural toxicity; particulate matter embolism and blood vessel occlusion with resultant organ, and/or nervous system infarction; and/or aseptic necrosis of one or more joints. Finally, the patient was informed that Medicine is not an exact science; therefore, there is also the possibility of unforeseen or unpredictable risks and/or possible complications that may result in a catastrophic outcome. The patient indicated having understood very clearly. We have given the patient no guarantees and we have made no promises. Enough time was  given to the patient to ask questions, all of which were answered to the patient's satisfaction. Debra Kelly has indicated that she wanted to continue with the procedure. Attestation: I, the ordering provider, attest that I have discussed with the patient the benefits, risks, side-effects, alternatives, likelihood of achieving goals, and potential problems during recovery for the procedure that I have provided informed consent. Date  Time: 05/21/2018  8:41 AM  Pre-Procedure Preparation:  Monitoring: As per clinic protocol. Respiration, ETCO2, SpO2, BP, heart rate and rhythm monitor placed and checked for adequate function Safety Precautions: Patient was assessed for positional comfort and pressure points before starting the procedure. Time-out: I initiated and conducted the "Time-out" before starting the procedure, as per protocol. The patient was asked to participate by confirming the accuracy of the "Time Out" information. Verification of the correct person, site, and procedure were performed and confirmed by me, the nursing staff, and the patient. "Time-out" conducted as per Joint Commission's Universal Protocol (UP.01.01.01). Time: 0914  Description of Procedure:       Position: Prone with head of the table was raised to facilitate breathing. Target Area: The interlaminar space, initially targeting the lower laminar border of the superior vertebral body. Approach: Paramedial approach. Area Prepped: Entire Posterior Lumbar Region Prepping solution: ChloraPrep (2% chlorhexidine gluconate and 70% isopropyl alcohol) Safety Precautions: Aspiration looking for blood return was conducted prior to all injections. At no point did we inject any substances, as a needle was being advanced. No attempts were made at seeking any paresthesias. Safe injection practices and needle disposal techniques used. Medications properly checked for expiration dates. SDV (single dose vial) medications used. Description of the  Procedure: Protocol guidelines were followed. The procedure needle was introduced through the skin, ipsilateral to the reported pain, and advanced to the target area. Bone was contacted and the needle walked caudad, until the lamina was cleared. The epidural space was identified using "loss-of-resistance technique" with 2-3 ml of PF-NaCl (0.9% NSS), in a 5cc LOR glass syringe. Vitals:   05/21/18 0928 05/21/18 0930 05/21/18 0939 05/21/18 0950  BP: (!) 148/74 122/70 136/69 128/70  Pulse: 82     Resp: 13 16 17 19   Temp:  97.6 F (36.4 C)    TempSrc:      SpO2: 97% 98% 98% 99%  Weight:      Height:        Start Time: 0914 hrs. End Time: 0925 hrs. Materials:  Needle(s) Type: Epidural needle Gauge: 17G Length: 3.5-in Medication(s): Please see orders for medications and dosing details. 9 CC solution made a 6 cc of preservative-free saline, 2 cc of 0.2% ropivacaine, 1 cc of Decadron 10 mg/cc. Imaging Guidance (Spinal):  Type of Imaging Technique: Fluoroscopy Guidance (Spinal) Indication(s): Assistance in needle guidance and placement for procedures requiring needle placement in or near specific anatomical locations not easily accessible without such assistance. Exposure Time: Please see nurses notes. Contrast: Before injecting any contrast, we confirmed that the patient did not have an allergy to iodine, shellfish, or radiological contrast. Once satisfactory needle placement was completed at the desired level, radiological contrast was injected. Contrast injected under live fluoroscopy. No contrast complications. See chart for type and volume of contrast used. Fluoroscopic Guidance: I was personally present during the use of fluoroscopy. "Tunnel Vision Technique" used to obtain the best possible view of the target area. Parallax error corrected before commencing the procedure. "Direction-depth-direction" technique used to introduce the needle under continuous pulsed fluoroscopy. Once target was  reached, antero-posterior, oblique, and lateral fluoroscopic projection used confirm needle placement in all planes. Images permanently stored in EMR. Interpretation: I personally interpreted the imaging intraoperatively. Adequate needle placement confirmed in multiple planes. Appropriate spread of contrast into desired area was observed. No evidence of afferent or efferent intravascular uptake. No intrathecal or subarachnoid spread observed. Permanent images saved into the patient's record.  Antibiotic Prophylaxis:   Anti-infectives (From admission, onward)   None     Indication(s): None identified  Post-operative Assessment:  Post-procedure Vital Signs:  Pulse/HCG Rate: 8275 Temp: 97.6 F (36.4 C) Resp: 19 BP: 128/70 SpO2: 99 %  EBL: None  Complications: No immediate post-treatment complications observed by team, or reported by patient.  Note: The patient tolerated the entire procedure well. A repeat set of vitals were taken after the procedure and the patient was kept under observation following institutional policy, for this type of procedure. Post-procedural neurological assessment was performed, showing return to baseline, prior to discharge. The patient was provided with post-procedure discharge instructions, including a section on how to identify potential problems. Should any problems arise concerning this procedure, the patient was given instructions to immediately contact us, at any time, without hesitation. In any case, we plan to contact the patient by telephone for a follow-up status report regarding this interventional procedure.  Comments:  No additional relevant information. 5 out of 5 strength bilateral lower extremity: Plantar flexion, dorsiflexion, knee flexion, knee extension.  Plan of Care   Imaging Orders     DG C-Arm 1-60 Min-No Report Procedure Orders    No procedure(s) ordered today    Medications ordered for procedure: Meds ordered this encounter   Medications  . lactated ringers infusion 1,000 mL  . fentaNYL (SUBLIMAZE) injection 25-100 mcg    Make sure Narcan is available in the pyxis when using this medication. In the event of respiratory depression (RR< 8/min): Titrate NARCAN (naloxone) in increments of 0.1 to 0.2 mg IV at 2-3 minute intervals, until desired degree of reversal.  . iopamidol (ISOVUE-M) 41 % intrathecal injection 10 mL  . ropivacaine (PF) 2 mg/mL (0.2%) (NAROPIN) injection 2 mL  . sodium chloride flush (NS) 0.9 % injection 2 mL  . dexamethasone (  DECADRON) injection 10 mg  . lidocaine (PF) (XYLOCAINE) 1 % injection 4.5 mL   Medications administered: We administered lactated ringers, fentaNYL, iopamidol, ropivacaine (PF) 2 mg/mL (0.2%), sodium chloride flush, dexamethasone, and lidocaine (PF).  See the medical record for exact dosing, route, and time of administration.  New Prescriptions   No medications on file   Disposition: Discharge home  Discharge Date & Time: 05/21/2018; 1000 hrs.   Physician-requested Follow-up: Return in about 1 month (around 06/18/2018) for Post Procedure Evaluation.  Future Appointments  Date Time Provider [place]  06/20/2018 12:45 PM Gillis Santa, MD [facility] None  11/19/2018  9:15 AM Nicholas Lose, MD [facility] None   Primary Care Physician: Rusty Aus, MD Location: [facility] Note by: Gillis Santa, MD Date: 05/21/2018; Time: 10:30 AM  Disclaimer:  Medicine is not an exact science. The only guarantee in medicine is that nothing is guaranteed. It is important to note that the decision to proceed with this intervention was based on the information collected from the patient. The Data and conclusions were drawn from the patient's questionnaire, the interview, and the physical examination. Because the information was provided in large part by the patient, it cannot be guaranteed that it has not been purposely or unconsciously manipulated.  Every effort has been made to obtain as much relevant data as possible for this evaluation. It is important to note that the conclusions that lead to this procedure are derived in large part from the available data. Always take into account that the treatment will also be dependent on availability of resources and existing treatment guidelines, considered by other Pain Management Practitioners as being common knowledge and practice, at the time of the intervention. For Medico-Legal purposes, it is also important to point out that variation in procedural techniques and pharmacological choices are the acceptable norm. The indications, contraindications, technique, and results of the above procedure should only be interpreted and judged by a Board-Certified Interventional Pain Specialist with extensive familiarity and expertise in the same exact procedure and technique.

## 2018-05-21 NOTE — Patient Instructions (Signed)

## 2018-05-22 ENCOUNTER — Telehealth: Payer: Self-pay

## 2018-05-22 NOTE — Telephone Encounter (Signed)
Post procedure phone call.  Left message.  

## 2018-06-20 ENCOUNTER — Ambulatory Visit
Payer: Medicare HMO | Attending: Student in an Organized Health Care Education/Training Program | Admitting: Student in an Organized Health Care Education/Training Program

## 2018-06-20 ENCOUNTER — Encounter: Payer: Self-pay | Admitting: Student in an Organized Health Care Education/Training Program

## 2018-06-20 VITALS — BP 130/79 | HR 82 | Temp 98.2°F | Resp 16 | Ht 64.0 in | Wt 165.8 lb

## 2018-06-20 DIAGNOSIS — E039 Hypothyroidism, unspecified: Secondary | ICD-10-CM | POA: Insufficient documentation

## 2018-06-20 DIAGNOSIS — M79662 Pain in left lower leg: Secondary | ICD-10-CM | POA: Diagnosis not present

## 2018-06-20 DIAGNOSIS — M9983 Other biomechanical lesions of lumbar region: Secondary | ICD-10-CM

## 2018-06-20 DIAGNOSIS — M5116 Intervertebral disc disorders with radiculopathy, lumbar region: Secondary | ICD-10-CM | POA: Insufficient documentation

## 2018-06-20 DIAGNOSIS — Z79899 Other long term (current) drug therapy: Secondary | ICD-10-CM | POA: Diagnosis not present

## 2018-06-20 DIAGNOSIS — M1711 Unilateral primary osteoarthritis, right knee: Secondary | ICD-10-CM | POA: Diagnosis not present

## 2018-06-20 DIAGNOSIS — M7989 Other specified soft tissue disorders: Secondary | ICD-10-CM | POA: Insufficient documentation

## 2018-06-20 DIAGNOSIS — Z79890 Hormone replacement therapy (postmenopausal): Secondary | ICD-10-CM | POA: Insufficient documentation

## 2018-06-20 DIAGNOSIS — M48061 Spinal stenosis, lumbar region without neurogenic claudication: Secondary | ICD-10-CM | POA: Diagnosis not present

## 2018-06-20 DIAGNOSIS — M5416 Radiculopathy, lumbar region: Secondary | ICD-10-CM | POA: Diagnosis not present

## 2018-06-20 DIAGNOSIS — M5136 Other intervertebral disc degeneration, lumbar region: Secondary | ICD-10-CM | POA: Diagnosis not present

## 2018-06-20 DIAGNOSIS — Z7982 Long term (current) use of aspirin: Secondary | ICD-10-CM | POA: Diagnosis not present

## 2018-06-20 DIAGNOSIS — Z853 Personal history of malignant neoplasm of breast: Secondary | ICD-10-CM | POA: Diagnosis not present

## 2018-06-20 DIAGNOSIS — M545 Low back pain: Secondary | ICD-10-CM | POA: Diagnosis present

## 2018-06-20 DIAGNOSIS — G894 Chronic pain syndrome: Secondary | ICD-10-CM | POA: Diagnosis not present

## 2018-06-20 DIAGNOSIS — I10 Essential (primary) hypertension: Secondary | ICD-10-CM | POA: Insufficient documentation

## 2018-06-20 DIAGNOSIS — M961 Postlaminectomy syndrome, not elsewhere classified: Secondary | ICD-10-CM

## 2018-06-20 MED ORDER — BACLOFEN 10 MG PO TABS: 10.0000 mg | ORAL_TABLET | Freq: Three times a day (TID) | ORAL | 3 refills | Status: DC | PRN

## 2018-06-20 NOTE — Progress Notes (Signed)
Safety precautions to be maintained throughout the outpatient stay will include: orient to surroundings, keep bed in low position, maintain call bell within reach at all times, provide assistance with transfer out of bed and ambulation.  

## 2018-06-20 NOTE — Patient Instructions (Signed)
____________________________________________________________________________________________  Preparing for Procedure with Sedation  Instructions: . Oral Intake: Do not eat or drink anything for at least 8 hours prior to your procedure. . Transportation: Public transportation is not allowed. Bring an adult driver. The driver must be physically present in our waiting room before any procedure can be started. Marland Kitchen Physical Assistance: Bring an adult physically capable of assisting you, in the event you need help. This adult should keep you company at home for at least 6 hours after the procedure. . Blood Pressure Medicine: Take your blood pressure medicine with a sip of water the morning of the procedure. . Blood thinners:  . Diabetics on insulin: Notify the staff so that you can be scheduled 1st case in the morning. If your diabetes requires high dose insulin, take only  of your normal insulin dose the morning of the procedure and notify the staff that you have done so. . Preventing infections: Shower with an antibacterial soap the morning of your procedure. . Build-up your immune system: Take 1000 mg of Vitamin C with every meal (3 times a day) the day prior to your procedure. Marland Kitchen Antibiotics: Inform the staff if you have a condition or reason that requires you to take antibiotics before dental procedures. . Pregnancy: If you are pregnant, call and cancel the procedure. . Sickness: If you have a cold, fever, or any active infections, call and cancel the procedure. . Arrival: You must be in the facility at least 30 minutes prior to your scheduled procedure. . Children: Do not bring children with you. . Dress appropriately: Bring dark clothing that you would not mind if they get stained. . Valuables: Do not bring any jewelry or valuables.  Procedure appointments are reserved for interventional treatments only. Marland Kitchen No Prescription Refills. . No medication changes will be discussed during procedure  appointments. . No disability issues will be discussed.  Remember:  Regular Business hours are:  Monday to Thursday 8:00 AM to 4:00 PM  Provider's Schedule: Milinda Pointer, MD:  Procedure days: Tuesday and Thursday 7:30 AM to 4:00 PM  Gillis Santa, MD:  Procedure days: Monday and Wednesday 7:30 AM to 4:00 PM ____________________________________________________________________________________________  Epidural Steroid Injection An epidural steroid injection is a shot of steroid medicine and numbing medicine that is given into the space between the spinal cord and the bones in your back (epidural space). The shot helps relieve pain caused by an irritated or swollen nerve root. The amount of pain relief you get from the injection depends on what is causing the nerve to be swollen and irritated, and how long your pain lasts. You are more likely to benefit from this injection if your pain is strong and comes on suddenly rather than if you have had pain for a long time. Tell a health care provider about:  Any allergies you have.  All medicines you are taking, including vitamins, herbs, eye drops, creams, and over-the-counter medicines.  Any problems you or family members have had with anesthetic medicines.  Any blood disorders you have.  Any surgeries you have had.  Any medical conditions you have.  Whether you are pregnant or may be pregnant. What are the risks? Generally, this is a safe procedure. However, problems may occur, including:  Headache.  Bleeding.  Infection.  Allergic reaction to medicines.  Damage to your nerves.  What happens before the procedure? Staying hydrated Follow instructions from your health care provider about hydration, which may include:  Up to 2 hours before the  procedure - you may continue to drink clear liquids, such as water, clear fruit juice, black coffee, and plain tea.  Eating and drinking restrictions Follow instructions from your  health care provider about eating and drinking, which may include:  8 hours before the procedure - stop eating heavy meals or foods such as meat, fried foods, or fatty foods.  6 hours before the procedure - stop eating light meals or foods, such as toast or cereal.  6 hours before the procedure - stop drinking milk or drinks that contain milk.  2 hours before the procedure - stop drinking clear liquids.  Medicine  You may be given medicines to lower anxiety.  Ask your health care provider about: ? Changing or stopping your regular medicines. This is especially important if you are taking diabetes medicines or blood thinners. ? Taking medicines such as aspirin and ibuprofen. These medicines can thin your blood. Do not take these medicines before your procedure if your health care provider instructs you not to. General instructions  Plan to have someone take you home from the hospital or clinic. What happens during the procedure?  You may receive a medicine to help you relax (sedative).  You will be asked to lie on your abdomen.  The injection site will be cleaned.  A numbing medicine (local anesthetic) will be used to numb the injection site.  A needle will be inserted through your skin into the epidural space. You may feel some discomfort when this happens. An X-ray machine will be used to make sure the needle is put as close as possible to the affected nerve.  A steroid medicine and a local anesthetic will be injected into the epidural space.  The needle will be removed.  A bandage (dressing) will be put over the injection site. What happens after the procedure?  Your blood pressure, heart rate, breathing rate, and blood oxygen level will be monitored until the medicines you were given have worn off.  Your arm or leg may feel weak or numb for a few hours.  The injection site may feel sore.  Do not drive for 24 hours if you received a sedative. This information is not  intended to replace advice given to you by your health care provider. Make sure you discuss any questions you have with your health care provider. Document Released: 03/06/2008 Document Revised: 05/11/2016 Document Reviewed: 03/15/2016 [employer] Interactive Patient Education  [employer].

## 2018-06-20 NOTE — Progress Notes (Signed)
Patient's Name: Debra Kelly  MRN: [id number]  Referring Provider: Rusty Aus, MD  DOB: 06/08/1941  PCP: Rusty Aus, MD  DOS: 06/20/2018  Note by: Gillis Santa, MD  Service setting: Ambulatory outpatient  Specialty: Interventional Pain Management  Location: [facility] (AMB) Pain Management Facility    Patient type: Established   Primary Reason(s) for Visit: Encounter for post-procedure evaluation of chronic illness with mild to moderate exacerbation CC: Back Pain (lower left )  HPI  Debra Kelly is a 76 y.o. year old, female patient, who comes today for a post-procedure evaluation. She has Breast cancer of upper-outer quadrant of right female breast ([place]); Dermatitis; Primary osteoarthritis of right knee; Lumbar radiculopathy  (LEFT L5, S1); Post laminectomy syndrome; Lumbar degenerative disc disease; Lumbar foraminal stenosis; and Chronic pain syndrome on their problem list. Her primarily concern today is the Back Pain (lower left )  Pain Assessment: Location: Lower, Left Back Radiating: goes down the left leg to the ankle  Onset: More than a month ago Duration: Chronic pain Quality: Discomfort, Numbness, Sharp Severity: 2 /10 (subjective, self-reported pain score)  Note: Reported level is compatible with observation.                         When using our objective Pain Scale, levels between 6 and 10/10 are said to belong in an emergency room, as it progressively worsens from a 6/10, described as severely limiting, requiring emergency care not usually available at an outpatient pain management facility. At a 6/10 level, communication becomes difficult and requires great effort. Assistance to reach the emergency department may be required. Facial flushing and profuse sweating along with potentially dangerous increases in heart rate and blood pressure will be evident. Effect on ADL: endurance has improved after having procedure.  was able to go on vacation.  Timing: Intermittent Modifying factors:  pain medications and lying down  BP: 130/79  HR: 82  Debra Kelly comes in today for post-procedure evaluation after the treatment done on 05/22/2018.  Further details on both, my assessment(s), as well as the proposed treatment plan, please see below.  Post-Procedure Assessment  05/21/2018 Procedure: Left L4-L5 ESI #2 Pre-procedure pain score:  4/10 Post-procedure pain score: 0/10         Influential Factors: BMI: 28.46 kg/m Intra-procedural challenges: None observed.         Assessment challenges: None detected.              Reported side-effects: None.        Post-procedural adverse reactions or complications: None reported         Sedation: Please see nurses note. When no sedatives are used, the analgesic levels obtained are directly associated to the effectiveness of the local anesthetics. However, when sedation is provided, the level of analgesia obtained during the initial 1 hour following the intervention, is believed to be the result of a combination of factors. These factors may include, but are not limited to: 1. The effectiveness of the local anesthetics used. 2. The effects of the analgesic(s) and/or anxiolytic(s) used. 3. The degree of discomfort experienced by the patient at the time of the procedure. 4. The patients ability and reliability in recalling and recording the events. 5. The presence and influence of possible secondary gains and/or psychosocial factors. Reported result: Relief experienced during the 1st hour after the procedure: 100 % (Ultra-Short Term Relief)            Interpretative  annotation: Clinically appropriate result. Analgesia during this period is likely to be Local Anesthetic and/or IV Sedative (Analgesic/Anxiolytic) related.          Effects of local anesthetic: The analgesic effects attained during this period are directly associated to the localized infiltration of local anesthetics and therefore cary significant diagnostic value as to the etiological  location, or anatomical origin, of the pain. Expected duration of relief is directly dependent on the pharmacodynamics of the local anesthetic used. Long-acting (4-6 hours) anesthetics used.  Reported result: Relief during the next 4 to 6 hour after the procedure: 100 % (Short-Term Relief)            Interpretative annotation: Clinically appropriate result. Analgesia during this period is likely to be Local Anesthetic-related.          Long-term benefit: Defined as the period of time past the expected duration of local anesthetics (1 hour for short-acting and 4-6 hours for long-acting). With the possible exception of prolonged sympathetic blockade from the local anesthetics, benefits during this period are typically attributed to, or associated with, other factors such as analgesic sensory neuropraxia, antiinflammatory effects, or beneficial biochemical changes provided by agents other than the local anesthetics.  Reported result: Extended relief following procedure: 80 % (Long-Term Relief)            Interpretative annotation: Clinically appropriate result. Good relief. Therapeutic success. Inflammation plays a part in the etiology to the pain.          Current benefits: Defined as reported results that persistent at this point in time.   Analgesia: 75-100 %            Function: Debra Kelly reports improvement in function ROM: Debra Kelly reports improvement in ROM Interpretative annotation: Ongoing benefit. Therapeutic benefit observed. Effective therapeutic approach.          Interpretation: Results would suggest a successful diagnostic intervention.                  Plan:  Set up procedure as a PRN palliative treatment option for this patient.                Laboratory Chemistry  Inflammation Markers (CRP: Acute Phase) (ESR: Chronic Phase) No results found for: CRP, ESRSEDRATE, LATICACIDVEN                       Rheumatology Markers No results found for: RF, ANA, LABURIC, URICUR, LYMEIGGIGMAB,  LYMEABIGMQN, HLAB27                      Renal Function Markers Lab Results  Component Value Date   BUN 23 (H) 09/14/2017   CREATININE 0.72 09/14/2017   GFRAA >60 09/14/2017   GFRNONAA >60 09/14/2017                             Hepatic Function Markers Lab Results  Component Value Date   AST 13 04/22/2015   ALT 13 04/22/2015   ALBUMIN 3.6 04/22/2015   ALKPHOS 67 04/22/2015                        Electrolytes Lab Results  Component Value Date   NA 140 09/14/2017   K 4.0 09/14/2017   CL 103 09/14/2017   CALCIUM 9.4 09/14/2017  Neuropathy Markers No results found for: VITAMINB12, FOLATE, HGBA1C, HIV                      Bone Pathology Markers No results found for: VD25OH, [id number], [id number], [id number], 25OHVITD1, 25OHVITD2, 25OHVITD3, TESTOFREE, TESTOSTERONE                       Coagulation Parameters Lab Results  Component Value Date   INR 1.03 09/14/2017   LABPROT 13.4 09/14/2017   APTT 35 09/14/2017   PLT 250 09/14/2017                        Cardiovascular Markers Lab Results  Component Value Date   HGB 12.6 09/14/2017   HCT 37.0 09/14/2017                         CA Markers No results found for: CEA, CA125, LABCA2                      Note: Lab results reviewed.  Recent Diagnostic Imaging Results  DG C-Arm 1-60 Min-No Report Fluoroscopy was utilized by the requesting physician.  No radiographic  interpretation.   Complexity Note: Imaging results reviewed. Results shared with Ms. Felber, using Layman's terms.                         Meds   Current Outpatient Medications:  .  acetaminophen (TYLENOL) 500 MG tablet, Take 1,000 mg by mouth every 6 (six) hours as needed for moderate pain or headache., Disp: , Rfl:  .  ALPRAZolam (XANAX) 0.5 MG tablet, Take 0.5 tablets (0.25 mg total) by mouth 2 (two) times daily as needed for anxiety. (Patient taking differently: Take 0.5 mg by mouth at bedtime. ), Disp: 60 tablet, Rfl: 0 .   anastrozole (ARIMIDEX) 1 MG tablet, Take 1 tablet (1 mg total) by mouth daily., Disp: 90 tablet, Rfl: 3 .  aspirin EC 81 MG tablet, Take by mouth., Disp: , Rfl:  .  baclofen (LIORESAL) 10 MG tablet, Take 1 tablet (10 mg total) by mouth 3 (three) times daily as needed for muscle spasms., Disp: 90 tablet, Rfl: 3 .  losartan (COZAAR) 50 MG tablet, Take 1 tablet (50 mg total) by mouth daily., Disp: , Rfl:  .  meloxicam (MOBIC) 15 MG tablet, Take 15 mg by mouth daily., Disp: , Rfl:  .  Multiple Vitamins-Minerals (MULTIVITAMIN ADULT PO), Take 1 tablet by mouth daily., Disp: , Rfl:  .  oxybutynin (DITROPAN) 5 MG tablet, Take 5 mg by mouth daily. , Disp: , Rfl:  .  Potassium 99 MG TABS, Take 1 tablet (99 mg total) by mouth 2 (two) times daily. (Patient taking differently: Take 99 mg by mouth 2 (two) times daily. ), Disp: 330 each, Rfl: 0 .  traMADol (ULTRAM) 50 MG tablet, TAKE 1 TABLET (50 MG TOTAL) BY MOUTH 2 (TWO) TIMES DAILY AS NEEDED FOR PAIN, Disp: , Rfl: 1 .  vitamin B-12 (CYANOCOBALAMIN) 500 MCG tablet, Take 500 mcg by mouth daily., Disp: , Rfl:  .  Ascorbic Acid (VITAMIN C) 1000 MG tablet, Take 1,000 mg by mouth daily., Disp: , Rfl:  .  Cholecalciferol (VITAMIN D PO), Take 600 mg by mouth daily., Disp: , Rfl:  .  hydrochlorothiazide (HYDRODIURIL) 25 MG tablet, Take 25 mg by mouth as needed., Disp: ,  Rfl: 11 .  levothyroxine (SYNTHROID, LEVOTHROID) 175 MCG tablet, Take 175 mcg by mouth daily before breakfast., Disp: , Rfl:  .  losartan (COZAAR) 50 MG tablet, TAKE 1 TABLET BY MOUTH EVERY DAY, Disp: , Rfl:  .  Magnesium 500 MG CAPS, Take 250 mg by mouth daily. , Disp: , Rfl:   ROS  Constitutional: Denies any fever or chills Gastrointestinal: No reported hemesis, hematochezia, vomiting, or acute GI distress Musculoskeletal: Denies any acute onset joint swelling, redness, loss of ROM, or weakness Neurological: No reported episodes of acute onset apraxia, aphasia, dysarthria, agnosia, amnesia,  paralysis, loss of coordination, or loss of consciousness  Allergies  Ms. Dandy is allergic to shellfish allergy; lisinopril; radiaplexrx [skin protectants, misc.]; adhesive [tape]; amoxicillin-pot clavulanate; barbiturates; celecoxib; codeine; loratadine; and tapentadol.  PFSH  Drug: Ms. Kiger  reports that she does not use drugs. Alcohol:  reports that she drinks about 0.6 oz of alcohol per week. Tobacco:  reports that she has never smoked. She has never used smokeless tobacco. Medical:  has a past medical history of Allergy, Arthritis (09-13-12), Breast cancer ([place]) (04/16/15), Breast cancer of upper-outer quadrant of right female breast ([place]) (04/20/2015), Hypertension, Hypothyroidism (09-13-12), Personal history of radiation therapy, and Radiation (07/01/15-08/04/15). Surgical: Ms. Clagett  has a past surgical history that includes Tubal ligation (09-13-12); Appendectomy (09-13-12); Abdominal hysterectomy (09-13-12); Knee arthroscopy (09-13-12); Cystocele repair (09/18/2012); Vaginal prolapse repair (09/18/2012); Cystoscopy (09/18/2012); Rectocele repair (N/A, 10/15/2013); Breast lumpectomy with radioactive seed and sentinel lymph node biopsy (Right, 05/08/2015); Breast excisional biopsy (Right, 1993); Breast surgery (09-13-12); Breast biopsy (Right, 04/16/2015); Breast biopsy (Left, 02/18/2014); Lumbar laminectomy/decompression microdiscectomy (N/A, 09/20/2017); and Breast lumpectomy (Right, 05/08/2015). Family: family history includes Prostate cancer in her brother and brother.  Constitutional Exam  General appearance: Well nourished, well developed, and well hydrated. In no apparent acute distress Vitals:   06/20/18 1302  BP: 130/79  Pulse: 82  Resp: 16  Temp: 98.2 F (36.8 C)  TempSrc: Oral  SpO2: 100%  Weight: 165 lb 12.8 oz (75.2 kg)  Height: '5\' 4"'  (1.626 m)   BMI Assessment: Estimated body mass index is 28.46 kg/m as calculated from the following:   Height as of this encounter: '5\' 4"'  (1.626 m).    Weight as of this encounter: 165 lb 12.8 oz (75.2 kg).  BMI interpretation table: BMI level Category Range association with higher incidence of chronic pain  <18 kg/m2 Underweight   18.5-24.9 kg/m2 Ideal body weight   25-29.9 kg/m2 Overweight Increased incidence by 20%  30-34.9 kg/m2 Obese (Class I) Increased incidence by 68%  35-39.9 kg/m2 Severe obesity (Class II) Increased incidence by 136%  >40 kg/m2 Extreme obesity (Class III) Increased incidence by 254%   Patient's current BMI Ideal Body weight  Body mass index is 28.46 kg/m. Ideal body weight: 54.7 kg (120 lb 9.5 oz) Adjusted ideal body weight: 62.9 kg (138 lb 10.8 oz)   BMI Readings from Last 4 Encounters:  06/20/18 28.46 kg/m  05/21/18 29.70 kg/m  04/24/18 29.52 kg/m  04/02/18 29.70 kg/m   Wt Readings from Last 4 Encounters:  06/20/18 165 lb 12.8 oz (75.2 kg)  05/21/18 173 lb (78.5 kg)  04/24/18 172 lb (78 kg)  04/02/18 173 lb (78.5 kg)  Psych/Mental status: Alert, oriented x 3 (person, place, & time)       Eyes: PERLA Respiratory: No evidence of acute respiratory distress  Cervical Spine Area Exam  Skin & Axial Inspection: No masses, redness, edema, swelling, or associated skin lesions  Alignment: Symmetrical Functional ROM: Unrestricted ROM      Stability: No instability detected Muscle Tone/Strength: Functionally intact. No obvious neuro-muscular anomalies detected. Sensory (Neurological): Unimpaired Palpation: No palpable anomalies              Upper Extremity (UE) Exam    Side: Right upper extremity  Side: Left upper extremity  Skin & Extremity Inspection: Skin color, temperature, and hair growth are WNL. No peripheral edema or cyanosis. No masses, redness, swelling, asymmetry, or associated skin lesions. No contractures.  Skin & Extremity Inspection: Skin color, temperature, and hair growth are WNL. No peripheral edema or cyanosis. No masses, redness, swelling, asymmetry, or associated skin lesions. No  contractures.  Functional ROM: Unrestricted ROM          Functional ROM: Unrestricted ROM          Muscle Tone/Strength: Functionally intact. No obvious neuro-muscular anomalies detected.  Muscle Tone/Strength: Functionally intact. No obvious neuro-muscular anomalies detected.  Sensory (Neurological): Unimpaired          Sensory (Neurological): Unimpaired          Palpation: No palpable anomalies              Palpation: No palpable anomalies              Provocative Test(s):  Phalen's test: deferred Tinel's test: deferred Apley's scratch test (touch opposite shoulder):  Action 1 (Across chest): deferred Action 2 (Overhead): deferred Action 3 (LB reach): deferred   Provocative Test(s):  Phalen's test: deferred Tinel's test: deferred Apley's scratch test (touch opposite shoulder):  Action 1 (Across chest): deferred Action 2 (Overhead): deferred Action 3 (LB reach): deferred    Thoracic Spine Area Exam  Skin & Axial Inspection: No masses, redness, or swelling Alignment: Symmetrical Functional ROM: Unrestricted ROM Stability: No instability detected Muscle Tone/Strength: Functionally intact. No obvious neuro-muscular anomalies detected. Sensory (Neurological): Unimpaired Muscle strength & Tone: No palpable anomalies Lumbar Spine Area Exam  Skin & Axial Inspection: No masses, redness, or swelling Alignment: Symmetrical Functional ROM: Improved after treatment       Stability: No instability detected Muscle Tone/Strength: Functionally intact. No obvious neuro-muscular anomalies detected. Sensory (Neurological): Improved Palpation: No palpable anomalies       Provocative Tests: Lumbar Hyperextension and rotation test: evaluation deferred today       Lumbar Lateral bending test: Positive ipsilateral radicular pain, on the left. Positive for left-sided foraminal stenosis. Patrick's Maneuver: evaluation deferred today                    Gait & Posture Assessment  Ambulation:  difficult, antalgic Gait: Limited. Using assistive device to ambulate Posture: Difficulty standing up straight, due to pain   Lower Extremity Exam    Side: Right lower extremity  Side: Left lower extremity  Stability: No instability observed          Stability: No instability observed          Skin & Extremity Inspection: Skin color, temperature, and hair growth are WNL. No peripheral edema or cyanosis. No masses, redness, swelling, asymmetry, or associated skin lesions. No contractures.  Skin & Extremity Inspection:  Left calf swelling, 2+ pitting edema  Functional ROM: Unrestricted ROM                  Functional ROM: Unrestricted ROM                  Muscle Tone/Strength: Functionally intact. No obvious neuro-muscular anomalies  detected.  Muscle Tone/Strength: Functionally intact. No obvious neuro-muscular anomalies detected.  Sensory (Neurological): Unimpaired  Sensory (Neurological): Unimpaired  Palpation: No palpable anomalies  Palpation:  Tender to palpation along medial calf    Assessment  Primary Diagnosis & Pertinent Problem List: The primary encounter diagnosis was Pain of left calf. Diagnoses of Lumbar radiculopathy  (LEFT L5, S1), Lumbar foraminal stenosis, Post laminectomy syndrome, Lumbar degenerative disc disease, and Chronic pain syndrome were also pertinent to this visit.  Status Diagnosis  Persistent Improving Persistent 1. Pain of left calf   2. Lumbar radiculopathy  (LEFT L5, S1)   3. Lumbar foraminal stenosis   4. Post laminectomy syndrome   5. Lumbar degenerative disc disease   6. Chronic pain syndrome      General Recommendations: The pain condition that the patient suffers from is best treated with a multidisciplinary approach that involves an increase in physical activity to prevent de-conditioning and worsening of the pain cycle, as well as psychological counseling (formal and/or informal) to address the co-morbid psychological affects of pain.  Treatment will often involve judicious use of pain medications and interventional procedures to decrease the pain, allowing the patient to participate in the physical activity that will ultimately produce long-lasting pain reductions. The goal of the multidisciplinary approach is to return the patient to a higher level of overall function and to restore their ability to perform activities of daily living.  77 year old female with a history of L5-S1 decompression (September 20, 2017 which was a L5/S1laminectomy/decompression microdiscectomy with Dr. Cari Caraway) who presents with left lumbar radiculopathy most pronounced at left L5-S1 status post left L4-L5 lumbar epidural steroid injection #2 (#1 performed on 04/02/2018) who returns for follow-up.  Patient endorses significant pain relief and improvement in her functional status after her epidural steroid injection.  Patient was able to go on a camping trip with her family over 4 July and states that she was able to walk an extended distance without as much pain.  She states that her radicular/dermatomal pain that extends from her back down her buttocks on her lateral thigh has improved but she is now endorsing focal pain along her left calf that is reproducible upon palpation.  Patient does have mild lower extremity pitting edema.  2+ left dorsalis pedis pulse.  We will obtain Dopplers of her lower extremities to rule out DVT given that she does have mild swelling as well as focal calf pain.  Plan: -Refill baclofen as below -Order for lower extremity Dopplers to evaluate for DVT given left calf swelling and pain -PRN lumbar epidural steroid injection at left L4-L5.   Plan of Care  Pharmacotherapy (Medications Ordered): Meds ordered this encounter  Medications  . baclofen (LIORESAL) 10 MG tablet    Sig: Take 1 tablet (10 mg total) by mouth 3 (three) times daily as needed for muscle spasms.    Dispense:  90 tablet    Refill:  3   Lab-work,  procedure(s), and/or referral(s): Orders Placed This Encounter  Procedures  . Lumbar Epidural Injection   Time Note: Greater than 50% of the 25 minute(s) of face-to-face time spent with Ms. Kanner, was spent in counseling/coordination of care regarding: Ms. Ciocca's primary cause of pain, the treatment plan, treatment alternatives, the risks and possible complications of proposed treatment, medication side effects, the results, interpretation and significance of  her recent diagnostic interventional treatment(s), realistic expectations and the goals of pain management (increased in functionality).  Provider-requested follow-up: Return if symptoms worsen or fail to  improve.  Future Appointments  Date Time Provider [place]  11/19/2018  9:15 AM Nicholas Lose, MD [facility] None    Primary Care Physician: Rusty Aus, MD Location: [facility] Out[facility] Facility Note by: Gillis Santa, M.D Date: 06/20/2018; Time: 4:18 PM  Patient Instructions  ____________________________________________________________________________________________  Preparing for Procedure with Sedation  Instructions: . Oral Intake: Do not eat or drink anything for at least 8 hours prior to your procedure. . Transportation: Public transportation is not allowed. Bring an adult driver. The driver must be physically present in our waiting room before any procedure can be started. Marland Kitchen Physical Assistance: Bring an adult physically capable of assisting you, in the event you need help. This adult should keep you company at home for at least 6 hours after the procedure. . Blood Pressure Medicine: Take your blood pressure medicine with a sip of water the morning of the procedure. . Blood thinners:  . Diabetics on insulin: Notify the staff so that you can be scheduled 1st case in the morning. If your diabetes requires high dose insulin, take only  of your normal insulin dose the morning of the procedure and notify  the staff that you have done so. . Preventing infections: Shower with an antibacterial soap the morning of your procedure. . Build-up your immune system: Take 1000 mg of Vitamin C with every meal (3 times a day) the day prior to your procedure. Marland Kitchen Antibiotics: Inform the staff if you have a condition or reason that requires you to take antibiotics before dental procedures. . Pregnancy: If you are pregnant, call and cancel the procedure. . Sickness: If you have a cold, fever, or any active infections, call and cancel the procedure. . Arrival: You must be in the facility at least 30 minutes prior to your scheduled procedure. . Children: Do not bring children with you. . Dress appropriately: Bring dark clothing that you would not mind if they get stained. . Valuables: Do not bring any jewelry or valuables.  Procedure appointments are reserved for interventional treatments only. Marland Kitchen No Prescription Refills. . No medication changes will be discussed during procedure appointments. . No disability issues will be discussed.  Remember:  Regular Business hours are:  Monday to Thursday 8:00 AM to 4:00 PM  Provider's Schedule: Milinda Pointer, MD:  Procedure days: Tuesday and Thursday 7:30 AM to 4:00 PM  Gillis Santa, MD:  Procedure days: Monday and Wednesday 7:30 AM to 4:00 PM ____________________________________________________________________________________________  Epidural Steroid Injection An epidural steroid injection is a shot of steroid medicine and numbing medicine that is given into the space between the spinal cord and the bones in your back (epidural space). The shot helps relieve pain caused by an irritated or swollen nerve root. The amount of pain relief you get from the injection depends on what is causing the nerve to be swollen and irritated, and how long your pain lasts. You are more likely to benefit from this injection if your pain is strong and comes on suddenly rather than if  you have had pain for a long time. Tell a health care provider about:  Any allergies you have.  All medicines you are taking, including vitamins, herbs, eye drops, creams, and over-the-counter medicines.  Any problems you or family members have had with anesthetic medicines.  Any blood disorders you have.  Any surgeries you have had.  Any medical conditions you have.  Whether you are pregnant or may be pregnant. What are the risks? Generally, this is  a safe procedure. However, problems may occur, including:  Headache.  Bleeding.  Infection.  Allergic reaction to medicines.  Damage to your nerves.  What happens before the procedure? Staying hydrated Follow instructions from your health care provider about hydration, which may include:  Up to 2 hours before the procedure - you may continue to drink clear liquids, such as water, clear fruit juice, black coffee, and plain tea.  Eating and drinking restrictions Follow instructions from your health care provider about eating and drinking, which may include:  8 hours before the procedure - stop eating heavy meals or foods such as meat, fried foods, or fatty foods.  6 hours before the procedure - stop eating light meals or foods, such as toast or cereal.  6 hours before the procedure - stop drinking milk or drinks that contain milk.  2 hours before the procedure - stop drinking clear liquids.  Medicine  You may be given medicines to lower anxiety.  Ask your health care provider about: ? Changing or stopping your regular medicines. This is especially important if you are taking diabetes medicines or blood thinners. ? Taking medicines such as aspirin and ibuprofen. These medicines can thin your blood. Do not take these medicines before your procedure if your health care provider instructs you not to. General instructions  Plan to have someone take you home from the hospital or clinic. What happens during the  procedure?  You may receive a medicine to help you relax (sedative).  You will be asked to lie on your abdomen.  The injection site will be cleaned.  A numbing medicine (local anesthetic) will be used to numb the injection site.  A needle will be inserted through your skin into the epidural space. You may feel some discomfort when this happens. An X-ray machine will be used to make sure the needle is put as close as possible to the affected nerve.  A steroid medicine and a local anesthetic will be injected into the epidural space.  The needle will be removed.  A bandage (dressing) will be put over the injection site. What happens after the procedure?  Your blood pressure, heart rate, breathing rate, and blood oxygen level will be monitored until the medicines you were given have worn off.  Your arm or leg may feel weak or numb for a few hours.  The injection site may feel sore.  Do not drive for 24 hours if you received a sedative. This information is not intended to replace advice given to you by your health care provider. Make sure you discuss any questions you have with your health care provider. Document Released: 03/06/2008 Document Revised: 05/11/2016 Document Reviewed: 03/15/2016 Elsevier Interactive Patient Education  [employer].

## 2018-06-25 ENCOUNTER — Other Ambulatory Visit: Payer: Self-pay | Admitting: Student in an Organized Health Care Education/Training Program

## 2018-06-25 DIAGNOSIS — M79662 Pain in left lower leg: Secondary | ICD-10-CM

## 2018-06-28 ENCOUNTER — Ambulatory Visit
Admission: RE | Admit: 2018-06-28 | Discharge: 2018-06-28 | Disposition: A | Discharge status: Home / Self Care | Payer: Medicare HMO | Source: Ambulatory Visit | Attending: Student in an Organized Health Care Education/Training Program | Admitting: Student in an Organized Health Care Education/Training Program

## 2018-06-28 DIAGNOSIS — M79662 Pain in left lower leg: Secondary | ICD-10-CM | POA: Diagnosis not present

## 2018-07-05 ENCOUNTER — Telehealth: Payer: Self-pay | Admitting: Student in an Organized Health Care Education/Training Program

## 2018-07-05 NOTE — Telephone Encounter (Signed)
Patient got test results and would like to know what DR. Lateef wants to do next

## 2018-09-25 ENCOUNTER — Encounter: Payer: Self-pay | Admitting: Hematology and Oncology

## 2018-10-29 ENCOUNTER — Telehealth: Payer: Self-pay | Admitting: Hematology and Oncology

## 2018-10-29 NOTE — Telephone Encounter (Signed)
VG PAL 12/9 moved f/u to 12/19. Left message. Schedule mailed.

## 2018-11-19 ENCOUNTER — Ambulatory Visit: Payer: Medicare HMO | Admitting: Hematology and Oncology

## 2018-11-28 NOTE — Progress Notes (Signed)
Patient Care Team: Rusty Aus, MD as PCP - General (Internal Medicine) Alphonsa Overall, MD as Consulting Physician (General Surgery) Nicholas Lose, MD as Consulting Physician (Hematology and Oncology) Thea Silversmith, MD as Consulting Physician (Radiation Oncology) Mauro Kaufmann, RN as Registered Nurse Rockwell Germany, RN as Registered Nurse Holley Bouche, NP (Inactive) as Nurse Practitioner (Nurse Practitioner) Sylvan Cheese, NP as Nurse Practitioner (Hematology and Oncology)  DIAGNOSIS:    ICD-10-CM   1. Malignant neoplasm of upper-outer quadrant of right breast in female, estrogen receptor positive ([place]) C50.411    Z17.0   2. Malignant neoplasm of upper-outer quadrant of right female breast, unspecified estrogen receptor status ([place]) C50.411 anastrozole (ARIMIDEX) 1 MG tablet    SUMMARY OF ONCOLOGIC HISTORY:   Breast cancer of upper-outer quadrant of right female breast ([place])   04/03/2015 Mammogram    LEFT breast: circumscribed oval mass in the outer quadrant measuring ~12 mm. This is posterior to a second oval mass that is unchanged from prior exams. 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 No chemotherapy Lindi Adie).    05/08/2015 Pathologic Stage    Stage IA: pT1c pN0    07/01/2015 - 08/04/2015  Radiation Therapy    Adjuvant RT Pablo Ledger): Right breast 50 Gy over 25 fractions    08/17/2015 -  Anti-estrogen oral therapy    Anastrozole 1 mg daily (Icis Budreau). Planned duration of therapy 5 years.    09/24/2015 Survivorship    Survivorship visit completed and copy of care plan provided to patient.     CHIEF COMPLIANT: Follow-up on anastrozole therapy   INTERVAL HISTORY: FABIAN COCA is a 77 y.o. with above-mentioned history of right breast cancer treated with lumpectomy and adjuvant radiation. She is currently on anastrozole, which began September 2016. I last saw the patient one year ago. Her most recent mammogram on 04/09/18 showed no evidence of malignancy. She presents to the clinic today and notes she is doing well and has recently lost weight. She reports occasional hot flashes. She will likely get bilateral knee replacement this year due to severe arthritis. All skin issues from radiation have resolved. She reviewed her medication list with me. Her most recent bone scan on 12/26/17 showed normal bone density.   REVIEW OF SYSTEMS:   Constitutional: Denies fevers, chills or abnormal weight loss (+) occasional hot flashes Eyes: Denies blurriness of vision Ears, nose, mouth, throat, and face: Denies mucositis or sore throat Respiratory: Denies cough, dyspnea or wheezes Cardiovascular: Denies palpitation, chest discomfort Gastrointestinal:  Denies nausea, heartburn or change in bowel habits Skin: Denies abnormal skin rashes MSJ: (+) arthritis in both knees Lymphatics: Denies new lymphadenopathy or easy bruising Neurological:Denies numbness, tingling or new weaknesses Behavioral/Psych: Mood is stable, no new changes  Extremities: No lower  extremity edema Breast: denies any pain or lumps or nodules in either breasts All other systems were reviewed with the patient and are negative.  I have reviewed the past medical history, past surgical history, social history and family history with  the patient and they are unchanged from previous note.  ALLERGIES:  is allergic to shellfish allergy; lisinopril; radiaplexrx [skin protectants, misc.]; adhesive [tape]; amoxicillin-pot clavulanate; barbiturates; celecoxib; codeine; loratadine; and tapentadol.  MEDICATIONS:  Current Outpatient Medications  Medication Sig Dispense Refill  . acetaminophen (TYLENOL) 500 MG tablet Take 1,000 mg by mouth every 6 (six) hours as needed for moderate pain or headache.    . ALPRAZolam (XANAX) 0.5 MG tablet Take 0.5 tablets (0.25 mg total) by mouth 2 (two) times daily as needed for anxiety. (Patient taking differently: Take 0.5 mg by mouth at bedtime. ) 60 tablet 0  . anastrozole (ARIMIDEX) 1 MG tablet Take 1 tablet (1 mg total) by mouth daily. 90 tablet 3  . Ascorbic Acid (VITAMIN C) 1000 MG tablet Take 1,000 mg by mouth daily.    Marland Kitchen aspirin EC 81 MG tablet Take by mouth.    . Cholecalciferol (VITAMIN D PO) Take 600 mg by mouth daily.    . hydrochlorothiazide (HYDRODIURIL) 25 MG tablet Take 25 mg by mouth as needed.  11  . levothyroxine (SYNTHROID, LEVOTHROID) 175 MCG tablet Take 175 mcg by mouth daily before breakfast.    . losartan (COZAAR) 50 MG tablet TAKE 1 TABLET BY MOUTH EVERY DAY    . Magnesium 500 MG CAPS Take 250 mg by mouth daily.     . meloxicam (MOBIC) 15 MG tablet Take 15 mg by mouth daily.    . Multiple Vitamins-Minerals (MULTIVITAMIN ADULT PO) Take 1 tablet by mouth daily.    Marland Kitchen oxybutynin (DITROPAN) 5 MG tablet Take 5 mg by mouth daily.     . Potassium 99 MG TABS Take 1 tablet (99 mg total) by mouth 2 (two) times daily. (Patient taking differently: Take 99 mg by mouth 2 (two) times daily. ) 330 each 0  . traMADol (ULTRAM) 50 MG tablet TAKE 1 TABLET (50 MG TOTAL) BY MOUTH 2 (TWO) TIMES DAILY AS NEEDED FOR PAIN  1  . vitamin B-12 (CYANOCOBALAMIN) 500 MCG tablet Take 500 mcg by mouth daily.     No current facility-administered medications for this visit.     PHYSICAL EXAMINATION: ECOG  PERFORMANCE STATUS: 0 - Asymptomatic  Vitals:   11/29/18 0858  BP: (!) 143/60  Pulse: (!) 101  Resp: 16  Temp: 98 F (36.7 C)  SpO2: 97%   Filed Weights   11/29/18 0858  Weight: 157 lb 3.2 oz (71.3 kg)    GENERAL:alert, no distress and comfortable SKIN: skin color, texture, turgor are normal, no rashes or significant lesions EYES: normal, Conjunctiva are pink and non-injected, sclera clear OROPHARYNX:no exudate, no erythema and lips, buccal mucosa, and tongue normal  NECK: supple, thyroid normal size, non-tender, without nodularity LYMPH:  no palpable lymphadenopathy in the cervical, axillary or inguinal LUNGS: clear to auscultation and percussion with normal breathing effort HEART: regular rate & rhythm and no murmurs and no lower extremity edema ABDOMEN:abdomen soft, non-tender and normal bowel sounds MUSCULOSKELETAL:no cyanosis of digits and no clubbing  NEURO: alert & oriented x 3 with fluent speech, no focal motor/sensory deficits EXTREMITIES: No lower extremity edema BREAST: No palpable masses or nodules in either right or left breasts. No palpable axillary supraclavicular or infraclavicular adenopathy no breast tenderness or nipple discharge. (  exam performed in the presence of a chaperone)  LABORATORY DATA:  I have reviewed the data as listed CMP Latest Ref Rng & Units 09/14/2017 04/22/2015 10/16/2013  Glucose 65 - 99 mg/dL 104(H) 110 130(H)  BUN 6 - 20 mg/dL 23(H) 15.1 6  Creatinine 0.44 - 1.00 mg/dL 0.72 0.7 0.70  Sodium 135 - 145 mmol/L 140 142 142  Potassium 3.5 - 5.1 mmol/L 4.0 4.2 3.7  Chloride 101 - 111 mmol/L 103 - 106  CO2 22 - 32 mmol/L _0 Calcium 8.9 - 10.3 mg/dL 9.4 9.0 9.0  Total Protein 6.4 - 8.3 g/dL - 6.7 -  Total Bilirubin 0.20 - 1.20 mg/dL - 0.50 -  Alkaline Phos 40 - 150 U/L - 67 -  AST 5 - 34 U/L - 13 -  ALT 0 - 55 U/L - 13 -    Lab Results  Component Value Date   WBC 4.6 09/14/2017   HGB 12.6 09/14/2017   HCT 37.0 09/14/2017   MCV  92.3 09/14/2017   PLT 250 09/14/2017   NEUTROABS 3.4 09/14/2017    ASSESSMENT & PLAN:  Breast cancer of upper-outer quadrant of right female breast Right breast lumpectomy 05/08/15: 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 Last bone density was done January 2019 at [facility] T score -0.8 normal  Anastrozole toxicities: Occasional hot flashes  Anxiety:On Xanax which is helping her tremendously Patient thinks that she needs bilateral knee replacement surgeries.  Breast cancer surveillance: 1.  Breast exam 11/29/2018: Benign 2. mammogram 04/09/2018: No evidence of malignancy breast density category C  Laminectomy was done in 2018  RTC in 1 year for follow up     No orders of the defined types were placed in this encounter.  The patient has a good understanding of the overall plan. she agrees with it. she will call with any problems that may develop before the next visit here.  Nicholas Lose, MD 11/29/2018   I, Cloyde Reams Dorshimer, am acting as scribe for Nicholas Lose, MD.  I have reviewed the above documentation for accuracy and completeness, and I agree with the above.

## 2018-11-28 NOTE — Assessment & Plan Note (Addendum)
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  Anastrozole toxicities: Occasional hot flashes  Anxiety:On Xanax which is helping her tremendously Patient thinks that she needs bilateral knee replacement surgeries.  Breast cancer surveillance: 1.  Breast exam 11/29/2018: Benign 2. mammogram 04/09/2018: No evidence of malignancy breast density category C  Laminectomy was done in 2018  RTC in 1 year for follow up

## 2018-11-29 ENCOUNTER — Inpatient Hospital Stay: Payer: Medicare HMO | Attending: Hematology and Oncology | Admitting: Hematology and Oncology

## 2018-11-29 ENCOUNTER — Telehealth: Payer: Self-pay | Admitting: Hematology and Oncology

## 2018-11-29 DIAGNOSIS — Z79899 Other long term (current) drug therapy: Secondary | ICD-10-CM | POA: Diagnosis not present

## 2018-11-29 DIAGNOSIS — Z170 Estrogen receptor positive status [ER+]: Secondary | ICD-10-CM | POA: Insufficient documentation

## 2018-11-29 DIAGNOSIS — C50411 Malignant neoplasm of upper-outer quadrant of right female breast: Secondary | ICD-10-CM | POA: Insufficient documentation

## 2018-11-29 DIAGNOSIS — Z7982 Long term (current) use of aspirin: Secondary | ICD-10-CM | POA: Insufficient documentation

## 2018-11-29 DIAGNOSIS — F419 Anxiety disorder, unspecified: Secondary | ICD-10-CM | POA: Diagnosis not present

## 2018-11-29 DIAGNOSIS — Z79811 Long term (current) use of aromatase inhibitors: Secondary | ICD-10-CM | POA: Diagnosis not present

## 2018-11-29 DIAGNOSIS — Z923 Personal history of irradiation: Secondary | ICD-10-CM | POA: Insufficient documentation

## 2018-11-29 MED ORDER — ANASTROZOLE 1 MG PO TABS: 1.0000 mg | ORAL_TABLET | Freq: Every day | ORAL | 3 refills | Status: DC

## 2018-11-29 NOTE — Telephone Encounter (Signed)
Gave avs and calendar ° °

## 2018-12-27 DIAGNOSIS — R7989 Other specified abnormal findings of blood chemistry: Secondary | ICD-10-CM | POA: Insufficient documentation

## 2019-03-13 ENCOUNTER — Other Ambulatory Visit: Payer: Self-pay | Admitting: Hematology and Oncology

## 2019-03-13 DIAGNOSIS — Z1231 Encounter for screening mammogram for malignant neoplasm of breast: Secondary | ICD-10-CM

## 2019-05-08 ENCOUNTER — Other Ambulatory Visit: Payer: Self-pay | Admitting: Hematology and Oncology

## 2019-05-08 ENCOUNTER — Other Ambulatory Visit: Payer: Self-pay | Admitting: Internal Medicine

## 2019-05-08 DIAGNOSIS — Z853 Personal history of malignant neoplasm of breast: Secondary | ICD-10-CM

## 2019-05-15 ENCOUNTER — Other Ambulatory Visit: Payer: Self-pay

## 2019-05-15 ENCOUNTER — Ambulatory Visit
Admission: RE | Admit: 2019-05-15 | Discharge: 2019-05-15 | Disposition: A | Discharge status: Home / Self Care | Payer: Medicare HMO | Source: Ambulatory Visit | Attending: Hematology and Oncology | Admitting: Hematology and Oncology

## 2019-05-15 DIAGNOSIS — Z853 Personal history of malignant neoplasm of breast: Secondary | ICD-10-CM

## 2019-08-10 NOTE — Discharge Instructions (Signed)
°  Instructions after Total Knee Replacement ° ° Keyleigh Manninen P. Sriansh Farra, Jr., M.D.    ° Dept. of Orthopaedics & Sports Medicine ° [facility] ° [address] ° [place], [place]  [address] ° Phone: [phone]   Fax: [phone] ° °  °DIET: °• Drink plenty of non-alcoholic fluids. °• Resume your normal diet. Include foods high in fiber. ° °ACTIVITY:  °• You may use crutches or a walker with weight-bearing as tolerated, unless instructed otherwise. °• You may be weaned off of the walker or crutches by your Physical Therapist.  °• Do NOT place pillows under the knee. Anything placed under the knee could limit your ability to straighten the knee.   °• Continue doing gentle exercises. Exercising will reduce the pain and swelling, increase motion, and prevent muscle weakness.   °• Please continue to use the TED compression stockings for 6 weeks. You may remove the stockings at night, but should reapply them in the morning. °• Do not drive or operate any equipment until instructed. ° °WOUND CARE:  °• Continue to use the PolarCare or ice packs periodically to reduce pain and swelling. °• You may bathe or shower after the staples are removed at the first office visit following surgery. ° °MEDICATIONS: °• You may resume your regular medications. °• Please take the pain medication as prescribed on the medication. °• Do not take pain medication on an empty stomach. °• You have been given a prescription for a blood thinner (Lovenox or Coumadin). Please take the medication as instructed. (NOTE: After completing a 2 week course of Lovenox, take one Enteric-coated aspirin once a day. This along with elevation will help reduce the possibility of phlebitis in your operated leg.) °• Do not drive or drink alcoholic beverages when taking pain medications. ° °CALL THE OFFICE FOR: °• Temperature above 101 degrees °• Excessive bleeding or drainage on the dressing. °• Excessive swelling, coldness, or paleness of the toes. °• Persistent  nausea and vomiting. ° °FOLLOW-UP:  °• You should have an appointment to return to the office in 10-14 days after surgery. °• Arrangements have been made for continuation of Physical Therapy (either home therapy or outpatient therapy). °  °

## 2019-08-16 ENCOUNTER — Other Ambulatory Visit: Payer: Self-pay

## 2019-08-16 ENCOUNTER — Encounter
Admission: RE | Admit: 2019-08-16 | Discharge: 2019-08-16 | Disposition: A | Discharge status: Home / Self Care | Payer: Medicare HMO | Source: Ambulatory Visit | Attending: Orthopedic Surgery | Admitting: Orthopedic Surgery

## 2019-08-16 DIAGNOSIS — Z01812 Encounter for preprocedural laboratory examination: Secondary | ICD-10-CM | POA: Diagnosis not present

## 2019-08-16 HISTORY — DX: Unspecified asthma, uncomplicated: J45.909

## 2019-08-16 HISTORY — DX: Family history of other specified conditions: Z84.89

## 2019-08-16 LAB — COMPREHENSIVE METABOLIC PANEL
ALT: 18 U/L (ref 0–44)
AST: 19 U/L (ref 15–41)
Albumin: 3.6 g/dL (ref 3.5–5.0)
Alkaline Phosphatase: 54 U/L (ref 38–126)
Anion gap: 10 (ref 5–15)
BUN: 34 mg/dL — ABNORMAL HIGH (ref 8–23)
CO2: 28 mmol/L (ref 22–32)
Calcium: 8.9 mg/dL (ref 8.9–10.3)
Chloride: 101 mmol/L (ref 98–111)
Creatinine, Ser: 0.91 mg/dL (ref 0.44–1.00)
GFR calc Af Amer: >60 mL/min (ref >60)
GFR calc non Af Amer: >60 mL/min (ref >60)
Glucose, Bld: 107 mg/dL — ABNORMAL HIGH (ref 70–99)
Potassium: 3.4 mmol/L — ABNORMAL LOW (ref 3.5–5.1)
Sodium: 139 mmol/L (ref 135–145)
Total Bilirubin: 0.5 mg/dL (ref 0.3–1.2)
Total Protein: 6.6 g/dL (ref 6.5–8.1)

## 2019-08-16 LAB — TYPE AND SCREEN
ABO/RH(D): A NEG
Antibody Screen: NEGATIVE

## 2019-08-16 LAB — URINALYSIS, ROUTINE W REFLEX MICROSCOPIC
Bacteria, UA: NONE SEEN
Bilirubin Urine: NEGATIVE
Glucose, UA: NEGATIVE mg/dL
Hgb urine dipstick: NEGATIVE
Ketones, ur: NEGATIVE mg/dL
Nitrite: NEGATIVE
Protein, ur: NEGATIVE mg/dL
Specific Gravity, Urine: 1.017 (ref 1.005–1.030)
pH: 5 (ref 5.0–8.0)

## 2019-08-16 LAB — APTT: aPTT: 34 seconds (ref 24–36)

## 2019-08-16 LAB — PROTIME-INR
INR: 1 (ref 0.8–1.2)
Prothrombin Time: 13.4 seconds (ref 11.4–15.2)

## 2019-08-16 LAB — SURGICAL PCR SCREEN
MRSA, PCR: NEGATIVE
Staphylococcus aureus: NEGATIVE

## 2019-08-16 LAB — CBC
HCT: 37 % (ref 36.0–46.0)
Hemoglobin: 11.6 g/dL — ABNORMAL LOW (ref 12.0–15.0)
MCH: 30.6 pg (ref 26.0–34.0)
MCHC: 31.4 g/dL (ref 30.0–36.0)
MCV: 97.6 fL (ref 80.0–100.0)
Platelets: 278 10*3/uL (ref 150–400)
RBC: 3.79 MIL/uL — ABNORMAL LOW (ref 3.87–5.11)
RDW: 14.4 % (ref 11.5–15.5)
WBC: 6.5 10*3/uL (ref 4.0–10.5)
nRBC: 0 % (ref 0.0–0.2)

## 2019-08-16 LAB — C-REACTIVE PROTEIN: CRP: <0.8 mg/dL (ref <1.0)

## 2019-08-16 LAB — SEDIMENTATION RATE: Sed Rate: 23 mm/hr (ref 0–30)

## 2019-08-16 NOTE — Patient Instructions (Signed)
Your procedure is scheduled on: 08-26-19 MONDAY Report to Same Day Surgery 2nd floor medical mall [facility] Entrance-take elevator on left to 2nd floor.  Check in with surgery information desk.) To find out your arrival time please call [phone] between 1PM - 3PM on 08-23-19 FRIDAY  Remember: Instructions that are not followed completely may result in serious medical risk, up to and including death, or upon the discretion of your surgeon and anesthesiologist your surgery may need to be rescheduled.    _x___ 1. Do not eat food after midnight the night before your procedure. NO GUM OR CANDY AFTER MIDNIGHT. You may drink clear liquids up to 2 hours before you are scheduled to arrive at the hospital for your procedure.  Do not drink clear liquids within 2 hours of your scheduled arrival to the hospital.  Clear liquids include  --Water or Apple juice without pulp  --Gatorade  --Black Coffee or Clear Tea (No milk, no creamers, do not add anything to the coffee or Tea)   ____Ensure clear carbohydrate drink on the way to the hospital for bariatric patients  _X___Ensure clear carbohydrate drink 3 hours before surgery.     __x__ 2. No Alcohol for 24 hours before or after surgery.   __x__3. No Smoking or e-cigarettes for 24 prior to surgery.  Do not use any chewable tobacco products for at least 6 hour prior to surgery   ____  4. Bring all medications with you on the day of surgery if instructed.    __x__ 5. Notify your doctor if there is any change in your medical condition     (cold, fever, infections).    x___6. On the morning of surgery brush your teeth with toothpaste and water.  You may rinse your mouth with mouth wash if you wish.  Do not swallow any toothpaste or mouthwash.   Do not wear jewelry, make-up, hairpins, clips or nail polish.  Do not wear lotions, powders, or perfumes.   Do not shave 48 hours prior to surgery. Men may shave face and neck.  Do not bring valuables to  the hospital.    [facility] is not responsible for any belongings or valuables.               Contacts, dentures or bridgework may not be worn into surgery.  Leave your suitcase in the car. After surgery it may be brought to your room.  For patients admitted to the hospital, discharge time is determined by your treatment team.  _  Patients discharged the day of surgery will not be allowed to drive home.  You will need someone to drive you home and stay with you the night of your procedure.    Please read over the following fact sheets that you were given:   [facility] Preparing for Surgery and or MRSA Information   _x___ TAKE THE FOLLOWING MEDICATION THE MORNING OF SURGERY WITH A SMALL SIP OF WATER. These include:  1. LEVOTHYROXINE (SYNTHROID)  2. MAGNESIUM  3. OXYBUTYNIN (DITRPOPAN)  4. ARIMIDEX (ANASTROZOLE)  5.  6.  ____Fleets enema or Magnesium Citrate as directed.   _x___ Use CHG Soap or sage wipes as directed on instruction sheet   ____ Use inhalers on the day of surgery and bring to hospital day of surgery  ____ Stop Metformin and Janumet 2 days prior to surgery.    ____ Take 1/2 of usual insulin dose the night before surgery and none on the morning surgery.  _x___ Follow recommendations from Cardiologist, Pulmonologist or PCP regarding stopping Aspirin, Coumadin, Plavix ,Eliquis, Effient, or Pradaxa, and Pletal-STOP YOUR ASPIRIN 7 DAYS PRIOR TO SURGERY  X____Stop Anti-inflammatories such as Advil, Aleve, Ibuprofen, Motrin, Naproxen, Naprosyn, Goodies powders or aspirin products. OK to take Tylenol OR TRAMADOL IF NEEDED   ____ Stop supplements until after surgery.    ____ Bring C-Pap to the hospital.

## 2019-08-17 LAB — URINE CULTURE
Culture: <10000 — AB
Special Requests: NORMAL

## 2019-08-19 LAB — IGE: IgE (Immunoglobulin E), Serum: 379 IU/mL (ref 6–495)

## 2019-08-22 ENCOUNTER — Other Ambulatory Visit
Admission: RE | Admit: 2019-08-22 | Discharge: 2019-08-22 | Disposition: A | Discharge status: Home / Self Care | Payer: Medicare HMO | Source: Ambulatory Visit | Attending: Orthopedic Surgery | Admitting: Orthopedic Surgery

## 2019-08-22 ENCOUNTER — Other Ambulatory Visit: Payer: Self-pay

## 2019-08-22 DIAGNOSIS — Z20828 Contact with and (suspected) exposure to other viral communicable diseases: Secondary | ICD-10-CM | POA: Diagnosis not present

## 2019-08-22 DIAGNOSIS — Z01812 Encounter for preprocedural laboratory examination: Secondary | ICD-10-CM | POA: Diagnosis present

## 2019-08-22 LAB — SARS CORONAVIRUS 2 (TAT 6-24 HRS): SARS Coronavirus 2: NEGATIVE

## 2019-08-25 ENCOUNTER — Encounter: Payer: Self-pay | Admitting: Orthopedic Surgery

## 2019-08-25 MED ORDER — TRANEXAMIC ACID-NACL 1000-0.7 MG/100ML-% IV SOLN
1000.0000 mg | INTRAVENOUS | Status: AC
  Administered 2019-08-26: 1000 mg via INTRAVENOUS
  Filled 2019-08-25: qty 100

## 2019-08-25 MED ORDER — CLINDAMYCIN PHOSPHATE 900 MG/50ML IV SOLN
900.0000 mg | INTRAVENOUS | Status: AC
  Administered 2019-08-26: 900 mg via INTRAVENOUS

## 2019-08-26 ENCOUNTER — Inpatient Hospital Stay: Payer: Medicare HMO

## 2019-08-26 ENCOUNTER — Inpatient Hospital Stay: Payer: Medicare HMO | Admitting: Anesthesiology

## 2019-08-26 ENCOUNTER — Other Ambulatory Visit: Payer: Self-pay

## 2019-08-26 ENCOUNTER — Inpatient Hospital Stay
Admission: RE | Admit: 2019-08-26 | Discharge: 2019-08-28 | DRG: 470 | Disposition: A | Discharge status: Home Health | Payer: Medicare HMO | Attending: Orthopedic Surgery | Admitting: Orthopedic Surgery

## 2019-08-26 ENCOUNTER — Encounter: Payer: Self-pay | Admitting: *Deleted

## 2019-08-26 ENCOUNTER — Encounter
Admission: RE | Disposition: A | Discharge status: Home Health | Payer: Self-pay | Source: Home / Self Care | Attending: Orthopedic Surgery

## 2019-08-26 DIAGNOSIS — Z8249 Family history of ischemic heart disease and other diseases of the circulatory system: Secondary | ICD-10-CM | POA: Diagnosis not present

## 2019-08-26 DIAGNOSIS — Z79899 Other long term (current) drug therapy: Secondary | ICD-10-CM | POA: Diagnosis not present

## 2019-08-26 DIAGNOSIS — Z79890 Hormone replacement therapy (postmenopausal): Secondary | ICD-10-CM | POA: Diagnosis not present

## 2019-08-26 DIAGNOSIS — Z79811 Long term (current) use of aromatase inhibitors: Secondary | ICD-10-CM | POA: Diagnosis not present

## 2019-08-26 DIAGNOSIS — Z923 Personal history of irradiation: Secondary | ICD-10-CM | POA: Diagnosis not present

## 2019-08-26 DIAGNOSIS — Z886 Allergy status to analgesic agent status: Secondary | ICD-10-CM

## 2019-08-26 DIAGNOSIS — M1711 Unilateral primary osteoarthritis, right knee: Secondary | ICD-10-CM | POA: Diagnosis present

## 2019-08-26 DIAGNOSIS — Z880 Allergy status to penicillin: Secondary | ICD-10-CM | POA: Diagnosis not present

## 2019-08-26 DIAGNOSIS — Z853 Personal history of malignant neoplasm of breast: Secondary | ICD-10-CM

## 2019-08-26 DIAGNOSIS — Z885 Allergy status to narcotic agent status: Secondary | ICD-10-CM | POA: Diagnosis not present

## 2019-08-26 DIAGNOSIS — I10 Essential (primary) hypertension: Secondary | ICD-10-CM | POA: Diagnosis present

## 2019-08-26 DIAGNOSIS — Z7982 Long term (current) use of aspirin: Secondary | ICD-10-CM | POA: Diagnosis not present

## 2019-08-26 DIAGNOSIS — J45909 Unspecified asthma, uncomplicated: Secondary | ICD-10-CM | POA: Diagnosis present

## 2019-08-26 DIAGNOSIS — Z96651 Presence of right artificial knee joint: Secondary | ICD-10-CM

## 2019-08-26 DIAGNOSIS — E782 Mixed hyperlipidemia: Secondary | ICD-10-CM | POA: Diagnosis present

## 2019-08-26 DIAGNOSIS — Z888 Allergy status to other drugs, medicaments and biological substances status: Secondary | ICD-10-CM

## 2019-08-26 DIAGNOSIS — Z96659 Presence of unspecified artificial knee joint: Secondary | ICD-10-CM

## 2019-08-26 DIAGNOSIS — E039 Hypothyroidism, unspecified: Secondary | ICD-10-CM | POA: Diagnosis present

## 2019-08-26 DIAGNOSIS — Z825 Family history of asthma and other chronic lower respiratory diseases: Secondary | ICD-10-CM

## 2019-08-26 DIAGNOSIS — Z8349 Family history of other endocrine, nutritional and metabolic diseases: Secondary | ICD-10-CM

## 2019-08-26 HISTORY — PX: KNEE ARTHROPLASTY: SHX992

## 2019-08-26 SURGERY — ARTHROPLASTY, KNEE, TOTAL, USING IMAGELESS COMPUTER-ASSISTED NAVIGATION
Anesthesia: Spinal | Site: Knee | Laterality: Right

## 2019-08-26 MED ORDER — VITAMIN D 25 MCG (1000 UNIT) PO TABS
1000.0000 ug | ORAL_TABLET | Freq: Every day | ORAL | Status: DC
  Administered 2019-08-27 – 2019-08-28 (×2): 1000 ug via ORAL
  Filled 2019-08-26 (×3): qty 1

## 2019-08-26 MED ORDER — TRANEXAMIC ACID-NACL 1000-0.7 MG/100ML-% IV SOLN
1000.0000 mg | Freq: Once | INTRAVENOUS | Status: AC
  Administered 2019-08-26: 1000 mg via INTRAVENOUS
  Filled 2019-08-26: qty 100

## 2019-08-26 MED ORDER — LIDOCAINE HCL (PF) 2 % IJ SOLN
INTRAMUSCULAR | Status: AC
  Filled 2019-08-26: qty 10

## 2019-08-26 MED ORDER — FAMOTIDINE 20 MG PO TABS
20.0000 mg | ORAL_TABLET | Freq: Once | ORAL | Status: AC
  Administered 2019-08-26: 11:00:00 20 mg via ORAL

## 2019-08-26 MED ORDER — SODIUM CHLORIDE 0.9 % IV SOLN
INTRAVENOUS | Status: DC
  Administered 2019-08-26: 17:00:00 via INTRAVENOUS

## 2019-08-26 MED ORDER — FENTANYL CITRATE (PF) 100 MCG/2ML IJ SOLN: 25.0000 ug | INTRAMUSCULAR | Status: DC | PRN

## 2019-08-26 MED ORDER — LOSARTAN POTASSIUM 50 MG PO TABS
50.0000 mg | ORAL_TABLET | ORAL | Status: DC
  Administered 2019-08-26 – 2019-08-28 (×3): 50 mg via ORAL
  Filled 2019-08-26 (×3): qty 1

## 2019-08-26 MED ORDER — GABAPENTIN 300 MG PO CAPS
300.0000 mg | ORAL_CAPSULE | Freq: Once | ORAL | Status: AC
  Administered 2019-08-26: 11:00:00 300 mg via ORAL

## 2019-08-26 MED ORDER — LOPERAMIDE HCL 2 MG PO CAPS
4.0000 mg | ORAL_CAPSULE | Freq: Every day | ORAL | Status: DC | PRN
  Filled 2019-08-26: qty 2

## 2019-08-26 MED ORDER — PHENOL 1.4 % MT LIQD
1.0000 | OROMUCOSAL | Status: DC | PRN
  Filled 2019-08-26: qty 177

## 2019-08-26 MED ORDER — LACTATED RINGERS IV SOLN
INTRAVENOUS | Status: DC
  Administered 2019-08-26 (×2): via INTRAVENOUS

## 2019-08-26 MED ORDER — FLEET ENEMA 7-19 GM/118ML RE ENEM: 1.0000 | ENEMA | Freq: Once | RECTAL | Status: DC | PRN

## 2019-08-26 MED ORDER — BISACODYL 10 MG RE SUPP: 10.0000 mg | Freq: Every day | RECTAL | Status: DC | PRN

## 2019-08-26 MED ORDER — ANASTROZOLE 1 MG PO TABS
1.0000 mg | ORAL_TABLET | ORAL | Status: DC
  Administered 2019-08-27 – 2019-08-28 (×2): 1 mg via ORAL
  Filled 2019-08-26 (×2): qty 1

## 2019-08-26 MED ORDER — GABAPENTIN 300 MG PO CAPS
300.0000 mg | ORAL_CAPSULE | Freq: Every day | ORAL | Status: DC
  Administered 2019-08-26 – 2019-08-27 (×2): 300 mg via ORAL
  Filled 2019-08-26 (×2): qty 1

## 2019-08-26 MED ORDER — BUPIVACAINE HCL (PF) 0.5 % IJ SOLN
INTRAMUSCULAR | Status: AC
  Filled 2019-08-26: qty 10

## 2019-08-26 MED ORDER — METOCLOPRAMIDE HCL 10 MG PO TABS
10.0000 mg | ORAL_TABLET | Freq: Three times a day (TID) | ORAL | Status: DC
  Administered 2019-08-26 – 2019-08-28 (×7): 10 mg via ORAL
  Filled 2019-08-26 (×7): qty 1

## 2019-08-26 MED ORDER — CELECOXIB 200 MG PO CAPS
ORAL_CAPSULE | ORAL | Status: AC
  Filled 2019-08-26: qty 2

## 2019-08-26 MED ORDER — CLINDAMYCIN PHOSPHATE 600 MG/50ML IV SOLN
600.0000 mg | Freq: Four times a day (QID) | INTRAVENOUS | Status: AC
  Administered 2019-08-26 – 2019-08-27 (×3): 600 mg via INTRAVENOUS
  Filled 2019-08-26 (×4): qty 50

## 2019-08-26 MED ORDER — ONDANSETRON HCL 4 MG/2ML IJ SOLN: 4.0000 mg | Freq: Four times a day (QID) | INTRAMUSCULAR | Status: DC | PRN

## 2019-08-26 MED ORDER — GABAPENTIN 300 MG PO CAPS
ORAL_CAPSULE | ORAL | Status: AC
  Administered 2019-08-26: 300 mg via ORAL
  Filled 2019-08-26: qty 1

## 2019-08-26 MED ORDER — CLINDAMYCIN PHOSPHATE 900 MG/50ML IV SOLN
INTRAVENOUS | Status: AC
  Filled 2019-08-26: qty 50

## 2019-08-26 MED ORDER — ACETAMINOPHEN 10 MG/ML IV SOLN
INTRAVENOUS | Status: DC | PRN
  Administered 2019-08-26: 1000 mg via INTRAVENOUS

## 2019-08-26 MED ORDER — BUPIVACAINE HCL (PF) 0.5 % IJ SOLN
INTRAMUSCULAR | Status: DC | PRN
  Administered 2019-08-26: 2.5 mL

## 2019-08-26 MED ORDER — BUPIVACAINE HCL (PF) 0.25 % IJ SOLN
INTRAMUSCULAR | Status: DC | PRN
  Administered 2019-08-26: 60 mL

## 2019-08-26 MED ORDER — ENSURE PRE-SURGERY PO LIQD
296.0000 mL | Freq: Once | ORAL | Status: DC
  Filled 2019-08-26: qty 296

## 2019-08-26 MED ORDER — FAMOTIDINE 20 MG PO TABS
ORAL_TABLET | ORAL | Status: AC
  Filled 2019-08-26: qty 1

## 2019-08-26 MED ORDER — VITAMIN B-12 100 MCG PO TABS
500.0000 ug | ORAL_TABLET | Freq: Every day | ORAL | Status: DC
  Administered 2019-08-26 – 2019-08-28 (×3): 500 ug via ORAL
  Filled 2019-08-26 (×3): qty 5

## 2019-08-26 MED ORDER — SODIUM CHLORIDE 0.9 % IV SOLN
INTRAVENOUS | Status: DC | PRN
  Administered 2019-08-26: 30 ug/min via INTRAVENOUS

## 2019-08-26 MED ORDER — MIDAZOLAM HCL 5 MG/5ML IJ SOLN
INTRAMUSCULAR | Status: DC | PRN
  Administered 2019-08-26: 2 mg via INTRAVENOUS

## 2019-08-26 MED ORDER — MENTHOL 3 MG MT LOZG
1.0000 | LOZENGE | OROMUCOSAL | Status: DC | PRN
  Filled 2019-08-26: qty 9

## 2019-08-26 MED ORDER — OXYCODONE HCL 5 MG PO TABS: 10.0000 mg | ORAL_TABLET | ORAL | Status: DC | PRN

## 2019-08-26 MED ORDER — DIPHENHYDRAMINE HCL 12.5 MG/5ML PO ELIX: 12.5000 mg | ORAL_SOLUTION | ORAL | Status: DC | PRN

## 2019-08-26 MED ORDER — FENTANYL CITRATE (PF) 100 MCG/2ML IJ SOLN
INTRAMUSCULAR | Status: AC
  Filled 2019-08-26: qty 2

## 2019-08-26 MED ORDER — DEXAMETHASONE SODIUM PHOSPHATE 10 MG/ML IJ SOLN
8.0000 mg | Freq: Once | INTRAMUSCULAR | Status: AC
  Administered 2019-08-26: 11:00:00 8 mg via INTRAVENOUS

## 2019-08-26 MED ORDER — OXYBUTYNIN CHLORIDE 5 MG PO TABS
5.0000 mg | ORAL_TABLET | ORAL | Status: DC
  Administered 2019-08-27 – 2019-08-28 (×2): 5 mg via ORAL
  Filled 2019-08-26 (×2): qty 1

## 2019-08-26 MED ORDER — POLYVINYL ALCOHOL 1.4 % OP SOLN
1.0000 [drp] | Freq: Three times a day (TID) | OPHTHALMIC | Status: DC | PRN
  Filled 2019-08-26: qty 15

## 2019-08-26 MED ORDER — SODIUM CHLORIDE 0.9 % IV SOLN
INTRAVENOUS | Status: DC | PRN
  Administered 2019-08-26: 60 mL

## 2019-08-26 MED ORDER — TETRACAINE HCL 1 % IJ SOLN
INTRAMUSCULAR | Status: DC | PRN
  Administered 2019-08-26: 5 mg via INTRASPINAL

## 2019-08-26 MED ORDER — METOCLOPRAMIDE HCL 10 MG PO TABS: 5.0000 mg | ORAL_TABLET | Freq: Three times a day (TID) | ORAL | Status: DC | PRN

## 2019-08-26 MED ORDER — HYDROMORPHONE HCL 1 MG/ML IJ SOLN: 0.5000 mg | INTRAMUSCULAR | Status: DC | PRN

## 2019-08-26 MED ORDER — TRAMADOL HCL 50 MG PO TABS
50.0000 mg | ORAL_TABLET | ORAL | Status: DC | PRN
  Administered 2019-08-27 (×2): 100 mg via ORAL
  Filled 2019-08-26 (×2): qty 2

## 2019-08-26 MED ORDER — ACETAMINOPHEN 10 MG/ML IV SOLN
INTRAVENOUS | Status: AC
  Filled 2019-08-26: qty 100

## 2019-08-26 MED ORDER — ONDANSETRON HCL 4 MG PO TABS: 4.0000 mg | ORAL_TABLET | Freq: Four times a day (QID) | ORAL | Status: DC | PRN

## 2019-08-26 MED ORDER — MAGNESIUM OXIDE 400 (241.3 MG) MG PO TABS
400.0000 mg | ORAL_TABLET | ORAL | Status: DC
  Administered 2019-08-27 – 2019-08-28 (×2): 400 mg via ORAL
  Filled 2019-08-26 (×2): qty 1

## 2019-08-26 MED ORDER — ACETAMINOPHEN 325 MG PO TABS
325.0000 mg | ORAL_TABLET | Freq: Four times a day (QID) | ORAL | Status: DC | PRN
  Administered 2019-08-26: 325 mg via ORAL
  Filled 2019-08-26 (×2): qty 2

## 2019-08-26 MED ORDER — LEVOTHYROXINE SODIUM 50 MCG PO TABS
175.0000 ug | ORAL_TABLET | Freq: Every day | ORAL | Status: DC
  Administered 2019-08-27 – 2019-08-28 (×2): 175 ug via ORAL
  Filled 2019-08-26 (×2): qty 1

## 2019-08-26 MED ORDER — FENTANYL CITRATE (PF) 100 MCG/2ML IJ SOLN
INTRAMUSCULAR | Status: DC | PRN
  Administered 2019-08-26: 25 ug via INTRAVENOUS
  Administered 2019-08-26: 50 ug via INTRAVENOUS
  Administered 2019-08-26: 25 ug via INTRAVENOUS

## 2019-08-26 MED ORDER — ACETAMINOPHEN 10 MG/ML IV SOLN
1000.0000 mg | Freq: Four times a day (QID) | INTRAVENOUS | Status: AC
  Administered 2019-08-26 – 2019-08-27 (×3): 1000 mg via INTRAVENOUS
  Filled 2019-08-26 (×4): qty 100

## 2019-08-26 MED ORDER — ALUM & MAG HYDROXIDE-SIMETH 200-200-20 MG/5ML PO SUSP: 30.0000 mL | ORAL | Status: DC | PRN

## 2019-08-26 MED ORDER — SENNOSIDES-DOCUSATE SODIUM 8.6-50 MG PO TABS
1.0000 | ORAL_TABLET | Freq: Two times a day (BID) | ORAL | Status: DC
  Administered 2019-08-26: 1 via ORAL
  Filled 2019-08-26 (×3): qty 1

## 2019-08-26 MED ORDER — PROPOFOL 10 MG/ML IV BOLUS
INTRAVENOUS | Status: DC | PRN
  Administered 2019-08-26 (×2): 21 mg via INTRAVENOUS

## 2019-08-26 MED ORDER — FAMOTIDINE 20 MG PO TABS
ORAL_TABLET | ORAL | Status: AC
  Administered 2019-08-26: 20 mg via ORAL
  Filled 2019-08-26: qty 1

## 2019-08-26 MED ORDER — MIDAZOLAM HCL 2 MG/2ML IJ SOLN
INTRAMUSCULAR | Status: AC
  Filled 2019-08-26: qty 2

## 2019-08-26 MED ORDER — OXYCODONE HCL 5 MG PO TABS
5.0000 mg | ORAL_TABLET | ORAL | Status: DC | PRN
  Administered 2019-08-26 – 2019-08-28 (×4): 5 mg via ORAL
  Filled 2019-08-26 (×4): qty 1

## 2019-08-26 MED ORDER — ENOXAPARIN SODIUM 30 MG/0.3ML ~~LOC~~ SOLN
30.0000 mg | Freq: Two times a day (BID) | SUBCUTANEOUS | Status: DC
  Administered 2019-08-27 – 2019-08-28 (×3): 30 mg via SUBCUTANEOUS
  Filled 2019-08-26 (×3): qty 0.3

## 2019-08-26 MED ORDER — CELECOXIB 200 MG PO CAPS: 400.0000 mg | ORAL_CAPSULE | Freq: Once | ORAL | Status: DC

## 2019-08-26 MED ORDER — PROPOFOL 500 MG/50ML IV EMUL
INTRAVENOUS | Status: DC | PRN
  Administered 2019-08-26: 70 ug/kg/min via INTRAVENOUS

## 2019-08-26 MED ORDER — KETAMINE HCL 50 MG/ML IJ SOLN
INTRAMUSCULAR | Status: DC | PRN
  Administered 2019-08-26: 25 mg via INTRAMUSCULAR

## 2019-08-26 MED ORDER — PANTOPRAZOLE SODIUM 40 MG PO TBEC
40.0000 mg | DELAYED_RELEASE_TABLET | Freq: Two times a day (BID) | ORAL | Status: DC
  Administered 2019-08-26 – 2019-08-28 (×4): 40 mg via ORAL
  Filled 2019-08-26 (×5): qty 1

## 2019-08-26 MED ORDER — DEXAMETHASONE SODIUM PHOSPHATE 10 MG/ML IJ SOLN
INTRAMUSCULAR | Status: AC
  Administered 2019-08-26: 8 mg via INTRAVENOUS
  Filled 2019-08-26: qty 1

## 2019-08-26 MED ORDER — MAGNESIUM HYDROXIDE 400 MG/5ML PO SUSP
30.0000 mL | Freq: Every day | ORAL | Status: DC
  Administered 2019-08-26: 18:00:00 30 mL via ORAL
  Filled 2019-08-26 (×2): qty 30

## 2019-08-26 MED ORDER — HYDROCHLOROTHIAZIDE 25 MG PO TABS
25.0000 mg | ORAL_TABLET | ORAL | Status: DC
  Administered 2019-08-27: 25 mg via ORAL
  Filled 2019-08-26: qty 1

## 2019-08-26 MED ORDER — METOCLOPRAMIDE HCL 5 MG/ML IJ SOLN: 5.0000 mg | Freq: Three times a day (TID) | INTRAMUSCULAR | Status: DC | PRN

## 2019-08-26 MED ORDER — ONDANSETRON HCL 4 MG/2ML IJ SOLN: 4.0000 mg | Freq: Once | INTRAMUSCULAR | Status: DC | PRN

## 2019-08-26 MED ORDER — ADULT MULTIVITAMIN W/MINERALS CH
1.0000 | ORAL_TABLET | Freq: Every day | ORAL | Status: DC
  Administered 2019-08-26 – 2019-08-28 (×3): 1 via ORAL
  Filled 2019-08-26 (×3): qty 1

## 2019-08-26 MED ORDER — PHENYLEPHRINE HCL (PRESSORS) 10 MG/ML IV SOLN
INTRAVENOUS | Status: AC
  Filled 2019-08-26: qty 1

## 2019-08-26 MED ORDER — CHLORHEXIDINE GLUCONATE 4 % EX LIQD: 60.0000 mL | Freq: Once | CUTANEOUS | Status: DC

## 2019-08-26 MED ORDER — PROPOFOL 500 MG/50ML IV EMUL
INTRAVENOUS | Status: AC
  Filled 2019-08-26: qty 50

## 2019-08-26 MED ORDER — FERROUS SULFATE 325 (65 FE) MG PO TABS
325.0000 mg | ORAL_TABLET | Freq: Two times a day (BID) | ORAL | Status: DC
  Administered 2019-08-26 – 2019-08-28 (×4): 325 mg via ORAL
  Filled 2019-08-26 (×4): qty 1

## 2019-08-26 MED ORDER — POTASSIUM 99 MG PO TABS
1.0000 | ORAL_TABLET | Freq: Every day | ORAL | Status: DC
  Administered 2019-08-27: 99 mg via ORAL
  Filled 2019-08-26: qty 1

## 2019-08-26 MED ORDER — CELECOXIB 200 MG PO CAPS: 200.0000 mg | ORAL_CAPSULE | Freq: Two times a day (BID) | ORAL | Status: DC

## 2019-08-26 MED ORDER — NEOMYCIN-POLYMYXIN B GU 40-200000 IR SOLN
Status: DC | PRN
  Administered 2019-08-26: 14 mL

## 2019-08-26 MED ORDER — ALPRAZOLAM 0.5 MG PO TABS
0.5000 mg | ORAL_TABLET | Freq: Every day | ORAL | Status: DC
  Administered 2019-08-26 – 2019-08-27 (×2): 0.5 mg via ORAL
  Filled 2019-08-26 (×2): qty 1

## 2019-08-26 SURGICAL SUPPLY — 73 items
ATTUNE MED DOME PAT 38 KNEE (Knees) ×2 IMPLANT
ATTUNE PS FEM RT SZ 5 CEM KNEE (Femur) ×2 IMPLANT
ATTUNE PSRP INSR SZ5 6 KNEE (Insert) ×2 IMPLANT
BASE TIBIAL ROT PLAT SZ 5 KNEE (Knees) ×1 IMPLANT
BATTERY INSTRU NAVIGATION (MISCELLANEOUS) ×8 IMPLANT
BLADE SAW 70X12.5 (BLADE) ×2 IMPLANT
BLADE SAW 90X13X1.19 OSCILLAT (BLADE) ×2 IMPLANT
BLADE SAW 90X25X1.19 OSCILLAT (BLADE) ×2 IMPLANT
BNDG CMPR 75X41 PLY HI ABS (GAUZE/BANDAGES/DRESSINGS) ×1
BNDG STRETCH 4X75 STRL LF (GAUZE/BANDAGES/DRESSINGS) ×2 IMPLANT
BSPLAT TIB 5 CMNT ROT PLAT STR (Knees) ×1 IMPLANT
BTRY SRG DRVR LF (MISCELLANEOUS) ×4
CANISTER SUCT 3000ML PPV (MISCELLANEOUS) ×2 IMPLANT
CEMENT HV SMART SET (Cement) ×4 IMPLANT
COOLER POLAR GLACIER W/PUMP (MISCELLANEOUS) ×2 IMPLANT
COVER WAND RF STERILE (DRAPES) ×2 IMPLANT
CUFF TOURN SGL QUICK 24 (TOURNIQUET CUFF) ×2
CUFF TRNQT CYL 24X4X16.5-23 (TOURNIQUET CUFF) ×1 IMPLANT
DRAPE 3/4 80X56 (DRAPES) ×2 IMPLANT
DRSG DERMACEA 8X12 NADH (GAUZE/BANDAGES/DRESSINGS) ×2 IMPLANT
DRSG OPSITE POSTOP 4X14 (GAUZE/BANDAGES/DRESSINGS) ×2 IMPLANT
DRSG TEGADERM 4X4.75 (GAUZE/BANDAGES/DRESSINGS) ×2 IMPLANT
DURAPREP 26ML APPLICATOR (WOUND CARE) ×4 IMPLANT
ELECT REM PT RETURN 9FT ADLT (ELECTROSURGICAL) ×2
ELECTRODE REM PT RTRN 9FT ADLT (ELECTROSURGICAL) ×1 IMPLANT
EX-PIN ORTHOLOCK NAV 4X150 (PIN) ×4 IMPLANT
GAUZE SPONGE 4X4 12PLY STRL (GAUZE/BANDAGES/DRESSINGS) ×4 IMPLANT
GAUZE XEROFORM 1X8 LF (GAUZE/BANDAGES/DRESSINGS) ×2 IMPLANT
GLOVE BIOGEL M STRL SZ7.5 (GLOVE) ×4 IMPLANT
GLOVE INDICATOR 8.0 STRL GRN (GLOVE) ×2 IMPLANT
GOWN STRL REUS W/ TWL LRG LVL3 (GOWN DISPOSABLE) ×2 IMPLANT
GOWN STRL REUS W/TWL LRG LVL3 (GOWN DISPOSABLE) ×4
HEMOVAC 400CC 10FR (MISCELLANEOUS) ×2 IMPLANT
HOLDER FOLEY CATH W/STRAP (MISCELLANEOUS) ×2 IMPLANT
HOOD PEEL AWAY FLYTE STAYCOOL (MISCELLANEOUS) ×4 IMPLANT
KIT TURNOVER KIT A (KITS) ×2 IMPLANT
KNIFE SCULPS 14X20 (INSTRUMENTS) ×2 IMPLANT
LABEL OR SOLS (LABEL) ×2 IMPLANT
MANIFOLD NEPTUNE II (INSTRUMENTS) ×2 IMPLANT
NDL SAFETY ECLIPSE 18X1.5 (NEEDLE) ×1 IMPLANT
NEEDLE HYPO 18GX1.5 SHARP (NEEDLE) ×2
NEEDLE SPNL 20GX3.5 QUINCKE YW (NEEDLE) ×4 IMPLANT
NS IRRIG 500ML POUR BTL (IV SOLUTION) ×2 IMPLANT
PACK TOTAL KNEE (MISCELLANEOUS) ×2 IMPLANT
PAD ABD DERMACEA PRESS 5X9 (GAUZE/BANDAGES/DRESSINGS) ×2 IMPLANT
PAD WRAPON POLAR KNEE (MISCELLANEOUS) ×1 IMPLANT
PENCIL SMOKE ULTRAEVAC 22 CON (MISCELLANEOUS) ×2 IMPLANT
PIN DRILL QUICK PACK ×2 IMPLANT
PIN FIXATION 1/8DIA X 3INL (PIN) ×6 IMPLANT
PULSAVAC PLUS IRRIG FAN TIP (DISPOSABLE) ×2
SOL .9 NS 3000ML IRR  AL (IV SOLUTION) ×1
SOL .9 NS 3000ML IRR AL (IV SOLUTION) ×1
SOL .9 NS 3000ML IRR UROMATIC (IV SOLUTION) ×1 IMPLANT
SOL PREP PVP 2OZ (MISCELLANEOUS) ×2
SOLUTION PREP PVP 2OZ (MISCELLANEOUS) ×1 IMPLANT
SPONGE DRAIN TRACH 4X4 STRL 2S (GAUZE/BANDAGES/DRESSINGS) ×2 IMPLANT
STAPLER SKIN PROX 35W (STAPLE) ×2 IMPLANT
STOCKINETTE BIAS CUT 6 980064 (GAUZE/BANDAGES/DRESSINGS) ×2 IMPLANT
STOCKINETTE IMPERV 14X48 (MISCELLANEOUS) IMPLANT
STRAP TIBIA SHORT (MISCELLANEOUS) ×2 IMPLANT
SUCTION FRAZIER HANDLE 10FR (MISCELLANEOUS) ×1
SUCTION TUBE FRAZIER 10FR DISP (MISCELLANEOUS) ×1 IMPLANT
SUT VIC AB 0 CT1 36 (SUTURE) ×4 IMPLANT
SUT VIC AB 1 CT1 36 (SUTURE) ×4 IMPLANT
SUT VIC AB 2-0 CT2 27 (SUTURE) ×2 IMPLANT
SYR 20ML LL LF (SYRINGE) ×2 IMPLANT
SYR 30ML LL (SYRINGE) ×4 IMPLANT
TIBIAL BASE ROT PLAT SZ 5 KNEE (Knees) ×2 IMPLANT
TIP FAN IRRIG PULSAVAC PLUS (DISPOSABLE) ×1 IMPLANT
TOWEL OR 17X26 4PK STRL BLUE (TOWEL DISPOSABLE) ×2 IMPLANT
TOWER CARTRIDGE SMART MIX (DISPOSABLE) ×2 IMPLANT
TRAY FOLEY MTR SLVR 16FR STAT (SET/KITS/TRAYS/PACK) ×2 IMPLANT
WRAPON POLAR PAD KNEE (MISCELLANEOUS) ×2

## 2019-08-26 NOTE — Anesthesia Preprocedure Evaluation (Signed)
Anesthesia Evaluation  Patient identified by MRN, date of birth, ID band Patient awake    Reviewed: Allergy & Precautions, NPO status , Patient's Chart, lab work & pertinent test results  History of Anesthesia Complications Negative for: history of anesthetic complications  Airway Mallampati: II  TM Distance: >3 FB Neck ROM: Full    Dental no notable dental hx.    Pulmonary asthma , neg sleep apnea, neg COPD,    breath sounds clear to auscultation- rhonchi (-) wheezing      Cardiovascular hypertension, Pt. on medications (-) CAD, (-) Past MI, (-) Cardiac Stents and (-) CABG  Rhythm:Regular Rate:Normal - Systolic murmurs and - Diastolic murmurs    Neuro/Psych neg Seizures negative neurological ROS  negative psych ROS   GI/Hepatic negative GI ROS, Neg liver ROS,   Endo/Other  neg diabetesHypothyroidism   Renal/GU negative Renal ROS     Musculoskeletal  (+) Arthritis ,   Abdominal (+) - obese,   Peds  Hematology  (+) anemia ,   Anesthesia Other Findings Past Medical History: No date: Allergy 09-13-12: Arthritis     Comment:  arthritis- knees, right shoulder, finger No date: Asthma     Comment:  h/o 04/16/15: Breast cancer ([place])     Comment:  right breastinvasive and in situ mammary  04/20/2015: Breast cancer of upper-outer quadrant of right female  breast ([place]) No date: Family history of adverse reaction to anesthesia     Comment:  brother-n/v No date: Hypertension 09-13-12: Hypothyroidism     Comment:  tx. supplement No date: Personal history of radiation therapy 07/01/15-08/04/15: Radiation     Comment:  right breast   Reproductive/Obstetrics                             Lab Results  Component Value Date   WBC 6.5 08/16/2019   HGB 11.6 (L) 08/16/2019   HCT 37.0 08/16/2019   MCV 97.6 08/16/2019   PLT 278 08/16/2019    Anesthesia Physical Anesthesia Plan  ASA: III  Anesthesia  Plan: Spinal   Post-op Pain Management:    Induction:   PONV Risk Score and Plan: 2 and Propofol infusion  Airway Management Planned: Natural Airway  Additional Equipment:   Intra-op Plan:   Post-operative Plan:   Informed Consent: I have reviewed the patients History and Physical, chart, labs and discussed the procedure including the risks, benefits and alternatives for the proposed anesthesia with the patient or authorized representative who has indicated his/her understanding and acceptance.     Dental advisory given  Plan Discussed with: CRNA and Anesthesiologist  Anesthesia Plan Comments:         Anesthesia Quick Evaluation

## 2019-08-26 NOTE — Anesthesia Post-op Follow-up Note (Signed)
Anesthesia QCDR form completed.        

## 2019-08-26 NOTE — Anesthesia Procedure Notes (Signed)
Spinal  Patient location during procedure: OR Start time: 08/26/2019 11:14 AM End time: 08/26/2019 11:18 AM Staffing Anesthesiologist: Emmie Niemann, MD Resident/CRNA: Bernardo Heater, CRNA Performed: resident/CRNA  Preanesthetic Checklist Completed: patient identified, site marked, surgical consent, pre-op evaluation, timeout performed, IV checked, risks and benefits discussed and monitors and equipment checked Spinal Block Patient position: sitting Prep: ChloraPrep Patient monitoring: heart rate, continuous pulse ox and blood pressure Approach: midline Location: L4-5 Injection technique: single-shot Needle Needle type: Introducer and Pencil-Tip  Needle gauge: 24 G Needle length: 9 cm Additional Notes Negative paresthesia. Negative blood return. Positive free-flowing CSF. Expiration date of kit checked and confirmed. Patient tolerated procedure well, without complications.

## 2019-08-26 NOTE — H&P (Signed)
The patient has been re-examined, and the chart reviewed, and there have been no interval changes to the documented history and physical.    The risks, benefits, and alternatives have been discussed at length. The patient expressed understanding of the risks benefits and agreed with plans for surgical intervention.  James P. Hooten, Jr. M.D.    

## 2019-08-26 NOTE — Transfer of Care (Signed)
Immediate Anesthesia Transfer of Care Note  Patient: Debra Kelly  Procedure(s) Performed: COMPUTER ASSISTED TOTAL KNEE ARTHROPLASTY (Right Knee)  Patient Location: PACU  Anesthesia Type:Spinal  Level of Consciousness: awake, alert  and oriented  Airway & Oxygen Therapy: Patient Spontanous Breathing and Patient connected to nasal cannula oxygen  Post-op Assessment: Report given to RN and Post -op Vital signs reviewed and stable  Post vital signs: Reviewed and stable  Last Vitals:  Vitals Value Taken Time  BP    Temp    Pulse 83 08/26/19 1456  Resp 14 08/26/19 1456  SpO2 99 % 08/26/19 1456  Vitals shown include unvalidated device data.  Last Pain:  Vitals:   08/26/19 1030  TempSrc: Oral  PainSc: 0-No pain         Complications: No apparent anesthesia complications

## 2019-08-26 NOTE — Op Note (Signed)
OPERATIVE NOTE  DATE OF SURGERY:  08/26/2019  PATIENT NAME:  KOPELYNN Kelly   DOB: Sep 06, 1941  MRN: [id number]  PRE-OPERATIVE DIAGNOSIS: Degenerative arthrosis of the right knee, primary  POST-OPERATIVE DIAGNOSIS:  Same  PROCEDURE:  Right total knee arthroplasty using computer-assisted navigation  SURGEON:  Marciano Sequin. M.D.  ASSISTANT: Cassell Smiles, PA-C (present and scrubbed throughout the case, critical for assistance with exposure, retraction, instrumentation, and closure)  ANESTHESIA: spinal  ESTIMATED BLOOD LOSS: 50 mL  FLUIDS REPLACED: 1100 mL of crystalloid  TOURNIQUET TIME: 92 minutes  DRAINS: 2 medium Hemovac drains  SOFT TISSUE RELEASES: Anterior cruciate ligament, posterior cruciate ligament, deep medial collateral ligament, patellofemoral ligament  IMPLANTS UTILIZED: DePuy Attune size 5 posterior stabilized femoral component (cemented), size 5 rotating platform tibial component (cemented), 38 mm medialized dome patella (cemented), and a 6 mm stabilized rotating platform polyethylene insert.  INDICATIONS FOR SURGERY: Debra Kelly is a 78 y.o. year old female with a long history of progressive knee pain. X-rays demonstrated severe degenerative changes in tricompartmental fashion. The patient had not seen any significant improvement despite conservative nonsurgical intervention. After discussion of the risks and benefits of surgical intervention, the patient expressed understanding of the risks benefits and agree with plans for total knee arthroplasty.   The risks, benefits, and alternatives were discussed at length including but not limited to the risks of infection, bleeding, nerve injury, stiffness, blood clots, the need for revision surgery, cardiopulmonary complications, among others, and they were willing to proceed.  PROCEDURE IN DETAIL: The patient was brought into the operating room and, after adequate spinal anesthesia was achieved, a tourniquet was placed on  the patient's upper thigh. The patient's knee and leg were cleaned and prepped with alcohol and DuraPrep and draped in the usual sterile fashion. A "timeout" was performed as per usual protocol. The lower extremity was exsanguinated using an Esmarch, and the tourniquet was inflated to 300 mmHg. An anterior longitudinal incision was made followed by a standard mid vastus approach. The deep fibers of the medial collateral ligament were elevated in a subperiosteal fashion off of the medial flare of the tibia so as to maintain a continuous soft tissue sleeve. The patella was subluxed laterally and the patellofemoral ligament was incised. Inspection of the knee demonstrated severe degenerative changes with full-thickness loss of articular cartilage. Osteophytes were debrided using a rongeur. Anterior and posterior cruciate ligaments were excised. Two 4.0 mm Schanz pins were inserted in the femur and into the tibia for attachment of the array of trackers used for computer-assisted navigation. Hip center was identified using a circumduction technique. Distal landmarks were mapped using the computer. The distal femur and proximal tibia were mapped using the computer. The distal femoral cutting guide was positioned using computer-assisted navigation so as to achieve a 5 distal valgus cut. The femur was sized and it was felt that a size 5 femoral component was appropriate. A size 5 femoral cutting guide was positioned and the anterior cut was performed and verified using the computer. This was followed by completion of the posterior and chamfer cuts. Femoral cutting guide for the central box was then positioned in the center box cut was performed.  Attention was then directed to the proximal tibia. Medial and lateral menisci were excised. The extramedullary tibial cutting guide was positioned using computer-assisted navigation so as to achieve a 0 varus-valgus alignment and 3 posterior slope. The cut was performed and  verified using the computer. The proximal tibia was  sized and it was felt that a size 5 tibial tray was appropriate. Tibial and femoral trials were inserted followed by insertion of a 6 mm polyethylene insert. This allowed for excellent mediolateral soft tissue balancing both in flexion and in full extension. Finally, the patella was cut and prepared so as to accommodate a 38 mm medialized dome patella. A patella trial was placed and the knee was placed through a range of motion with excellent patellar tracking appreciated. The femoral trial was removed after debridement of posterior osteophytes. The central post-hole for the tibial component was reamed followed by insertion of a keel punch. Tibial trials were then removed. Cut surfaces of bone were irrigated with copious amounts of normal saline with antibiotic solution using pulsatile lavage and then suctioned dry. Polymethylmethacrylate cement was prepared in the usual fashion using a vacuum mixer. Cement was applied to the cut surface of the proximal tibia as well as along the undersurface of a size 5 rotating platform tibial component. Tibial component was positioned and impacted into place. Excess cement was removed using [employer] fast streamer. Cement was then applied to the cut surfaces of the femur as well as along the posterior flanges of the size 5 femoral component. The femoral component was positioned and impacted into place. Excess cement was removed using [employer] fast streamer. A 6 mm polyethylene trial was inserted and the knee was brought into full extension with steady axial compression applied. Finally, cement was applied to the backside of a 38 mm medialized dome patella and the patellar component was positioned and patellar clamp applied. Excess cement was removed using [employer] fast streamer. After adequate curing of the cement, the tourniquet was deflated after a total tourniquet time of 92 minutes. Hemostasis was achieved using electrocautery. The knee was  irrigated with copious amounts of normal saline with antibiotic solution using pulsatile lavage and then suctioned dry. 20 mL of 1.3% Exparel and 60 mL of 0.25% Marcaine in 40 mL of normal saline was injected along the posterior capsule, medial and lateral gutters, and along the arthrotomy site. A 6 mm stabilized rotating platform polyethylene insert was inserted and the knee was placed through a range of motion with excellent mediolateral soft tissue balancing appreciated and excellent patellar tracking noted. 2 medium drains were placed in the wound bed and brought out through separate stab incisions. The medial parapatellar portion of the incision was reapproximated using interrupted sutures of #1 Vicryl. Subcutaneous tissue was approximated in layers using first #0 Vicryl followed #2-0 Vicryl. The skin was approximated with skin staples. A sterile dressing was applied.  The patient tolerated the procedure well and was transported to the recovery room in stable condition.    James P. Holley Bouche., M.D.

## 2019-08-27 ENCOUNTER — Other Ambulatory Visit: Payer: Self-pay

## 2019-08-27 MED ORDER — OXYCODONE HCL 5 MG PO TABS: 5.0000 mg | ORAL_TABLET | ORAL | 0 refills | Status: DC | PRN

## 2019-08-27 MED ORDER — ENOXAPARIN SODIUM 40 MG/0.4ML ~~LOC~~ SOLN: 40.0000 mg | SUBCUTANEOUS | 0 refills | Status: DC

## 2019-08-27 MED ORDER — TRAMADOL HCL 50 MG PO TABS: 50.0000 mg | ORAL_TABLET | Freq: Four times a day (QID) | ORAL | 0 refills | Status: AC | PRN

## 2019-08-27 NOTE — Evaluation (Signed)
Occupational Therapy Evaluation Patient Details Name: Debra Kelly MRN: [id number] DOB: Jul 23, 1941 Today's Date: 08/27/2019    History of Present Illness Pt is a 78 y/o female, s/p R TKA (08/26/2019) for degenerative arthrosis of the right knee. Pt with PMH including arthritis, asthma, breast cancer (right), HTN, hypothyroidism.   Clinical Impression   Pt is 78 year old female s/p R TKA.  Pt was independent in all ADLs prior to surgery living alone and is eager to return to PLOF.  She has a boyfriend who can help but has his own home as well.  Pt currently requires min assist for LB dressing while in seated position due to pain and limited AROM of R knee.  Pt would benefit from instruction in dressing techniques with or without assistive devices for dressing and bathing skills.  Pt would also benefit from recommendations for home modifications to increase safety in the bathroom and prevent falls. Will assess for OT [facility] needs as pt progresses in therapy but most likely will not need any further OT.      Follow Up Recommendations  No OT follow up    Equipment Recommendations  Other (comment)(reacher, sock aid)    Recommendations for Other Services       Precautions / Restrictions Precautions Precautions: Fall Restrictions Weight Bearing Restrictions: Yes RLE Weight Bearing: Weight bearing as tolerated      Mobility Bed Mobility Overal bed mobility: Needs Assistance Bed Mobility: Supine to Sit     Supine to sit: Supervision        Transfers Overall transfer level: Needs assistance Equipment used: Rolling walker (2 wheeled) Transfers: Sit to/from [facility] Sit to Stand: Min guard Stand pivot transfers: Min guard            Balance Overall balance assessment: Needs assistance Sitting-balance support: Feet supported Sitting balance-Leahy Scale: Fair     Standing balance support: Bilateral upper extremity supported Standing balance-Leahy Scale:  Fair                             ADL either performed or assessed with clinical judgement   ADL Overall ADL's : Needs assistance/impaired Eating/Feeding: Independent;Set up   Grooming: Wash/dry hands;Wash/dry face;Oral care;Brushing hair;Applying deodorant;Set up;Independent   Upper Body Bathing: Independent;Set up   Lower Body Bathing: Minimal assistance;Set up   Upper Body Dressing : Independent;Set up   Lower Body Dressing: Minimal assistance;Set up;With adaptive equipment;Sit to/from stand                 General ADL Comments: Pt is ambulating with a FWW with supervision to min guard assist to and from toilet and has a raised toilet seat with bars at home. Pt is progressing well and pain is 1/10.  She has a boyfriend to help her as needed when she goes home but they both have their own homes.     Vision Baseline Vision/History: No visual deficits Patient Visual Report: No change from baseline       Perception     Praxis      Pertinent Vitals/Pain Pain Assessment: 0-10 Pain Score: 1  Pain Location: R knee and thigh Pain Descriptors / Indicators: Aching;Discomfort Pain Intervention(s): Limited activity within patient's tolerance;Monitored during session;Premedicated before session;Repositioned;Ice applied     Hand Dominance Left   Extremity/Trunk Assessment Upper Extremity Assessment Upper Extremity Assessment: Overall WFL for tasks assessed   Lower Extremity Assessment Lower Extremity Assessment: Defer to  PT evaluation RLE Deficits / Details: R knee ROM: +10 to 65 deg       Communication Communication Communication: No difficulties   Cognition Arousal/Alertness: Awake/alert Behavior During Therapy: WFL for tasks assessed/performed Overall Cognitive Status: Within Functional Limits for tasks assessed                                     General Comments       Exercises Total Joint Exercises Ankle Circles/Pumps:  AROM;Both;20 reps Quad Sets: AROM;Right;10 reps Goniometric ROM: +10 to 65 deg Marching in Standing: AROM;Both;10 reps   Shoulder Instructions      Home Living Family/patient expects to be discharged to:: Private residence Living Arrangements: Alone Available Help at Discharge: Other (Comment) Type of Home: House Home Access: Stairs to enter [employer] of Steps: 3 stairs at entry, no handrails Entrance Stairs-Rails: None Home Layout: One level     Bathroom Shower/Tub: Teacher, early years/pre: Handicapped height     Home Equipment: Civil engineer, contracting;Toilet riser;Grab bars - tub/shower;Grab bars - toilet          Prior Functioning/Environment Level of Independence: Independent                 OT Problem List: Decreased strength;Decreased range of motion;Decreased activity tolerance;Impaired balance (sitting and/or standing);Pain;Decreased knowledge of use of DME or AE      OT Treatment/Interventions: Self-care/ADL training;Balance training;DME and/or AE instruction;Patient/family education    OT Goals(Current goals can be found in the care plan section) Acute Rehab OT Goals Patient Stated Goal: to go home OT Goal Formulation: With patient Time For Goal Achievement: 08/27/19 Potential to Achieve Goals: Good ADL Goals Pt Will Perform Lower Body Dressing: with set-up;with supervision;with adaptive equipment;sit to/from stand Pt Will Transfer to Toilet: with set-up;with supervision;regular height toilet;ambulating  OT Frequency: Min 1X/week   Barriers to D/C:            Co-evaluation              AM-PAC OT "6 Clicks" Daily Activity     Outcome Measure Help from another person eating meals?: None Help from another person taking care of personal grooming?: None Help from another person toileting, which includes using toliet, bedpan, or urinal?: A Little Help from another person bathing (including washing, rinsing, drying)?: A Little Help  from another person to put on and taking off regular upper body clothing?: None Help from another person to put on and taking off regular lower body clothing?: A Little 6 Click Score: 21   End of Session Equipment Utilized During Treatment: Gait belt  Activity Tolerance: Patient tolerated treatment well Patient left: in chair;with call bell/phone within reach;with chair alarm set  OT Visit Diagnosis: Other abnormalities of gait and mobility (R26.89);Pain;History of falling (Z91.81) Pain - Right/Left: Right Pain - part of body: Knee                Time: 0940-1010 OT Time Calculation (min): 30 min Charges:  OT General Charges $OT Visit: 1 Visit OT Evaluation $OT Eval Low Complexity: 1 Low OT Treatments $Self Care/Home Management : 8-22 mins  Chrys Racer, OTR/L, [place] ascom [phone] 08/27/19, 10:45 AM

## 2019-08-27 NOTE — Progress Notes (Signed)
   Subjective: 1 Day Post-Op Procedure(s) (LRB): COMPUTER ASSISTED TOTAL KNEE ARTHROPLASTY (Right) Patient reports pain as 0 on 0-10 scale.   Patient is well, and has had no acute complaints or problems We will start therapy today.  Dangle leg at the side of the bed with nurses assistant during the night Plan is to go Home after hospital stay. no nausea and no vomiting Patient denies any chest pains or shortness of breath.  Resting very well and voicing no complaints Has already started milk of magnesia  Objective: Vital signs in last 24 hours: Temp:  [97.7 F (36.5 C)-98.6 F (37 C)] 97.7 F (36.5 C) (09/15 0419) Pulse Rate:  [67-105] 67 (09/15 0419) Resp:  [10-19] 18 (09/15 0419) BP: (109-158)/(65-89) 129/70 (09/15 0419) SpO2:  [98 %-100 %] 100 % (09/15 0419) Weight:  [71 kg] 71 kg (09/14 1030) Heels are non tender and elevated off the bed using rolled towels along with bone foam under operative heel  Intake/Output from previous day: 09/14 0701 - 09/15 0700 In: 2632.2 [P.O.:120; I.V.:2102.2; IV Piggyback:410] Out: 1520 [Urine:1350; Drains:120; Blood:50] Intake/Output this shift: No intake/output data recorded.  No results for input(s): HGB in the last 72 hours. No results for input(s): WBC, RBC, HCT, PLT in the last 72 hours. No results for input(s): NA, K, CL, CO2, BUN, CREATININE, GLUCOSE, CALCIUM in the last 72 hours. No results for input(s): LABPT, INR in the last 72 hours.  EXAM General - Patient is Alert, Appropriate and Oriented Extremity - Neurologically intact Neurovascular intact Sensation intact distally Intact pulses distally Dorsiflexion/Plantar flexion intact Compartment soft Dressing - dressing C/D/I Motor Function - intact, moving foot and toes well on exam.  Able to do straight leg raise on her own  Past Medical History:  Diagnosis Date  . Allergy   . Arthritis 09-13-12   arthritis- knees, right shoulder, finger  . Asthma    h/o  . Breast  cancer ([place]) 04/16/15   right breastinvasive and in situ mammary   . 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 Family history of adverse reaction to anesthesia    brother-n/v  . Hypertension   . Hypothyroidism 09-13-12   tx. supplement  . Personal history of radiation therapy   . Radiation 07/01/15-08/04/15   right breast    Assessment/Plan: 1 Day Post-Op Procedure(s) (LRB): COMPUTER ASSISTED TOTAL KNEE ARTHROPLASTY (Right) Active Problems:   Total knee replacement status  Estimated body mass index is 26.87 kg/m as calculated from the following:   Height as of this encounter: 5\' 4"  (1.626 m).   Weight as of this encounter: 71 kg. Advance diet Up with therapy D/C IV fluids Plan for discharge tomorrow Discharge home with home health  Labs: None DVT Prophylaxis - Lovenox, Foot Pumps and TED hose Weight-Bearing as tolerated to right leg D/C O2 and Pulse OX and try on Room Air  Jon R. Cushing Boneau 08/27/2019, 8:01 AM

## 2019-08-27 NOTE — Anesthesia Postprocedure Evaluation (Signed)
Anesthesia Post Note  Patient: Debra Kelly  Procedure(s) Performed: COMPUTER ASSISTED TOTAL KNEE ARTHROPLASTY (Right Knee)  Patient location during evaluation: Nursing Unit Anesthesia Type: Spinal Level of consciousness: oriented and awake and alert Pain management: pain level controlled Vital Signs Assessment: post-procedure vital signs reviewed and stable Respiratory status: spontaneous breathing and respiratory function stable Cardiovascular status: stable Postop Assessment: no headache, no backache and no apparent nausea or vomiting Anesthetic complications: no     Last Vitals:  Vitals:   08/27/19 0419 08/27/19 0816  BP: 129/70 (!) 121/58  Pulse: 67 73  Resp: 18 18  Temp: 36.5 C 36.9 C  SpO2: 100% 100%    Last Pain:  Vitals:   08/27/19 0816  TempSrc: Oral  PainSc:                  Jerrye Noble

## 2019-08-27 NOTE — Progress Notes (Signed)
Physical Therapy Treatment Patient Details Name: Debra Kelly MRN: [id number] DOB: Aug 07, 1941 Today's Date: 08/27/2019    History of Present Illness Pt is a 78 y/o female, s/p R TKA (08/26/2019) for degenerative arthrosis of the right knee. Pt with PMH including arthritis, asthma, breast cancer (right), HTN, hypothyroidism.    PT Comments    Pt tolerates session well though with some increased soreness with mobility. Pt able to perform bed mobility and sit to stand transfer with supervision/CGA for safety. Pt requires CGA with RW with ambulation for safety and cuing for RW management. Pt ambulates 57' with first attempt and then 37' with  second attempt - first trial distance is limited due to patient's reports of feeling she is going to have a BM while standing. Donned adult brief and repeated gait trial. Pt will continue to benefit from skilled PT intervention for further education regarding safe functional mobility, RW management and HEP, and to address PT impairments including strength, range of motion, balance.  Follow Up Recommendations  Home health PT     Equipment Recommendations  Rolling walker with 5" wheels    Recommendations for Other Services       Precautions / Restrictions Precautions Precautions: Fall Restrictions Weight Bearing Restrictions: Yes RLE Weight Bearing: Weight bearing as tolerated    Mobility  Bed Mobility Overal bed mobility: Needs Assistance Bed Mobility: Supine to Sit     Supine to sit: Supervision        Transfers Overall transfer level: Needs assistance Equipment used: Rolling walker (2 wheeled) Transfers: Sit to/from Stand Sit to Stand: Min guard            Ambulation/Gait Ambulation/Gait assistance: Min guard Gait Distance (Feet): 41' first attempt, 100' second attempt. (During first trial, pt reports she feels she is going to have a BM while walking, resulting in early discontinuation of gait attempt for bathroom break. Able  to perform second trial afterward.) Assistive device: Rolling walker (2 wheeled) Gait Pattern/deviations: Antalgic     General Gait Details: Able to progress from step-to pattern to step-through pattern with cuing.   Stairs             Wheelchair Mobility    Modified Rankin (Stroke Patients Only)       Balance Overall balance assessment: Needs assistance Sitting-balance support: Feet supported Sitting balance-Leahy Scale: Good     Standing balance support: Bilateral upper extremity supported Standing balance-Leahy Scale: Fair                              Cognition Arousal/Alertness: Awake/alert Behavior During Therapy: WFL for tasks assessed/performed Overall Cognitive Status: Within Functional Limits for tasks assessed                                        Exercises Total Joint Exercises Ankle Circles/Pumps: AROM;Both;20 reps Quad Sets: Strengthening;Right;10 reps Hip ABduction/ADduction: AROM;Right;10 reps    General Comments        Pertinent Vitals/Pain Pain Assessment: 0-10 Pain Score: 2 (increasing to 4/10 with ambulation) Pain Location: R knee and anterior thigh Pain Descriptors / Indicators: Aching Pain Intervention(s): Limited activity within patient's tolerance;Monitored during session;Ice applied    Home Living                      Prior Function  PT Goals (current goals can now be found in the care plan section) Acute Rehab PT Goals Patient Stated Goal: to go home PT Goal Formulation: With patient Time For Goal Achievement: 09/10/19 Potential to Achieve Goals: Good Progress towards PT goals: Progressing toward goals    Frequency    BID      PT Plan Current plan remains appropriate    Co-evaluation              AM-PAC PT "6 Clicks" Mobility   Outcome Measure  Help needed turning from your back to your side while in a flat bed without using bedrails?: A Little Help  needed moving from lying on your back to sitting on the side of a flat bed without using bedrails?: A Little Help needed moving to and from a bed to a chair (including a wheelchair)?: A Little Help needed standing up from a chair using your arms (e.g., wheelchair or bedside chair)?: A Little Help needed to walk in hospital room?: A Little Help needed climbing 3-5 steps with a railing? : A Lot 6 Click Score: 17    End of Session Equipment Utilized During Treatment: Gait belt Activity Tolerance: Patient tolerated treatment well Patient left: in chair;with chair alarm set;with call bell/phone within reach;with SCD's reapplied Nurse Communication: Mobility status;Other (comment)(RN notified of erythema at site of IV removal and of pt having diaper donned from ambulating) PT Visit Diagnosis: Unsteadiness on feet (R26.81);Muscle weakness (generalized) (M62.81);Difficulty in walking, not elsewhere classified (R26.2);Pain Pain - Right/Left: Right Pain - part of body: Knee     Time: [id number] PT Time Calculation (min) (ACUTE ONLY): 40 min  Charges:  $Gait Training: 23-37 mins $Therapeutic Exercise: 8-22 mins                     Petra Kuba, PT, DPT 08/27/19, 4:22 PM

## 2019-08-27 NOTE — TOC Progression Note (Signed)
Transition of Care [facility]) - Progression Note    Patient Details  Name: Debra Kelly MRN: [id number] Date of Birth: June 16, 1941  [facility] CM/SW Contact  Su Hilt, RN Phone Number: 08/27/2019, 4:14 PM  Clinical Narrative:     [name] at [facility] has accepted the patient for PT [facility]   Expected Discharge Plan: [place] Barriers to Discharge: Continued Medical Work up  Expected Discharge Plan and Services Expected Discharge Plan: [place]   Discharge Planning Services: CM Consult Post Acute Care Choice: Home Health                   DME Arranged: Walker rolling DME Agency: [employer] Date DME Agency Contacted: 08/27/19 Time DME Agency Contacted: [phone] Representative spoke with at DME Agency: [place]: PT [place]: [facility] [employer]) Date [place]: 08/27/19 Time [place]: 1405 Representative spoke with at [place]: [place] ([place]) Interventions    Readmission Risk Interventions No flowsheet data found.

## 2019-08-27 NOTE — Progress Notes (Signed)
This nurse f/u with [facility] RN regarding POC for patient and IV vs. changing to PO meds today. Awaiting further orders. Jasmine RN vu. Fran Lowes, RN VAST

## 2019-08-27 NOTE — TOC Initial Note (Signed)
Transition of Care [facility]) - Initial/Assessment Note    Patient Details  Name: Debra Kelly MRN: [id number] Date of Birth: May 28, 1941  Transition [facility] Care [facility]) CM/SW Contact:    Su Hilt, RN Phone Number: 08/27/2019, 2:07 PM  Clinical Narrative:                 Met with the patient in the room to discuss [place] and needs She lives at home alone but her boyfriend and his daughter will be available to help The patient has a bathroom that is very close to her room and she does not feel she needs a BSC but she would n\benefit from a RW, I notified Brad with [employer] of the need I notified [facility] that the [facility] would [facility] use [facility] for [facility] and Helene Kelp will check to see if they can accept the [employer] and let me know The patient's boyfriend provides transportation  The patient has been given the price of Lovenox and is able to afford it. She stated that the pharmacy called and it is already ready to Pick up No further needs at this time  Expected Discharge Plan: [place] Barriers to Discharge: Continued Medical Work up   Patient Goals and CMS Choice Patient states their goals for this hospitalization and ongoing recovery are:: go home CMS Medicare.gov Compare Post Acute Care list provided to:: Patient Choice offered to / list presented to : Patient  Expected Discharge Plan and Services Expected Discharge Plan: [place]   Discharge Planning Services: CM Consult Post Acute Care Choice: Home Health                   DME Arranged: Walker rolling DME Agency: [employer] Date DME Agency Contacted: 08/27/19 Time DME Agency Contacted: [phone] Representative spoke with at DME Agency: [place]: PT [place]: [facility] ([facility] [employer]) Date [place]: 08/27/19 Time [place]: 26 Representative spoke with at [place] Arrangements/Services   Lives  with:: Self(boyfrined and his daughter will be stayuing with her to hep her) Patient language and need for interpreter reviewed:: Yes Do you feel safe going back to the place where you live?: Yes      Need for Family Participation in Patient Care: No (Comment) Care giver support system in place?: Yes (comment)   Criminal Activity/Legal Involvement Pertinent to Current Situation/Hospitalization: No - Comment as needed  Activities of Daily Living Home Assistive Devices/Equipment: None ADL Screening (condition at time of admission) Patient's cognitive ability adequate to safely complete daily activities?: Yes Is the patient deaf or have difficulty hearing?: No Does the patient have difficulty seeing, even when wearing glasses/contacts?: No Does the patient have difficulty concentrating, remembering, or making decisions?: No Patient able to express need for assistance with ADLs?: Yes Does the patient have difficulty dressing or bathing?: No Independently performs ADLs?: Yes (appropriate for developmental age) Does the patient have difficulty walking or climbing stairs?: No Weakness of Legs: None Weakness of Arms/Hands: None  Permission Sought/Granted   Permission granted to share information with : Yes, Verbal Permission Granted              Emotional Assessment Appearance:: Appears stated age Attitude/Demeanor/Rapport: Engaged Affect (typically observed): Appropriate Orientation: : Oriented to Self, Oriented to Place, Oriented to  Time, Oriented to Situation Alcohol / Substance Use: Not Applicable Psych Involvement: No (comment)  Admission  diagnosis:  PRIMARY OSTEOARTHRITIS OF RIGHT KNEE Patient Active Problem List   Diagnosis Date Noted  . Total knee replacement status 08/26/2019  . Low serum vitamin D 12/27/2018  . Lumbar radiculopathy  (LEFT L5, S1) 03/15/2018  . Post laminectomy syndrome 03/15/2018  . Lumbar degenerative disc disease 03/15/2018  . Lumbar foraminal  stenosis 03/15/2018  . Chronic pain syndrome 03/15/2018  . Hyperlipidemia, mixed 12/26/2017  . Primary osteoarthritis of right knee 07/25/2017  . Pernicious anemia 02/23/2016  . Lobular carcinoma of right breast ([place]) 12/15/2015  . Dermatitis 11/17/2015  . Acquired hypothyroidism 11/11/2015  . Essential hypertension 11/11/2015  . Breast cancer of upper-outer quadrant of right female breast ([place]) 04/20/2015   PCP:  Rusty Aus, MD Pharmacy:   [id number] [place] [id number]: [phone]: [phone]    Social Determinants of Health (SDOH) Interventions    Readmission Risk Interventions No flowsheet data found.

## 2019-08-27 NOTE — TOC Progression Note (Signed)
Transition of Care [facility]) - Progression Note    Patient Details  Name: Debra Kelly MRN: [id number] Date of Birth: 05-May-1941  Transition of Care [facility]) CM/SW [place], RN Phone Number: 08/27/2019, 9:40 AM  Clinical Narrative:    Requested the price  Of Lovenox, will notify the patient once obtained        Expected Discharge Plan and Services                                                 Social Determinants of Health (SDOH) Interventions    Readmission Risk Interventions No flowsheet data found.

## 2019-08-27 NOTE — Progress Notes (Signed)
Spoke with [facility] RN about PIV. Will message MD about possibly leaving the IV out.

## 2019-08-27 NOTE — Discharge Summary (Signed)
Physician Discharge Summary  Patient ID: ARYIAN VAILES MRN: [id number] DOB/AGE: Sep 08, 1941 78 y.o.  Admit date: 08/26/2019 Discharge date: 08/28/2019  Admission Diagnoses:  PRIMARY OSTEOARTHRITIS OF RIGHT KNEE   Discharge Diagnoses: Patient Active Problem List   Diagnosis Date Noted  . Total knee replacement status 08/26/2019  . Low serum vitamin D 12/27/2018  . Lumbar radiculopathy  (LEFT L5, S1) 03/15/2018  . Post laminectomy syndrome 03/15/2018  . Lumbar degenerative disc disease 03/15/2018  . Lumbar foraminal stenosis 03/15/2018  . Chronic pain syndrome 03/15/2018  . Hyperlipidemia, mixed 12/26/2017  . Primary osteoarthritis of right knee 07/25/2017  . Pernicious anemia 02/23/2016  . Lobular carcinoma of right breast ([place]) 12/15/2015  . Dermatitis 11/17/2015  . Acquired hypothyroidism 11/11/2015  . Essential hypertension 11/11/2015  . Breast cancer of upper-outer quadrant of right female breast ([place]) 04/20/2015    Past Medical History:  Diagnosis Date  . Allergy   . Arthritis 09-13-12   arthritis- knees, right shoulder, finger  . Asthma    h/o  . Breast cancer ([place]) 04/16/15   right breastinvasive and in situ mammary   . Breast cancer of upper-outer quadrant of right female breast ([place]) 04/20/2015  . Family history of adverse reaction to anesthesia    brother-n/v  . Hypertension   . Hypothyroidism 09-13-12   tx. supplement  . Personal history of radiation therapy   . Radiation 07/01/15-08/04/15   right breast     Transfusion: No transfusions during this admission   Consultants (if any): None  Discharged Condition: Improved  Hospital Course: LYNNSEY LASLIE is an 78 y.o. female who was admitted 08/26/2019 with a diagnosis of degenerative arthrosis right knee and went to the operating room on 08/26/2019 and underwent the above named procedures.    Surgeries:Procedure(s): COMPUTER ASSISTED TOTAL KNEE ARTHROPLASTY on 08/26/2019  PRE-OPERATIVE DIAGNOSIS: Degenerative  arthrosis of the right knee, primary  POST-OPERATIVE DIAGNOSIS:  Same  PROCEDURE:  Right total knee arthroplasty using computer-assisted navigation  SURGEON:  Marciano Sequin. M.D.  ASSISTANT: Cassell Smiles, PA-C (present and scrubbed throughout the case, critical for assistance with exposure, retraction, instrumentation, and closure)  ANESTHESIA: spinal  ESTIMATED BLOOD LOSS: 50 mL  FLUIDS REPLACED: 1100 mL of crystalloid  TOURNIQUET TIME: 92 minutes  DRAINS: 2 medium Hemovac drains  SOFT TISSUE RELEASES: Anterior cruciate ligament, posterior cruciate ligament, deep medial collateral ligament, patellofemoral ligament  IMPLANTS UTILIZED: DePuy Attune size 5 posterior stabilized femoral component (cemented), size 5 rotating platform tibial component (cemented), 38 mm medialized dome patella (cemented), and a 6 mm stabilized rotating platform polyethylene insert.  INDICATIONS FOR SURGERY: ANZAL COFFEY is a 78 y.o. year old female with a long history of progressive knee pain. X-rays demonstrated severe degenerative changes in tricompartmental fashion. The patient had not seen any significant improvement despite conservative nonsurgical intervention. After discussion of the risks and benefits of surgical intervention, the patient expressed understanding of the risks benefits and agree with plans for total knee arthroplasty.   The risks, benefits, and alternatives were discussed at length including but not limited to the risks of infection, bleeding, nerve injury, stiffness, blood clots, the need for revision surgery, cardiopulmonary complications, among others, and they were willing to proceed Patient tolerated the surgery well. No complications .Patient was taken to PACU where she was stabilized and then transferred to the orthopedic floor.  Patient started on Lovenox 30 mg q 12 hrs. Foot pumps applied bilaterally at 80 mm hgb. Heels elevated off bed with  rolled towels. No  evidence of DVT. Calves non tender. Negative [place]. Physical therapy started on day #1 for gait training and transfer with OT starting on  day #1 for ADL and assisted devices. Patient has done well with therapy. Ambulated 200 feet upon being discharged.  Was able to ascend and descend 4 steps safely and independently  Patient's IV And Foley were discontinued on day #1 with Hemovac being discontinued on day #2. Dressing was changed on day 2 prior to patient being discharged   She was given perioperative antibiotics:  Anti-infectives (From admission, onward)   Start     Dose/Rate Route Frequency Ordered Stop   08/26/19 1730  clindamycin (CLEOCIN) IVPB 600 mg     600 mg 100 mL/hr over 30 Minutes Intravenous Every 6 hours 08/26/19 1625 08/27/19 1729   08/26/19 1018  clindamycin (CLEOCIN) 900 MG/50ML IVPB    Note to Pharmacy: Myles Lipps   : cabinet override      08/26/19 1018 08/26/19 1128   08/26/19 0600  clindamycin (CLEOCIN) IVPB 900 mg     900 mg 100 mL/hr over 30 Minutes Intravenous On call to O.R. 08/25/19 2019 08/26/19 1140    .  She was fitted with AV 1 compression foot pump devices, instructed on heel pumps, early ambulation, and fitted with TED stockings bilaterally for DVT prophylaxis.  She benefited maximally from the hospital stay and there were no complications.    Recent vital signs:  Vitals:   08/26/19 2339 08/27/19 0419  BP: 119/65 129/70  Pulse: 69 67  Resp: 18 18  Temp: 98.1 F (36.7 C) 97.7 F (36.5 C)  SpO2: 100% 100%    Recent laboratory studies:  Lab Results  Component Value Date   HGB 11.6 (L) 08/16/2019   HGB 12.6 09/14/2017   HGB 13.2 04/22/2015   Lab Results  Component Value Date   WBC 6.5 08/16/2019   PLT 278 08/16/2019   Lab Results  Component Value Date   INR 1.0 08/16/2019   Lab Results  Component Value Date   NA 139 08/16/2019   K 3.4 (L) 08/16/2019   CL 101 08/16/2019   CO2 28 08/16/2019   BUN 34 (H) 08/16/2019   CREATININE  0.91 08/16/2019   GLUCOSE 107 (H) 08/16/2019    Discharge Medications:   Allergies as of 08/27/2019      Reactions   Shellfish Allergy Shortness Of Breath   Lisinopril Cough   Radiaplexrx [skin Protectants, Misc.] Other (See Comments)   Contraindicated with allergy of barbiturates causing rash   Adhesive [tape] Rash   Paper tape ok   Amoxicillin-pot Clavulanate Diarrhea   Barbiturates Rash   Celecoxib Other (See Comments), Hives   "made me feel funny" "made me feel funny"   Codeine Rash   Loratadine Other (See Comments)   Feels weird Other reaction(s): Other (See Comments)   Tapentadol Rash      Medication List    STOP taking these medications   aspirin EC 81 MG tablet     TAKE these medications   acetaminophen 500 MG tablet Commonly known as: TYLENOL Take 1,000 mg by mouth every 6 (six) hours as needed for moderate pain or headache.   ALPRAZolam 0.5 MG tablet Commonly known as: XANAX Take 0.5 tablets (0.25 mg total) by mouth 2 (two) times daily as needed for anxiety. What changed:   how much to take  when to take this   anastrozole 1 MG tablet Commonly known as: ARIMIDEX  Take 1 tablet (1 mg total) by mouth daily. What changed: when to take this   carboxymethylcellulose 0.5 % Soln Commonly known as: REFRESH PLUS Place 1 drop into both eyes 3 (three) times daily as needed (dry eyes).   diphenhydrAMINE HCl (Sleep) 50 MG tablet Take 50 mg by mouth at bedtime as needed for sleep.   enoxaparin 40 MG/0.4ML injection Commonly known as: LOVENOX Inject 0.4 mLs (40 mg total) into the skin daily for 14 days. Start taking on: August 29, 2019   hydrochlorothiazide 25 MG tablet Commonly known as: HYDRODIURIL Take 25 mg by mouth every other day.   levothyroxine 175 MCG tablet Commonly known as: SYNTHROID Take 175 mcg by mouth daily before breakfast.   loperamide 2 MG capsule Commonly known as: IMODIUM Take 4 mg by mouth daily as needed for diarrhea or loose  stools.   losartan 25 MG tablet Commonly known as: COZAAR Take 50 mg by mouth every morning.   Magnesium 500 MG Caps Take 500 mg by mouth every morning.   meloxicam 15 MG tablet Commonly known as: MOBIC Take 15 mg by mouth daily.   MULTIVITAMIN ADULT PO Take 1 tablet by mouth daily.   oxybutynin 5 MG tablet Commonly known as: DITROPAN Take 5 mg by mouth every morning.   oxyCODONE 5 MG immediate release tablet Commonly known as: Oxy IR/ROXICODONE Take 1 tablet (5 mg total) by mouth every 4 (four) hours as needed for moderate pain (pain score 4-6).   Potassium 99 MG Tabs Take 1 tablet (99 mg total) by mouth 2 (two) times daily. What changed: when to take this   traMADol 50 MG tablet Commonly known as: ULTRAM Take 1-2 tablets (50-100 mg total) by mouth every 6 (six) hours as needed for moderate pain. What changed:   how much to take  when to take this  reasons to take this  additional instructions   vitamin B-12 500 MCG tablet Commonly known as: CYANOCOBALAMIN Take 500 mcg by mouth daily.   vitamin C 500 MG tablet Commonly known as: ASCORBIC ACID Take 500 mg by mouth at bedtime as needed. Mostly takes in the wintertime   VITAMIN D PO Take 1,000 mcg by mouth daily.            Durable Medical Equipment  (From admission, onward)         Start     Ordered   08/26/19 1626  DME Walker rolling  Once    Question:  Patient needs a walker to treat with the following condition  Answer:  Total knee replacement status   08/26/19 1625   08/26/19 1626  DME Bedside commode  Once    Question:  Patient needs a bedside commode to treat with the following condition  Answer:  Total knee replacement status   08/26/19 1625          Diagnostic Studies: Dg Knee Right Port  Result Date: 08/26/2019 CLINICAL DATA:  Status post right total knee replacement. EXAM: PORTABLE RIGHT KNEE - 1-2 VIEW COMPARISON:  None. FINDINGS: The right femoral and tibial components appear to  be well situated. Surgical drain is seen in the soft tissues anterior to distal femur. Surgical staples are noted. IMPRESSION: Status post right total knee arthroplasty. Electronically Signed   By: Marijo Conception M.D.   On: 08/26/2019 15:46    Disposition:   Discharge Instructions    Diet - low sodium heart healthy   Complete by: As directed  Increase activity slowly   Complete by: As directed       Follow-up Information    Watt Climes, PA On 09/09/2019.   Specialty: Physician Assistant Why: at 1:15pm Contact information: [place] [address] [phone]        Dereck Leep, MD On 10/08/2019.   Specialty: Orthopedic Surgery Why: at 2:00pm Contact information: [place] [address] [phone]            Signed: Watt Climes 08/27/2019, 8:13 AM

## 2019-08-27 NOTE — TOC Benefit Eligibility Note (Signed)
Transition of Care [facility]) Benefit Eligibility Note    Patient Details  Name: Debra Kelly MRN: [id number] Date of Birth: Sep 27, 1941   Medication/Dose: Enoxaparin 40mg  once daily for 14 days  Covered?: Yes  Prescription Coverage [facility]  Spoke with Person/Company/Phone Number:: Anderson Malta with [place] [employer] at [phone]  Co-Pay: $66.80 estimated copay  Prior Approval: No  Deductible: Unmet($250 deductible, unmet as of time of call.)    Dannette Barbara Phone Number: [phone] or [phone] 08/27/2019, 11:12 AM

## 2019-08-27 NOTE — Evaluation (Signed)
Physical Therapy Evaluation Patient Details Name: Debra Kelly MRN: [id number] DOB: [birth year] Today's Date: 08/27/2019   History of Present Illness  Pt is a 78 y/o female, s/p R TKA (08/26/2019) for degenerative arthrosis of the right knee. Pt with PMH including arthritis, asthma, breast cancer (right), HTN, hypothyroidism.  Clinical Impression  Pt tolerates session well today with minimal increase in pain and vitals remaining stable. Pt able to perform bed mobility and transfers with supervision/CGA and ambulates 50' with RW with min A. Pt demonstrates decreased L step length and R stance time, and decreased gait speed with ambulation, and requires cuing with transfers for RW management. Pt will benefit from continued skilled PT intervention for further education in safe mobility and HEP, and to address PT impairments of strength, balance, range of motion and pain to improve safety with functional mobility.    Follow Up Recommendations Home health PT    Equipment Recommendations  Rolling walker with 5" wheels    Recommendations for Other Services       Precautions / Restrictions Precautions Precautions: Fall Restrictions Weight Bearing Restrictions: Yes RLE Weight Bearing: Weight bearing as tolerated      Mobility  Bed Mobility Overal bed mobility: Needs Assistance Bed Mobility: Supine to Sit     Supine to sit: Supervision        Transfers Overall transfer level: Needs assistance Equipment used: Rolling walker (2 wheeled) Transfers: Sit to/from [employer] Transfers Sit to Stand: Min guard Stand pivot transfers: Min guard          Ambulation/Gait Ambulation/Gait assistance: Herbalist (Feet): 50 Feet Assistive device: Rolling walker (2 wheeled) Gait Pattern/deviations: Decreased step length - left;Decreased stance time - right;Step-to pattern     General Gait Details: Initially with step to pattern; able to increase L step length and  progress to step through pattern with increased distance.  Stairs            Wheelchair Mobility    Modified Rankin (Stroke Patients Only)       Balance Overall balance assessment: Needs assistance Sitting-balance support: Feet supported Sitting balance-Leahy Scale: Fair     Standing balance support: Bilateral upper extremity supported Standing balance-Leahy Scale: Fair                               Pertinent Vitals/Pain Pain Assessment: 0-10 Pain Score: 1  Pain Location: R knee and thigh Pain Descriptors / Indicators: Aching;Discomfort Pain Intervention(s): Limited activity within patient's tolerance;Ice applied;Repositioned;Monitored during session    [place] expects to be discharged to:: Private residence Living Arrangements: Alone Available Help at Discharge: Other (Comment)(patient's boyfriend, available PRN) Type of Home: House Home Access: Stairs to enter Entrance Stairs-Rails: None Entrance Stairs-Number of Steps: 3 stairs at entry, no handrails Home Layout: One level Home Equipment: Shower seat      Prior Function Level of Independence: Independent               Hand Dominance        Extremity/Trunk Assessment        Lower Extremity Assessment Lower Extremity Assessment: RLE deficits/detail RLE Deficits / Details: R knee ROM: +10 to 65 deg       Communication   Communication: No difficulties  Cognition Arousal/Alertness: Awake/alert Behavior During Therapy: WFL for tasks assessed/performed Overall Cognitive Status: Within Functional Limits for tasks assessed  General Comments      Exercises Total Joint Exercises Ankle Circles/Pumps: AROM;Both;20 reps Quad Sets: AROM;Right;10 reps Goniometric ROM: +10 to 65 deg Marching in Standing: AROM;Both;10 reps   Assessment/Plan    PT Assessment Patient needs continued PT services  PT Problem  List Decreased strength;Decreased balance;Pain;Decreased mobility;Decreased range of motion;Decreased activity tolerance       PT Treatment Interventions Gait training;Therapeutic exercise;Patient/family education    PT Goals (Current goals can be found in the Care Plan section)  Acute Rehab PT Goals Patient Stated Goal: to go home PT Goal Formulation: With patient Time For Goal Achievement: 09/10/19 Potential to Achieve Goals: Good    Frequency BID   Barriers to discharge        Co-evaluation               AM-PAC PT "6 Clicks" Mobility  Outcome Measure Help needed turning from your back to your side while in a flat bed without using bedrails?: A Little Help needed moving from lying on your back to sitting on the side of a flat bed without using bedrails?: A Little Help needed moving to and from a bed to a chair (including a wheelchair)?: A Little Help needed standing up from a chair using your arms (e.g., wheelchair or bedside chair)?: A Little Help needed to walk in hospital room?: A Little Help needed climbing 3-5 steps with a railing? : A Lot 6 Click Score: 17    End of Session Equipment Utilized During Treatment: Gait belt Activity Tolerance: Patient tolerated treatment well Patient left: in chair;with chair alarm set;with SCD's reapplied;with call bell/phone within reach;Other (comment)(with hand off to OT present) Nurse Communication: Other (comment)(pt voided and had BM in [facility]) PT Visit Diagnosis: Unsteadiness on feet (R26.81);Muscle weakness (generalized) (M62.81);Difficulty in walking, not elsewhere classified (R26.2);Pain Pain - Right/Left: Right Pain - part of body: Knee    Time: [id number] PT Time Calculation (min) (ACUTE ONLY): 45 min   Charges:   PT Evaluation $PT Eval Low Complexity: 1 Low PT Treatments $Gait Training: 8-22 mins $Therapeutic Exercise: 8-22 mins        Petra Kuba, PT, DPT 08/27/19, 10:27 AM

## 2019-08-28 NOTE — Progress Notes (Signed)
Physical Therapy Treatment Patient Details Name: Debra Kelly MRN: [id number] DOB: September 25, 1941 Today's Date: 08/28/2019    History of Present Illness Pt is a 78 y/o female, s/p R TKA (08/26/2019) for degenerative arthrosis of the right knee. Pt with PMH including arthritis, asthma, breast cancer (right), HTN, hypothyroidism.    PT Comments    Pt agreeable to PT; reports 3/10 pain in R knee. Pt demonstrating Mod I with bed mobility, Min guard with STS with cues on increased use of RLE and Min guard/S for ambulation. Pt educated in stair climbing requiring cues for technique with initial attempt, but demonstrates learning with continued practice; Min A. R knee ext/flexion range of motion 0-80 post stretching; educated on carryover technique/frequency for home. Pt prepared to discharge home.    Follow Up Recommendations  Home health PT     Equipment Recommendations  Rolling walker with 5" wheels(received)    Recommendations for Other Services       Precautions / Restrictions Precautions Precautions: Fall Restrictions Weight Bearing Restrictions: Yes RLE Weight Bearing: Weight bearing as tolerated    Mobility  Bed Mobility Overal bed mobility: Modified Independent Bed Mobility: Supine to Sit;Sit to Supine     Supine to sit: Modified independent (Device/Increase time) Sit to supine: Modified independent (Device/Increase time)   General bed mobility comments: Mild increased time  Transfers Overall transfer level: Needs assistance Equipment used: Rolling walker (2 wheeled) Transfers: Sit to/from Stand Sit to Stand: Min guard         General transfer comment: Cues for increased use of RLE  Ambulation/Gait Ambulation/Gait assistance: Min guard;Supervision Gait Distance (Feet): 100 Feet Assistive device: Rolling walker (2 wheeled) Gait Pattern/deviations: Step-through pattern;Decreased weight shift to right Gait velocity: decreased Gait velocity interpretation: <1.8  ft/sec, indicate of risk for recurrent falls General Gait Details: Good small stride, reciprocal pattern. Good R knee flexion with swing phase and stability with loading   Stairs Stairs: Yes Stairs assistance: Min assist Stair Management: One rail Left Number of Stairs: 4 General stair comments: Education and cues for proper sequence; demonstrates learning.    Wheelchair Mobility    Modified Rankin (Stroke Patients Only)       Balance Overall balance assessment: Needs assistance Sitting-balance support: Feet supported Sitting balance-Leahy Scale: Normal     Standing balance support: Bilateral upper extremity supported Standing balance-Leahy Scale: Fair                              Cognition Arousal/Alertness: Awake/alert Behavior During Therapy: WFL for tasks assessed/performed Overall Cognitive Status: Within Functional Limits for tasks assessed                                        Exercises Total Joint Exercises Quad Sets: Strengthening;Both;20 reps Long Arc Quad: AROM;Right;10 reps Knee Flexion: AROM;Right;10 reps(3 positions each rep with hold) Goniometric ROM: 0-80 Other Exercises Other Exercises: Educated on carryover stretching in flex/ext at home for technique, duration and frequency Other Exercises: Rolling walker adjusted for proper height for home use    General Comments        Pertinent Vitals/Pain Pain Assessment: 0-10 Pain Score: 3  Pain Descriptors / Indicators: Discomfort Pain Intervention(s): Monitored during session;Premedicated before session;Ice applied    Home Living  Prior Function            PT Goals (current goals can now be found in the care plan section) Progress towards PT goals: Progressing toward goals    Frequency    BID      PT Plan Current plan remains appropriate    Co-evaluation              AM-PAC PT "6 Clicks" Mobility   Outcome Measure   Help needed turning from your back to your side while in a flat bed without using bedrails?: None Help needed moving from lying on your back to sitting on the side of a flat bed without using bedrails?: None Help needed moving to and from a bed to a chair (including a wheelchair)?: A Little Help needed standing up from a chair using your arms (e.g., wheelchair or bedside chair)?: None Help needed to walk in hospital room?: A Little Help needed climbing 3-5 steps with a railing? : A Little 6 Click Score: 21    End of Session Equipment Utilized During Treatment: Gait belt Activity Tolerance: Patient tolerated treatment well Patient left: in bed;with call bell/phone within reach;Other (comment)(polar care in place) Nurse Communication: Other (comment);Mobility status(ready for discharge) PT Visit Diagnosis: Unsteadiness on feet (R26.81);Muscle weakness (generalized) (M62.81);Difficulty in walking, not elsewhere classified (R26.2);Pain Pain - Right/Left: Right Pain - part of body: Knee     Time: [id number] PT Time Calculation (min) (ACUTE ONLY): 32 min  Charges:  $Gait Training: 8-22 mins $Therapeutic Exercise: 8-22 mins                      Larae Grooms, PTA 08/28/2019, 11:12 AM

## 2019-08-28 NOTE — Progress Notes (Signed)
Discharge instructions reviewed with patient. Verbalized understanding. Stable condition. Washed incision and applied new dressing. Applied TED hose. Gave 2 extra honeycomb dressing. Patient was wheeled out to leave with her significant other.

## 2019-08-28 NOTE — TOC Transition Note (Signed)
Transition of Care [facility]) - CM/SW Discharge Note   Patient Details  Name: Debra Kelly MRN: [id number] Date of Birth: October 12, 1941  Transition of [facility]) CM/SW Contact:  Su Hilt, RN Phone Number: 08/28/2019, 9:13 AM   Clinical Narrative:     Patient to discharge home with [facility] for [facility] today [employer] brought a RW into the room for the patient to take home.  No other DME needs She is aware of the Lovenox price and can afford her medications. NO further needs  Final next level of care: Home w Home Health Services Barriers to Discharge: Barriers Resolved   Patient Goals and CMS Choice Patient states their goals for this hospitalization and ongoing recovery are:: go home CMS Medicare.gov Compare Post Acute Care list provided to:: Patient Choice offered to / list presented to : Patient  Discharge Placement                       Discharge Plan and Services   Discharge Planning Services: CM Consult Post Acute Care Choice: Home Health          DME Arranged: Walker rolling DME Agency: [employer] Date DME Agency Contacted: 08/27/19 Time DME Agency Contacted: [phone] Representative spoke with at DME Agency: [place]: PT [place]: [facility] (formerly [employer]) Date [place]: 08/27/19 Time [place]: [place] Representative spoke with at [place]: [place] ([place]) Interventions     Readmission Risk Interventions No flowsheet data found.

## 2019-08-28 NOTE — Progress Notes (Signed)
   Subjective: 2 Days Post-Op Procedure(s) (LRB): COMPUTER ASSISTED TOTAL KNEE ARTHROPLASTY (Right) Patient reports pain as mild.   Patient is well, and has had no acute complaints or problems Did well with therapy yesterday.  Ambulated 80 feet and 100 feet.  However range of motion is slightly behind.  She was only able to flex to 60+ degrees. Plan is to go Home after hospital stay. no nausea and no vomiting Patient denies any chest pains or shortness of breath. Objective: Vital signs in last 24 hours: Temp:  [98.2 F (36.8 C)-98.5 F (36.9 C)] 98.2 F (36.8 C) (09/15 2327) Pulse Rate:  [73-96] 87 (09/15 2327) Resp:  [17-18] 18 (09/15 2327) BP: (121-145)/(58-73) 141/72 (09/15 2327) SpO2:  [94 %-100 %] 94 % (09/15 2327) well approximated incision Heels are non tender and elevated off the bed using rolled towels Intake/Output from previous day: 09/15 0701 - 09/16 0700 In: 885.4 [P.O.:240; I.V.:539.9; IV Piggyback:105.5] Out: 160 [Drains:160] Intake/Output this shift: Total I/O In: -  Out: 130 [Drains:130]  No results for input(s): HGB in the last 72 hours. No results for input(s): WBC, RBC, HCT, PLT in the last 72 hours. No results for input(s): NA, K, CL, CO2, BUN, CREATININE, GLUCOSE, CALCIUM in the last 72 hours. No results for input(s): LABPT, INR in the last 72 hours.  EXAM General - Patient is Alert, Appropriate and Oriented Extremity - Neurologically intact Neurovascular intact Sensation intact distally Intact pulses distally Dorsiflexion/Plantar flexion intact No cellulitis present Compartment soft Dressing - scant drainage Motor Function - intact, moving foot and toes well on exam.    Past Medical History:  Diagnosis Date  . Allergy   . Arthritis 09-13-12   arthritis- knees, right shoulder, finger  . Asthma    h/o  . Breast cancer ([place]) 04/16/15   right breastinvasive and in situ mammary   . Breast cancer of upper-outer quadrant of right female breast  ([place]) 04/20/2015  . Family history of adverse reaction to anesthesia    brother-n/v  . Hypertension   . Hypothyroidism 09-13-12   tx. supplement  . Personal history of radiation therapy   . Radiation 07/01/15-08/04/15   right breast    Assessment/Plan: 2 Days Post-Op Procedure(s) (LRB): COMPUTER ASSISTED TOTAL KNEE ARTHROPLASTY (Right) Active Problems:   Total knee replacement status  Estimated body mass index is 26.87 kg/m as calculated from the following:   Height as of this encounter: 5\' 4"  (1.626 m).   Weight as of this encounter: 71 kg. Up with therapy Discharge home with home health  Labs: None DVT Prophylaxis - Lovenox, Foot Pumps and TED hose Weight-Bearing as tolerated to right leg Hemovac was discontinued today.  Into the drain appeared to be intact. Please wash operative leg, change dressing and apply TED stockings to both legs prior to being discharged Please give the patient 2 extra honeycomb dressings to take home  [place]. [place] 08/28/2019, 7:00 AM

## 2019-09-10 ENCOUNTER — Encounter: Payer: Self-pay | Admitting: Hematology and Oncology

## 2019-09-18 ENCOUNTER — Other Ambulatory Visit: Payer: Self-pay | Admitting: Student in an Organized Health Care Education/Training Program

## 2019-09-18 DIAGNOSIS — M79662 Pain in left lower leg: Secondary | ICD-10-CM

## 2019-10-14 ENCOUNTER — Encounter: Payer: Self-pay | Admitting: Hematology and Oncology

## 2019-12-01 NOTE — Progress Notes (Signed)
Patient Care Team: Rusty Aus, MD as PCP - General (Internal Medicine) Alphonsa Overall, MD as Consulting Physician (General Surgery) Nicholas Lose, MD as Consulting Physician (Hematology and Oncology) Thea Silversmith, MD as Consulting Physician (Radiation Oncology) Mauro Kaufmann, RN as Registered Nurse Rockwell Germany, RN as Registered Nurse Holley Bouche, NP (Inactive) as Nurse Practitioner (Nurse Practitioner) Sylvan Cheese, NP as Nurse Practitioner (Hematology and Oncology)  DIAGNOSIS:    ICD-10-CM   1. Malignant neoplasm of upper-outer quadrant of right breast in female, estrogen receptor positive ([place])  C50.411    Z17.0     SUMMARY OF ONCOLOGIC HISTORY: Oncology History  Breast cancer of upper-outer quadrant of right female breast ([place])  04/03/2015 Mammogram   LEFT breast: circumscribed oval mass in the outer quadrant measuring ~12 mm. This is posterior to a second oval mass that is unchanged from prior exams. 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 No chemotherapy Lindi Adie).   05/08/2015 Pathologic Stage   Stage IA: pT1c pN0   07/01/2015 - 08/04/2015 Radiation Therapy   Adjuvant RT Pablo Ledger): Right breast 50 Gy over 25 fractions   08/17/2015 -  Anti-estrogen oral therapy   Anastrozole 1 mg daily ([place]).  Planned duration of therapy 5 years.   09/24/2015 Survivorship   Survivorship visit completed and copy of care plan provided to patient.     CHIEF COMPLIANT: Follow-up pf right breast cancer on anastrozole therapy   INTERVAL HISTORY: Debra Kelly is a 78 y.o. with above-mentioned history of right breast cancer treated with lumpectomy, adjuvant radiation, and who is currently on anastrozole. Mammogram on 05/15/19 showed no evidence of malignancy bilaterally. She presents to the clinic today for follow-up.   REVIEW OF SYSTEMS:   Constitutional: Denies fevers, chills or abnormal weight loss Eyes: Denies blurriness of vision Ears, nose, mouth, throat, and face: Denies mucositis or sore throat Respiratory: Denies cough, dyspnea or wheezes Cardiovascular: Denies palpitation, chest discomfort Gastrointestinal: Denies nausea, heartburn or change in bowel habits Skin: Denies abnormal skin rashes Lymphatics: Denies new lymphadenopathy or easy bruising Neurological: Denies numbness, tingling or new weaknesses Behavioral/Psych: Mood is stable, no new changes  Extremities: No lower extremity edema Breast: denies any pain or lumps or nodules in either breasts All other systems were reviewed with the patient and are negative.  I have reviewed the past medical history, past surgical history, social history and family history with the patient and they are unchanged from previous note.  ALLERGIES:  is allergic to shellfish allergy; lisinopril; radiaplexrx [skin protectants, misc.]; adhesive [tape]; amoxicillin-pot clavulanate; barbiturates; celecoxib; codeine; loratadine; and tapentadol.  MEDICATIONS:  Current Outpatient Medications  Medication Sig Dispense Refill  . acetaminophen (TYLENOL) 500 MG tablet Take 1,000 mg by mouth every 6 (six) hours as needed for moderate pain or headache.    . ALPRAZolam (  XANAX) 0.5 MG tablet Take 0.5 tablets (0.25 mg total) by mouth 2 (two) times daily as needed for  anxiety. (Patient taking differently: Take 0.5 mg by mouth at bedtime. ) 60 tablet 0  . anastrozole (ARIMIDEX) 1 MG tablet Take 1 tablet (1 mg total) by mouth daily. (Patient taking differently: Take 1 mg by mouth every morning. ) 90 tablet 3  . carboxymethylcellulose (REFRESH PLUS) 0.5 % SOLN Place 1 drop into both eyes 3 (three) times daily as needed (dry eyes).    . Cholecalciferol (VITAMIN D PO) Take 1,000 mcg by mouth daily.     . diphenhydrAMINE HCl, Sleep, 50 MG tablet Take 50 mg by mouth at bedtime as needed for sleep.    Marland Kitchen enoxaparin (LOVENOX) 40 MG/0.4ML injection Inject 0.4 mLs (40 mg total) into the skin daily for 14 days. 14 mL 0  . hydrochlorothiazide (HYDRODIURIL) 25 MG tablet Take 25 mg by mouth every other day.   11  . levothyroxine (SYNTHROID, LEVOTHROID) 175 MCG tablet Take 175 mcg by mouth daily before breakfast.    . loperamide (IMODIUM) 2 MG capsule Take 4 mg by mouth daily as needed for diarrhea or loose stools.    Marland Kitchen losartan (COZAAR) 25 MG tablet Take 50 mg by mouth every morning.     . Magnesium 500 MG CAPS Take 500 mg by mouth every morning.     . meloxicam (MOBIC) 15 MG tablet Take 15 mg by mouth daily.    . Multiple Vitamins-Minerals (MULTIVITAMIN ADULT PO) Take 1 tablet by mouth daily.    Marland Kitchen oxybutynin (DITROPAN) 5 MG tablet Take 5 mg by mouth every morning.     Marland Kitchen oxyCODONE (OXY IR/ROXICODONE) 5 MG immediate release tablet Take 1 tablet (5 mg total) by mouth every 4 (four) hours as needed for moderate pain (pain score 4-6). 30 tablet 0  . Potassium 99 MG TABS Take 1 tablet (99 mg total) by mouth 2 (two) times daily. (Patient taking differently: Take 99 mg by mouth at bedtime. ) 330 each 0  . traMADol (ULTRAM) 50 MG tablet Take 1-2 tablets (50-100 mg total) by mouth every 6 (six) hours as needed for moderate pain. 60 tablet 0  . vitamin B-12 (CYANOCOBALAMIN) 500 MCG tablet Take 500 mcg by mouth daily.    . vitamin C (ASCORBIC ACID) 500 MG tablet Take 500 mg by mouth at  bedtime as needed. Mostly takes in the wintertime     No current facility-administered medications for this visit.    PHYSICAL EXAMINATION: ECOG PERFORMANCE STATUS: 1 - Symptomatic but completely ambulatory  Vitals:   12/02/19 1408  BP: 129/70  Pulse: 87  Resp: 17  Temp: 98 F (36.7 C)  SpO2: 96%   Filed Weights   12/02/19 1408  Weight: 156 lb 1.6 oz (70.8 kg)    GENERAL: alert, no distress and comfortable SKIN: skin color, texture, turgor are normal, no rashes or significant lesions EYES: normal, Conjunctiva are pink and non-injected, sclera clear OROPHARYNX: no exudate, no erythema and lips, buccal mucosa, and tongue normal  NECK: supple, thyroid normal size, non-tender, without nodularity LYMPH: no palpable lymphadenopathy in the cervical, axillary or inguinal LUNGS: clear to auscultation and percussion with normal breathing effort HEART: regular rate & rhythm and no murmurs and no lower extremity edema ABDOMEN: abdomen soft, non-tender and normal bowel sounds MUSCULOSKELETAL: no cyanosis of digits and no clubbing  NEURO: alert & oriented x 3 with fluent speech, no focal motor/sensory deficits EXTREMITIES: No lower  extremity edema BREAST: No palpable masses or nodules in either right or left breasts. No palpable axillary supraclavicular or infraclavicular adenopathy no breast tenderness or nipple discharge. (exam performed in the presence of a chaperone)  LABORATORY DATA:  I have reviewed the data as listed CMP Latest Ref Rng & Units 08/16/2019 09/14/2017 04/22/2015  Glucose 70 - 99 mg/dL 107(H) 104(H) 110  BUN 8 - 23 mg/dL 34(H) 23(H) 15.1  Creatinine 0.44 - 1.00 mg/dL 0.91 0.72 0.7  Sodium 135 - 145 mmol/L 139 140 142  Potassium 3.5 - 5.1 mmol/L 3.4(L) 4.0 4.2  Chloride 98 - 111 mmol/L 101 103 -  CO2 22 - 32 mmol/L '28 27 26  ' Calcium 8.9 - 10.3 mg/dL 8.9 9.4 9.0  Total Protein 6.5 - 8.1 g/dL 6.6 - 6.7  Total Bilirubin 0.3 - 1.2 mg/dL 0.5 - 0.50  Alkaline Phos 38 - 126  U/L 54 - 67  AST 15 - 41 U/L 19 - 13  ALT 0 - 44 U/L 18 - 13    Lab Results  Component Value Date   WBC 6.5 08/16/2019   HGB 11.6 (L) 08/16/2019   HCT 37.0 08/16/2019   MCV 97.6 08/16/2019   PLT 278 08/16/2019   NEUTROABS 3.4 09/14/2017    ASSESSMENT & PLAN:  Breast cancer of upper-outer quadrant of right female breast Right breast lumpectomy 05/08/15: 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 Last bone density was done January 2019 at [facility] T score -0.8 normal  Anastrozole toxicities: Occasional hot flashes  Anxiety:On Xanax which is helping her tremendously  Breast cancer surveillance: 1.Breast exam 12/02/2019: Benign 2.mammogram 05/15/2019: No evidence of malignancy breast density category C  Laminectomy was done in 2018 She had a knee replacement surgery and is doing very well.  RTC in 1 year for follow up    No orders of the defined types were placed in this encounter.  The patient has a good understanding of the overall plan. she agrees with it. she will call with any problems that may develop before the next visit here.  Nicholas Lose, MD 12/02/2019  Julious Oka Dorshimer, am acting as scribe for Dr. Nicholas Lose.  I have reviewed the above document for accuracy and completeness, and I agree with the above.

## 2019-12-02 ENCOUNTER — Other Ambulatory Visit: Payer: Self-pay

## 2019-12-02 ENCOUNTER — Ambulatory Visit: Payer: Medicare HMO | Admitting: Hematology and Oncology

## 2019-12-02 ENCOUNTER — Inpatient Hospital Stay: Payer: Medicare HMO | Attending: Hematology and Oncology | Admitting: Hematology and Oncology

## 2019-12-02 DIAGNOSIS — Z79811 Long term (current) use of aromatase inhibitors: Secondary | ICD-10-CM | POA: Insufficient documentation

## 2019-12-02 DIAGNOSIS — Z170 Estrogen receptor positive status [ER+]: Secondary | ICD-10-CM | POA: Diagnosis not present

## 2019-12-02 DIAGNOSIS — Z923 Personal history of irradiation: Secondary | ICD-10-CM | POA: Insufficient documentation

## 2019-12-02 DIAGNOSIS — C50411 Malignant neoplasm of upper-outer quadrant of right female breast: Secondary | ICD-10-CM | POA: Insufficient documentation

## 2019-12-02 DIAGNOSIS — F419 Anxiety disorder, unspecified: Secondary | ICD-10-CM | POA: Diagnosis not present

## 2019-12-02 MED ORDER — ANASTROZOLE 1 MG PO TABS: 1.0000 mg | ORAL_TABLET | ORAL | 3 refills | Status: DC

## 2019-12-02 NOTE — Assessment & Plan Note (Signed)
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 Last bone density was done January 2019 at [facility] T score -0.8 normal  Anastrozole toxicities: Occasional hot flashes  Anxiety:On Xanax which is helping her tremendously Patient thinks that she needs bilateral knee replacement surgeries.  Breast cancer surveillance: 1.Breast exam 12/02/2019: Benign 2.mammogram 05/15/2019: No evidence of malignancy breast density category C  Laminectomy was done in 2018  RTC in 1 year for follow up

## 2019-12-30 DIAGNOSIS — E559 Vitamin D deficiency, unspecified: Secondary | ICD-10-CM | POA: Insufficient documentation

## 2020-01-02 ENCOUNTER — Ambulatory Visit: Payer: Medicare HMO | Attending: Internal Medicine

## 2020-01-02 DIAGNOSIS — Z23 Encounter for immunization: Secondary | ICD-10-CM | POA: Insufficient documentation

## 2020-01-02 NOTE — Progress Notes (Signed)
   Covid-19 Vaccination Clinic  Name:  Debra Kelly    MRN: [id number] DOB: [birth year]  01/02/2020  [name] was observed post Covid-19 immunization for 15 minutes without incidence. She was provided with Vaccine Information Sheet and instruction to access the V-Safe system.   [name] was instructed to call 911 with any severe reactions post vaccine: Marland Kitchen Difficulty breathing  . Swelling of your face and throat  . A fast heartbeat  . A bad rash all over your body  . Dizziness and weakness    Immunizations Administered    Name Date Dose VIS Date Route   Pfizer COVID-19 Vaccine 01/02/2020 10:30 AM 0.3 mL 11/22/2019 Intramuscular   Manufacturer: [place]   Lot: [id number]   [place]: [id number]

## 2020-01-23 ENCOUNTER — Other Ambulatory Visit: Payer: Self-pay

## 2020-01-23 ENCOUNTER — Ambulatory Visit: Payer: Medicare HMO | Attending: Internal Medicine

## 2020-01-23 DIAGNOSIS — Z23 Encounter for immunization: Secondary | ICD-10-CM | POA: Insufficient documentation

## 2020-01-23 NOTE — Progress Notes (Signed)
   Covid-19 Vaccination Clinic  Name:  Debra Kelly    MRN: [id number] DOB: 1941/09/05  01/23/2020  [name] was observed post Covid-19 immunization for 15 minutes without incidence. She was provided with Vaccine Information Sheet and instruction to access the V-Safe system.   Ms. Test was instructed to call 911 with any severe reactions post vaccine: Marland Kitchen Difficulty breathing  . Swelling of your face and throat  . A fast heartbeat  . A bad rash all over your body  . Dizziness and weakness    Immunizations Administered    Name Date Dose VIS Date Route   Pfizer COVID-19 Vaccine 01/23/2020 11:15 AM 0.3 mL 11/22/2019 Intramuscular   Manufacturer: [employer], [employer]   Lot: [id number]   [place]: [id number]

## 2020-02-17 ENCOUNTER — Other Ambulatory Visit: Payer: Self-pay | Admitting: Hematology and Oncology

## 2020-02-17 DIAGNOSIS — Z9889 Other specified postprocedural states: Secondary | ICD-10-CM

## 2020-02-17 DIAGNOSIS — Z853 Personal history of malignant neoplasm of breast: Secondary | ICD-10-CM

## 2020-03-01 DIAGNOSIS — M1712 Unilateral primary osteoarthritis, left knee: Secondary | ICD-10-CM | POA: Diagnosis present

## 2020-05-15 ENCOUNTER — Other Ambulatory Visit: Payer: Self-pay

## 2020-05-15 ENCOUNTER — Ambulatory Visit
Admission: RE | Admit: 2020-05-15 | Discharge: 2020-05-15 | Disposition: A | Discharge status: Home / Self Care | Payer: Medicare HMO | Source: Ambulatory Visit | Attending: Hematology and Oncology | Admitting: Hematology and Oncology

## 2020-05-15 DIAGNOSIS — Z9889 Other specified postprocedural states: Secondary | ICD-10-CM

## 2020-05-15 DIAGNOSIS — Z853 Personal history of malignant neoplasm of breast: Secondary | ICD-10-CM

## 2020-11-10 ENCOUNTER — Telehealth: Payer: Self-pay | Admitting: Hematology and Oncology

## 2020-11-10 NOTE — Telephone Encounter (Signed)
Rescheduled appointment per 11/30 sch msg. Spoke to patient who is aware of appointment date and time.

## 2020-12-01 ENCOUNTER — Ambulatory Visit: Payer: Medicare HMO | Admitting: Hematology and Oncology

## 2020-12-02 NOTE — Progress Notes (Signed)
Patient Care Team: Rusty Aus, MD as PCP - General (Internal Medicine) Alphonsa Overall, MD as Consulting Physician (General Surgery) Nicholas Lose, MD as Consulting Physician (Hematology and Oncology) Thea Silversmith, MD as Consulting Physician (Radiation Oncology) Mauro Kaufmann, RN as Registered Nurse Rockwell Germany, RN as Registered Nurse Holley Bouche, NP (Inactive) as Nurse Practitioner (Nurse Practitioner) Sylvan Cheese, NP as Nurse Practitioner (Hematology and Oncology)  DIAGNOSIS:    ICD-10-CM   1. Malignant neoplasm of upper-outer quadrant of right breast in female, estrogen receptor positive ([place])  C50.411    Z17.0     SUMMARY OF ONCOLOGIC HISTORY: Oncology History  Breast cancer of upper-outer quadrant of right female breast ([place])  04/03/2015 Mammogram   LEFT breast: circumscribed oval mass in the outer quadrant measuring ~12 mm. This is posterior to a second oval mass that is unchanged from prior exams. 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 No chemotherapy Lindi Adie).   05/08/2015 Pathologic Stage   Stage IA: pT1c pN0   07/01/2015 - 08/04/2015 Radiation Therapy   Adjuvant RT Pablo Ledger): Right breast 50 Gy over 25 fractions   08/17/2015 -  Anti-estrogen oral therapy   Anastrozole 1 mg daily (Haani Bakula).  Planned duration of therapy 5 years.   09/24/2015 Survivorship   Survivorship visit completed and copy of care plan provided to patient.     CHIEF COMPLIANT: Follow-up pf right breast cancer on anastrozole therapy  INTERVAL HISTORY: Debra Kelly is a 79 y.o. with above-mentioned history of right breast cancer treated with lumpectomy, adjuvant radiation, and who is currently on anastrozole. Mammogram on 05/15/20 showed no evidence of malignancy bilaterally. She presents to the clinic today for follow-up.   She is tolerating anastrozole extremely well.  ALLERGIES:  is allergic to shellfish allergy; lisinopril; radiaplexrx [skin protectants, misc.]; adhesive [tape]; amoxicillin-pot clavulanate; barbiturates; celecoxib; codeine; loratadine; and tapentadol.  MEDICATIONS:  Current Outpatient Medications  Medication Sig Dispense Refill  . acetaminophen (TYLENOL) 500 MG tablet Take 1,000 mg by mouth every 6 (six) hours as needed for moderate pain or headache.    . ALPRAZolam (XANAX) 0.5 MG tablet Take 0.5 tablets (0.25 mg total) by mouth 2 (two) times daily as needed for anxiety. (Patient taking differently: Take 0.5 mg by mouth at bedtime. ) 60 tablet 0  . anastrozole (ARIMIDEX) 1 MG tablet Take 1 tablet (1 mg total) by mouth every morning. 90 tablet 3  . carboxymethylcellulose (REFRESH PLUS) 0.5 % SOLN Place 1 drop into both eyes 3 (three) times daily as needed (dry eyes).    . Cholecalciferol (VITAMIN D PO) Take 1,000 mcg by mouth daily.     . diphenhydrAMINE HCl, Sleep, 50 MG tablet Take 50 mg by mouth at bedtime as needed for sleep.    . hydrochlorothiazide (HYDRODIURIL) 25 MG tablet Take 25 mg  by mouth every other day.   11  . levothyroxine (SYNTHROID, LEVOTHROID) 175 MCG tablet Take 175 mcg by mouth daily before breakfast.    . loperamide (IMODIUM) 2 MG capsule Take 4 mg by mouth daily as needed for diarrhea or loose stools.    Marland Kitchen losartan (COZAAR) 25 MG tablet Take 50 mg by mouth every  morning.     . Magnesium 500 MG CAPS Take 500 mg by mouth every morning.     . meloxicam (MOBIC) 15 MG tablet Take 15 mg by mouth daily.    . Multiple Vitamins-Minerals (MULTIVITAMIN ADULT PO) Take 1 tablet by mouth daily.    Marland Kitchen oxybutynin (DITROPAN) 5 MG tablet Take 5 mg by mouth every morning.     . Potassium 99 MG TABS Take 1 tablet (99 mg total) by mouth 2 (two) times daily. (Patient taking differently: Take 99 mg by mouth at bedtime. ) 330 each 0  . traMADol (ULTRAM) 50 MG tablet Take 1-2 tablets (50-100 mg total) by mouth every 6 (six) hours as needed for moderate pain. 60 tablet 0  . vitamin B-12 (CYANOCOBALAMIN) 500 MCG tablet Take 500 mcg by mouth daily.    . vitamin C (ASCORBIC ACID) 500 MG tablet Take 500 mg by mouth at bedtime as needed. Mostly takes in the wintertime     No current facility-administered medications for this visit.    PHYSICAL EXAMINATION: ECOG PERFORMANCE STATUS: 1 - Symptomatic but completely ambulatory  Vitals:   12/03/20 0858  BP: 136/71  Pulse: (!) 108  Resp: 17  Temp: 98.3 F (36.8 C)  SpO2: 97%   Filed Weights   12/03/20 0858  Weight: 164 lb 8 oz (74.6 kg)    BREAST: No palpable masses or nodules in either right or left breasts. No palpable axillary supraclavicular or infraclavicular adenopathy no breast tenderness or nipple discharge. (exam performed in the presence of a chaperone)  LABORATORY DATA:  I have reviewed the data as listed CMP Latest Ref Rng & Units 08/16/2019 09/14/2017 04/22/2015  Glucose 70 - 99 mg/dL 107(H) 104(H) 110  BUN 8 - 23 mg/dL 34(H) 23(H) 15.1  Creatinine 0.44 - 1.00 mg/dL 0.91 0.72 0.7  Sodium 135 - 145 mmol/L 139 140 142  Potassium 3.5 - 5.1 mmol/L 3.4(L) 4.0 4.2  Chloride 98 - 111 mmol/L 101 103 -  CO2 22 - 32 mmol/L _0 Calcium 8.9 - 10.3 mg/dL 8.9 9.4 9.0  Total Protein 6.5 - 8.1 g/dL 6.6 - 6.7  Total Bilirubin 0.3 - 1.2 mg/dL 0.5 - 0.50  Alkaline Phos 38 - 126 U/L 54 - 67  AST 15 - 41 U/L 19 - 13  ALT  0 - 44 U/L 18 - 13    Lab Results  Component Value Date   WBC 6.5 08/16/2019   HGB 11.6 (L) 08/16/2019   HCT 37.0 08/16/2019   MCV 97.6 08/16/2019   PLT 278 08/16/2019   NEUTROABS 3.4 09/14/2017    ASSESSMENT & PLAN:  Breast cancer of upper-outer quadrant of right female breast Right breast lumpectomy 05/08/15: 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 Last  bone density was done January 2019 at [facility] T score -0.8 normal  Anastrozole toxicities:Occasional hot flashes  Anxiety:On Xanax which is helping her tremendously  Breast cancer surveillance: 1.Breast exam  12/03/2020: Benign 2.mammogram  05/15/2020: No evidence of malignancy breast density category C  Laminectomywas done in 2018 She had a knee replacement surgery and is doing very well. Her significant other passed away earlier in the year and her son-in-law passed away last week. RTC in 1 year for follow up     No orders of the defined types were placed in this encounter.  The patient has a good understanding of the overall plan. she agrees with it. she will call with any problems that may develop before the next visit here.  Total time spent: 20 mins including face to face time and time spent for planning, charting and coordination of care  Nicholas Lose, MD 12/03/2020  I, Cloyde Reams Dorshimer, am acting as scribe for Dr. Nicholas Lose.  I have reviewed the above documentation for accuracy and completeness, and I agree with the above.

## 2020-12-03 ENCOUNTER — Inpatient Hospital Stay: Payer: Medicare HMO | Attending: Hematology and Oncology | Admitting: Hematology and Oncology

## 2020-12-03 ENCOUNTER — Other Ambulatory Visit: Payer: Self-pay

## 2020-12-03 DIAGNOSIS — Z170 Estrogen receptor positive status [ER+]: Secondary | ICD-10-CM | POA: Insufficient documentation

## 2020-12-03 DIAGNOSIS — F419 Anxiety disorder, unspecified: Secondary | ICD-10-CM | POA: Insufficient documentation

## 2020-12-03 DIAGNOSIS — C50411 Malignant neoplasm of upper-outer quadrant of right female breast: Secondary | ICD-10-CM | POA: Diagnosis not present

## 2020-12-03 DIAGNOSIS — Z923 Personal history of irradiation: Secondary | ICD-10-CM | POA: Insufficient documentation

## 2020-12-03 MED ORDER — ANASTROZOLE 1 MG PO TABS: 1.0000 mg | ORAL_TABLET | ORAL | 3 refills | Status: DC

## 2020-12-03 NOTE — Assessment & Plan Note (Signed)
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 Last bone density was done January 2019 at [facility] T score -0.8 normal  Anastrozole toxicities:Occasional hot flashes  Anxiety:On Xanax which is helping her tremendously  Breast cancer surveillance: 1.Breast exam  12/03/2020: Benign 2.mammogram  05/15/2020: No evidence of malignancy breast density category C  Laminectomywas done in 2018 She had a knee replacement surgery and is doing very well.  RTC in 1 year for follow up

## 2021-03-29 ENCOUNTER — Other Ambulatory Visit: Payer: Self-pay | Admitting: Hematology and Oncology

## 2021-03-29 DIAGNOSIS — Z1231 Encounter for screening mammogram for malignant neoplasm of breast: Secondary | ICD-10-CM

## 2021-05-07 NOTE — Discharge Instructions (Signed)
Instructions after Total Knee Replacement   Keziyah Kneale P. Leda Bellefeuille, Jr., M.D.     Dept. of Orthopaedics & Sports Medicine  [facility]  [address]  [place], [place]  [address]  Phone: [phone]   Fax: [phone]    DIET: Drink plenty of non-alcoholic fluids. Resume your normal diet. Include foods high in fiber.  ACTIVITY:  You may use crutches or a walker with weight-bearing as tolerated, unless instructed otherwise. You may be weaned off of the walker or crutches by your Physical Therapist.  Do NOT place pillows under the knee. Anything placed under the knee could limit your ability to straighten the knee.   Continue doing gentle exercises. Exercising will reduce the pain and swelling, increase motion, and prevent muscle weakness.   Please continue to use the TED compression stockings for 6 weeks. You may remove the stockings at night, but should reapply them in the morning. Do not drive or operate any equipment until instructed.  WOUND CARE:  Continue to use the PolarCare or ice packs periodically to reduce pain and swelling. You may bathe or shower after the staples are removed at the first office visit following surgery.  MEDICATIONS: You may resume your regular medications. Please take the pain medication as prescribed on the medication. Do not take pain medication on an empty stomach. You have been given a prescription for a blood thinner (Lovenox or Coumadin). Please take the medication as instructed. (NOTE: After completing a 2 week course of Lovenox, take one Enteric-coated aspirin once a day. This along with elevation will help reduce the possibility of phlebitis in your operated leg.) Do not drive or drink alcoholic beverages when taking pain medications.  CALL THE OFFICE FOR: Temperature above 101 degrees Excessive bleeding or drainage on the dressing. Excessive swelling, coldness, or paleness of the toes. Persistent nausea and vomiting.  FOLLOW-UP:  You  should have an appointment to return to the office in 10-14 days after surgery. Arrangements have been made for continuation of Physical Therapy (either home therapy or outpatient therapy).   [facility] Department Directory         [email or web address]       [email or web address]          Cardiology  Appointments: [place] - [phone] [place] - [phone]  Endocrinology  Appointments: [place] - [phone] [place] - [phone]  Gastroenterology  Appointments: [place] - [phone] [place] - [phone]        General Surgery   Appointments: [place] - [phone]  Internal Medicine/Family Medicine  Appointments: [place] - [phone] [place] - [phone] [place] - [phone]  Metabolic and Weigh Loss Surgery  Appointments: [place] - [phone]        Neurology  Appointments: [place] - [phone] [place] - [phone]  Neurosurgery  Appointments: [place] - [phone]  Obstetrics & Gynecology  Appointments: [place] - [phone] [place] - [phone]        Pediatrics  Appointments: [place] - [phone] [place] - [phone]  Physiatry  Appointments: [place] -[phone]  Physical Therapy  Appointments: [place] - [phone] [place] - [phone]        Podiatry  Appointments: [place] - [phone] [place] - [phone]  Pulmonology  Appointments: [place] - [phone]  Rheumatology  Appointments: [place] - [phone]        [place] Location: [facility]  [address] [place], [place]  [address]  [place] Location: [facility] [address] [place], [place]  [address]  [place] Location: [facility] [address] [place], [place]  [address]    

## 2021-05-11 ENCOUNTER — Other Ambulatory Visit: Payer: Medicare HMO

## 2021-05-13 ENCOUNTER — Other Ambulatory Visit
Admission: RE | Admit: 2021-05-13 | Discharge: 2021-05-13 | Disposition: A | Discharge status: Home / Self Care | Payer: Medicare HMO | Source: Ambulatory Visit | Attending: Orthopedic Surgery | Admitting: Orthopedic Surgery

## 2021-05-13 ENCOUNTER — Other Ambulatory Visit: Payer: Self-pay

## 2021-05-13 DIAGNOSIS — Z01818 Encounter for other preprocedural examination: Secondary | ICD-10-CM | POA: Insufficient documentation

## 2021-05-13 HISTORY — DX: Anxiety disorder, unspecified: F41.9

## 2021-05-13 LAB — URINALYSIS, ROUTINE W REFLEX MICROSCOPIC
Bilirubin Urine: NEGATIVE
Glucose, UA: NEGATIVE mg/dL
Hgb urine dipstick: NEGATIVE
Ketones, ur: NEGATIVE mg/dL
Leukocytes,Ua: NEGATIVE
Nitrite: NEGATIVE
Protein, ur: NEGATIVE mg/dL
Specific Gravity, Urine: 1.01 (ref 1.005–1.030)
pH: 5 (ref 5.0–8.0)

## 2021-05-13 LAB — CBC
HCT: 35.2 % — ABNORMAL LOW (ref 36.0–46.0)
Hemoglobin: 11.8 g/dL — ABNORMAL LOW (ref 12.0–15.0)
MCH: 30.7 pg (ref 26.0–34.0)
MCHC: 33.5 g/dL (ref 30.0–36.0)
MCV: 91.7 fL (ref 80.0–100.0)
Platelets: 248 10*3/uL (ref 150–400)
RBC: 3.84 MIL/uL — ABNORMAL LOW (ref 3.87–5.11)
RDW: 13.2 % (ref 11.5–15.5)
WBC: 5.7 10*3/uL (ref 4.0–10.5)
nRBC: 0 % (ref 0.0–0.2)

## 2021-05-13 LAB — COMPREHENSIVE METABOLIC PANEL
ALT: 13 U/L (ref 0–44)
AST: 20 U/L (ref 15–41)
Albumin: 4 g/dL (ref 3.5–5.0)
Alkaline Phosphatase: 63 U/L (ref 38–126)
Anion gap: 12 (ref 5–15)
BUN: 25 mg/dL — ABNORMAL HIGH (ref 8–23)
CO2: 25 mmol/L (ref 22–32)
Calcium: 9.2 mg/dL (ref 8.9–10.3)
Chloride: 98 mmol/L (ref 98–111)
Creatinine, Ser: 0.85 mg/dL (ref 0.44–1.00)
GFR, Estimated: >60 mL/min (ref >60)
Glucose, Bld: 108 mg/dL — ABNORMAL HIGH (ref 70–99)
Potassium: 3.5 mmol/L (ref 3.5–5.1)
Sodium: 135 mmol/L (ref 135–145)
Total Bilirubin: 0.6 mg/dL (ref 0.3–1.2)
Total Protein: 7.2 g/dL (ref 6.5–8.1)

## 2021-05-13 LAB — TYPE AND SCREEN
ABO/RH(D): A NEG
Antibody Screen: NEGATIVE

## 2021-05-13 LAB — APTT: aPTT: 35 seconds (ref 24–36)

## 2021-05-13 LAB — SEDIMENTATION RATE: Sed Rate: 45 mm/hr — ABNORMAL HIGH (ref 0–30)

## 2021-05-13 LAB — SURGICAL PCR SCREEN
MRSA, PCR: NEGATIVE
Staphylococcus aureus: NEGATIVE

## 2021-05-13 LAB — C-REACTIVE PROTEIN: CRP: 0.6 mg/dL (ref <1.0)

## 2021-05-13 LAB — PROTIME-INR
INR: 1 (ref 0.8–1.2)
Prothrombin Time: 13.7 seconds (ref 11.4–15.2)

## 2021-05-13 NOTE — Patient Instructions (Addendum)
Your procedure is scheduled on:05-24-21 MONDAY Report to the Registration Desk on the 1st floor of the [facility]-Then proceed to the 2nd floor Surgery Desk in the [place] To find out your arrival time, please call [phone] between 1PM - 3PM on:05-21-21 FRIDAY  REMEMBER: Instructions that are not followed completely may result in serious medical risk, up to and including death; or upon the discretion of your surgeon and anesthesiologist your surgery may need to be rescheduled.  Do not eat food after midnight the night before surgery.  No gum chewing, lozengers or hard candies.  You may however, drink CLEAR liquids up to 2 hours before you are scheduled to arrive for your surgery. Do not drink anything within 2 hours of your scheduled arrival time.  Clear liquids include: - water  - apple juice without pulp - gatorade  - black coffee or tea (Do NOT add milk or creamers to the coffee or tea) Do NOT drink anything that is not on this list.  In addition, your doctor has ordered for you to drink the provided  Ensure Pre-Surgery Clear Carbohydrate Drink  Drinking this carbohydrate drink up to two hours before surgery helps to reduce insulin resistance and improve patient outcomes. Please complete drinking 2 hours prior to scheduled arrival time.  TAKE THESE MEDICATIONS THE MORNING OF SURGERY WITH A SIP OF WATER: -ANASTROZOLE (ARIMIDEX) -SYNTHROID (LEVOTHYROXINE) -OXYBUTYNIN (DITROPAN) -YOU MAY XANAX (ALPRAZOLAM) THE DAY OF SURGERY IF NEEDED  One week prior to surgery: Stop Anti-inflammatories (NSAIDS) such as Advil, Aleve, Ibuprofen, Motrin, Naproxen, Naprosyn and Aspirin based products such as Excedrin, Goodys Powder, BC Powder.You may however, continue to take Tylenol or Tramadol if needed for pain up until the day of surgery.  Stop ANY OVER THE COUNTER supplements/vitamins 7 days prior to surgery  No Alcohol for 24 hours before or after surgery.  No Smoking including  e-cigarettes for 24 hours prior to surgery.  No chewable tobacco products for at least 6 hours prior to surgery.  No nicotine patches on the day of surgery.  Do not use any "recreational" drugs for at least a week prior to your surgery.  Please be advised that the combination of cocaine and anesthesia may have negative outcomes, up to and including death. If you test positive for cocaine, your surgery will be cancelled.  On the morning of surgery brush your teeth with toothpaste and water, you may rinse your mouth with mouthwash if you wish. Do not swallow any toothpaste or mouthwash.  Do not wear jewelry, make-up, hairpins, clips or nail polish.  Do not wear lotions, powders, or perfumes.   Do not shave body from the neck down 48 hours prior to surgery just in case you cut yourself which could leave a site for infection.  Also, freshly shaved skin may become irritated if using the CHG soap.  Contact lenses, hearing aids and dentures may not be worn into surgery.  Do not bring valuables to the hospital. [facility] is not responsible for any missing/lost belongings or valuables.   Use CHG Soap as directed on instruction sheet.  Notify your doctor if there is any change in your medical condition (cold, fever, infection).  Wear comfortable clothing (specific to your surgery type) to the hospital.  Plan for stool softeners for home use; pain medications have a tendency to cause constipation. You can also help prevent constipation by eating foods high in fiber such as fruits and vegetables and drinking plenty of fluids as your  diet allows.  After surgery, you can help prevent lung complications by doing breathing exercises.  Take deep breaths and cough every 1-2 hours. Your doctor may order a device called an Incentive Spirometer to help you take deep breaths. When coughing or sneezing, hold a pillow firmly against your incision with both hands. This is called "splinting." Doing this  helps protect your incision. It also decreases belly discomfort.  If you are being admitted to the hospital overnight, leave your suitcase in the car. After surgery it may be brought to your room.  If you are being discharged the day of surgery, you will not be allowed to drive home. You will need a responsible adult (18 years or older) to drive you home and stay with you that night.   If you are taking public transportation, you will need to have a responsible adult (18 years or older) with you. Please confirm with your physician that it is acceptable to use public transportation.   Please call the [place] Dept. at [phone] if you have any questions about these instructions.  Surgery Visitation Policy:  Patients undergoing a surgery or procedure may have one family member or support person with them as long as that person is not COVID-19 positive or experiencing its symptoms.  That person may remain in the waiting area during the procedure.  Inpatient Visitation:    Visiting hours are 7 a.m. to 8 p.m. Inpatients will be allowed two visitors daily. The visitors may change each day during the patient's stay. No visitors under the age of 58. Any visitor under the age of 46 must be accompanied by an adult. The visitor must pass COVID-19 screenings, use hand sanitizer when entering and exiting the patient's room and wear a mask at all times, including in the patient's room. Patients must also wear a mask when staff or their visitor are in the room. Masking is required regardless of vaccination status.  Total Hip/Knee Replacement Preoperative Educational Video  To better prepare for surgery, please view our videos that explain the physical activity and discharge planning required to have the best surgical recovery at [facility].  [email or web address]      Questions? Call [phone] or email [email or web address]

## 2021-05-13 NOTE — Pre-Procedure Instructions (Signed)
ECG 12-lead  Component 1 yr ago  Vent Rate (bpm)  81   PR Interval (msec)  148   QRS Interval (msec)  108   QT Interval (msec)  408   QTc (msec)  473    Narrative  Normal sinus rhythm  Incomplete right bundle branch block  Left ventricular hypertrophy with repolarization abnormality  Abnormal ECG  No previous ECGs available  I reviewed and concur with this report. Electronically signed AD:GNPHQN MD, [place] [phone]) on 07/08/2019 5:58:45 PM  Other Result Text  This result has an attachment that is not available. Specimen Collected: -- Last Resulted: --  Date: 06/27/19   Received From: [facility] Encounter

## 2021-05-15 LAB — URINE CULTURE
Culture: <10000 — AB
Special Requests: NORMAL

## 2021-05-19 ENCOUNTER — Other Ambulatory Visit: Payer: Self-pay

## 2021-05-19 ENCOUNTER — Ambulatory Visit
Admission: RE | Admit: 2021-05-19 | Discharge: 2021-05-19 | Disposition: A | Discharge status: Home / Self Care | Payer: Medicare HMO | Source: Ambulatory Visit | Attending: Hematology and Oncology | Admitting: Hematology and Oncology

## 2021-05-19 DIAGNOSIS — Z1231 Encounter for screening mammogram for malignant neoplasm of breast: Secondary | ICD-10-CM

## 2021-05-20 ENCOUNTER — Other Ambulatory Visit
Admission: RE | Admit: 2021-05-20 | Discharge: 2021-05-20 | Disposition: A | Discharge status: Home / Self Care | Payer: Medicare HMO | Source: Ambulatory Visit | Attending: Orthopedic Surgery | Admitting: Orthopedic Surgery

## 2021-05-20 DIAGNOSIS — Z20822 Contact with and (suspected) exposure to covid-19: Secondary | ICD-10-CM | POA: Insufficient documentation

## 2021-05-20 DIAGNOSIS — Z01812 Encounter for preprocedural laboratory examination: Secondary | ICD-10-CM | POA: Diagnosis present

## 2021-05-20 LAB — SARS CORONAVIRUS 2 (TAT 6-24 HRS): SARS Coronavirus 2: NEGATIVE

## 2021-05-23 MED ORDER — LACTATED RINGERS IV SOLN: INTRAVENOUS | Status: DC

## 2021-05-23 MED ORDER — TRANEXAMIC ACID-NACL 1000-0.7 MG/100ML-% IV SOLN
1000.0000 mg | INTRAVENOUS | Status: AC
  Administered 2021-05-24: 1000 mg via INTRAVENOUS

## 2021-05-23 MED ORDER — FAMOTIDINE 20 MG PO TABS: 20.0000 mg | ORAL_TABLET | Freq: Once | ORAL | Status: AC

## 2021-05-23 MED ORDER — GABAPENTIN 300 MG PO CAPS: 300.0000 mg | ORAL_CAPSULE | Freq: Once | ORAL | Status: AC

## 2021-05-23 MED ORDER — DEXAMETHASONE SODIUM PHOSPHATE 10 MG/ML IJ SOLN: 8.0000 mg | Freq: Once | INTRAMUSCULAR | Status: AC

## 2021-05-23 MED ORDER — CHLORHEXIDINE GLUCONATE 0.12 % MT SOLN: 15.0000 mL | Freq: Once | OROMUCOSAL | Status: AC

## 2021-05-23 MED ORDER — ORAL CARE MOUTH RINSE: 15.0000 mL | Freq: Once | OROMUCOSAL | Status: AC

## 2021-05-23 MED ORDER — CEFAZOLIN SODIUM-DEXTROSE 2-4 GM/100ML-% IV SOLN
2.0000 g | INTRAVENOUS | Status: AC
  Administered 2021-05-24: 2 g via INTRAVENOUS

## 2021-05-23 MED ORDER — CHLORHEXIDINE GLUCONATE 4 % EX LIQD: 60.0000 mL | Freq: Once | CUTANEOUS | Status: DC

## 2021-05-23 NOTE — H&P (Signed)
ORTHOPAEDIC HISTORY & PHYSICAL Gwenlyn Fudge, [place] - 05/14/2021 1:15 PM EDT Formatting of this note is different from the original. [facility] Chief Complaint:   Chief Complaint  Patient presents with   Knee Pain  H & P LEFT KNEE   History of Present Illness:   Debra Kelly is a 80 y.o. female that presents to clinic today for her preoperative history and evaluation. Patient presents unaccompanied. The patient is scheduled to undergo a left total knee arthroplasty on 05/24/21 by Dr. Marry Guan. Her pain began several years ago. The pain is located along the medial and lateral aspects of the knee. She describes her pain as worse with weightbearing. She reports associated swelling with some giving way of the knee. She denies associated numbness or tingling, denies locking.   The patient's symptoms have progressed to the point that they decrease her quality of life. The patient has previously undergone conservative treatment including NSAIDS, Tylenol, and activity modification without adequate control of her symptoms.  Patient denies significant cardiac history, history of blood clots, or hardware I nthe lumbar spine.   Her daughter will be home helping her after surgery.  Past Medical, Surgical, Family, Social History, Allergies, Medications:   Past Medical History:  Past Medical History:  Diagnosis Date   Acquired hypothyroidism, unspecified 11/11/2015   Allergy Codeine  Cellebrex   DDD (degenerative disc disease), lumbar 12/26/2017  Postop L5-S1 laminectomy, Izora Ribas, 9/18   Hyperlipidemia, mixed   Hypertension   Lobular carcinoma of right breast (CMS-HCC) 12/15/2015  Stage I, LN negative, post right lumpectomy, radiation x5 weeks, 2016   Low back pain radiating to both legs   Osteoarthritis   Pernicious anemia 02/23/2016  156, 1/17   Past Surgical History:  Past Surgical History:  Procedure Laterality Date   APPENDECTOMY 1988    CATARACT EXTRACTION Bilateral 2019   HYSTERECTOMY   KNEE ARTHROSCOPY   lumbar decompression L5-S1 09/20/2017  Dr Meade Maw   Right lumpectomy 2016   Right total knee arthroplasty using computer-assisted navigation 08/26/2019  Dr Marry Guan   TONSILLECTOMY 1956   TUBAL LIGATION 1980   Current Medications:  Current Outpatient Medications  Medication Sig Dispense Refill   acetaminophen (TYLENOL) 500 MG tablet Take 500 mg by mouth once daily as needed   ALPRAZolam (XANAX) 0.5 MG tablet TAKE 1 TABLET (0.5 MG TOTAL) BY MOUTH ONCE DAILY AS NEEDED FOR SLEEP 90 tablet 1   amoxicillin (AMOXIL) 500 MG capsule TAKE 4 CAPSULES (2,000 MG TOTAL) BY MOUTH ONCE FOR 1 DOSE ONE HOUR BEFORE YOUR DENTAL APPT.   anastrozole (ARIMIDEX) 1 mg tablet TAKE 1 TABLET BY MOUTH EVERY DAY 90 tablet 1   calcium carbonate-vitamin D3 (CALTRATE 600+D) 600 mg(1,500mg ) -400 unit tablet Take 1 tablet by mouth once daily   carboxymethylcellulose (REFRESH PLUS) 0.5 % ophthalmic solution Apply 2 drops to eye 2 (two) times daily as needed   cetirizine (ZYRTEC) 10 MG tablet Take 10 mg by mouth as needed   CYANOCOBALAMIN, VITAMIN B-12, (VITAMIN B-12 SL) Place 500 mcg under the tongue once daily.   diphenhydrAMINE (BENADRYL) 50 MG capsule Take by mouth   docusate (COLACE) 100 MG capsule Take 100 mg by mouth once daily as needed   fluticasone propionate (FLONASE) 50 mcg/actuation nasal spray Place into one nostril   glucos-msm-collagen-C-Mn-hrb21 500-333-5 mg Cap Take 2 tsp/30 mL by mouth daily with breakfast   hydroCHLOROthiazide (HYDRODIURIL) 25 MG tablet TAKE 1 TABLET BY MOUTH EVERY DAY 90  tablet 3   levothyroxine (SYNTHROID) 175 MCG tablet TAKE 1 TABLET (175 MCG TOTAL) BY MOUTH ONCE DAILY TAKE ON AN EMPTY STOMACH WITH A GLASS OF WATER AT LEAST 30-60 MINUTES BEFORE BREAKFAST. 90 tablet 1   loperamide HCl/simethicone (ANTI-DIARRHEAL, LOPE,-ANTI-GAS ORAL) Take 1 tablet by mouth once daily as needed   losartan (COZAAR) 25 MG  tablet TAKE 2 TABLETS BY MOUTH ONCE DAILY. 180 tablet 3   magnesium oxide 500 mg Tab Take 1 tablet by mouth once daily   multivit-min/iron/folic acid/K (ADULTS MULTIVITAMIN ORAL) Take 1 caplet by mouth once daily   oxybutynin (DITROPAN) 5 mg tablet TAKE 1 TABLET BY MOUTH EVERY DAY 90 tablet 3   potassium gluconate 550 mg (90 mg) Tab Take 1 tablet by mouth once daily   rosuvastatin (CRESTOR) 5 MG tablet Take 1 tablet (5 mg total) by mouth once daily 90 tablet 3   traMADoL (ULTRAM) 50 mg tablet TAKE 1 TABLET BY MOUTH THREE TIMES A DAY (Patient taking differently: Take 50 mg by mouth 2 (two) times daily as needed) 90 tablet 5   No current facility-administered medications for this visit.   Allergies:  Allergies  Allergen Reactions   Shellfish Containing Products Shortness Of Breath   Celecoxib Hives and Other (See Comments)  "made me feel funny" "made me feel funny"   Adhesive Tape-Silicones Rash  Paper tape ok   Amoxicillin-Pot Clavulanate Diarrhea   Barbiturates Rash   Codeine Rash   Lisinopril Cough   Secobarbital Rash   Skin Protectants, Misc. Rash  Used on skin during cancer treatment   Tapentadol Rash   Social History:  Social History   Socioeconomic History   Marital status: Widowed   Number of children: 2   Years of education: 12+  Occupational History   Occupation: Retired  Tobacco Use   Smoking status: Never Smoker   Smokeless tobacco: Never Used  Scientific laboratory technician Use: Never used  Substance and Sexual Activity   Alcohol use: Yes  Alcohol/week: 1.0 standard drink  Types: 1 Standard drinks or equivalent per week  Comment: 1 drink weekends   Drug use: No   Sexual activity: Yes  Partners: Male  Birth control/protection: Post-menopausal   Family History:  Family History  Problem Relation Age of Onset   Emphysema Mother   Asthma Mother   High blood pressure (Hypertension) Mother   Thyroid disease Mother   Coronary Artery Disease (Blocked arteries around  heart) Father   Prostate cancer Brother   Asthma Brother   Prostate cancer Brother   Review of Systems:   A 10+ ROS was performed, reviewed, and the pertinent orthopaedic findings are documented in the HPI.   Physical Examination:   BP 120/76 (BP Location: Left upper arm, Patient Position: Sitting, BP Cuff Size: Adult)  Ht 157.5 cm (5\' 2" )  Wt 74.3 kg (163 lb 12.8 oz)  BMI 29.96 kg/m   Patient is a well-developed, well-nourished female in no acute distress. Patient has normal mood and affect. Patient is alert and oriented to person, place, and time.   HEENT: Atraumatic, normocephalic. Pupils equal and reactive to light. Extraocular motion intact. Noninjected sclera.  Cardiovascular: Irregular rate and rhythm, with no murmurs, rubs, or gallops. Distal pulses palpable. No bruits.   Respiratory: Lungs clear to auscultation bilaterally.   Left Knee: Soft tissue swelling: mild Effusion: none Erythema: none Crepitance: mild Tenderness: Medial and lateral tenderness Alignment: relative valgus Mediolateral laxity: lateral pseudolaxity Posterior sag: negative Patellar tracking: Good  tracking without evidence of subluxation or tilt Atrophy: No significant atrophy.  Quadriceps tone was fair to good. Range of motion: 0/0/124 degrees  Sensation intact over the saphenous, lateral sural cutaneous, superficial fibular, and deep fibular nerve distributions.  Tests Performed/Reviewed:  X-rays  No new x-rays obtained. Previous x-rays show severe loss of lateral and medial compartment joint space with osteophyte formation. No fractures or dislocations noted. Valgus alignment noted.  Impression:   ICD-10-CM  1. Primary osteoarthritis of left knee M17.12   Plan:   The patient has end-stage degenerative changes of the left knee. It was explained to the patient that the condition is progressive in nature. Having failed conservative treatment, the patient has elected to proceed with a  total joint arthroplasty. The patient will undergo a total joint arthroplasty with Dr. Marry Guan. The risks of surgery, including blood clot and infection, were discussed with the patient. Measures to reduce these risks, including the use of anticoagulation, perioperative antibiotics, and early ambulation were discussed. The importance of postoperative physical therapy was discussed with the patient. The patient elects to proceed with surgery. The patient is instructed to stop all blood thinners prior to surgery. The patient is instructed to call the hospital the day before surgery to learn of the proper arrival time.   Contact our office with any questions or concerns. Follow up as indicated, or sooner should any new problems arise, if conditions worsen, or if they are otherwise concerned.   Gwenlyn Fudge, [facility] [place], [place] [address] Phone: [phone]  This note was generated in part with voice recognition software and I apologize for any typographical errors that were not detected and corrected.  Electronically signed by Gwenlyn Fudge, PA at 05/14/2021 1:44 PM EDT

## 2021-05-24 ENCOUNTER — Inpatient Hospital Stay: Payer: Medicare HMO | Admitting: Anesthesiology

## 2021-05-24 ENCOUNTER — Other Ambulatory Visit: Payer: Self-pay

## 2021-05-24 ENCOUNTER — Inpatient Hospital Stay
Admission: RE | Admit: 2021-05-24 | Discharge: 2021-05-26 | DRG: 470 | Disposition: A | Discharge status: Home Health | Payer: Medicare HMO | Attending: Orthopedic Surgery | Admitting: Orthopedic Surgery

## 2021-05-24 ENCOUNTER — Encounter: Payer: Self-pay | Admitting: Orthopedic Surgery

## 2021-05-24 ENCOUNTER — Inpatient Hospital Stay: Payer: Medicare HMO

## 2021-05-24 ENCOUNTER — Encounter
Admission: RE | Disposition: A | Discharge status: Home Health | Payer: Self-pay | Source: Home / Self Care | Attending: Orthopedic Surgery

## 2021-05-24 DIAGNOSIS — Z79899 Other long term (current) drug therapy: Secondary | ICD-10-CM

## 2021-05-24 DIAGNOSIS — Z91048 Other nonmedicinal substance allergy status: Secondary | ICD-10-CM

## 2021-05-24 DIAGNOSIS — E039 Hypothyroidism, unspecified: Secondary | ICD-10-CM | POA: Diagnosis present

## 2021-05-24 DIAGNOSIS — I10 Essential (primary) hypertension: Secondary | ICD-10-CM | POA: Diagnosis present

## 2021-05-24 DIAGNOSIS — Z91013 Allergy to seafood: Secondary | ICD-10-CM

## 2021-05-24 DIAGNOSIS — F419 Anxiety disorder, unspecified: Secondary | ICD-10-CM | POA: Diagnosis present

## 2021-05-24 DIAGNOSIS — E782 Mixed hyperlipidemia: Secondary | ICD-10-CM | POA: Diagnosis present

## 2021-05-24 DIAGNOSIS — Z96651 Presence of right artificial knee joint: Secondary | ICD-10-CM | POA: Diagnosis present

## 2021-05-24 DIAGNOSIS — M25762 Osteophyte, left knee: Secondary | ICD-10-CM | POA: Diagnosis present

## 2021-05-24 DIAGNOSIS — G894 Chronic pain syndrome: Secondary | ICD-10-CM | POA: Diagnosis present

## 2021-05-24 DIAGNOSIS — Z8349 Family history of other endocrine, nutritional and metabolic diseases: Secondary | ICD-10-CM

## 2021-05-24 DIAGNOSIS — Z881 Allergy status to other antibiotic agents status: Secondary | ICD-10-CM | POA: Diagnosis not present

## 2021-05-24 DIAGNOSIS — Z79891 Long term (current) use of opiate analgesic: Secondary | ICD-10-CM

## 2021-05-24 DIAGNOSIS — E669 Obesity, unspecified: Secondary | ICD-10-CM | POA: Diagnosis present

## 2021-05-24 DIAGNOSIS — Z96659 Presence of unspecified artificial knee joint: Secondary | ICD-10-CM

## 2021-05-24 DIAGNOSIS — Z888 Allergy status to other drugs, medicaments and biological substances status: Secondary | ICD-10-CM

## 2021-05-24 DIAGNOSIS — Z885 Allergy status to narcotic agent status: Secondary | ICD-10-CM

## 2021-05-24 DIAGNOSIS — Z923 Personal history of irradiation: Secondary | ICD-10-CM

## 2021-05-24 DIAGNOSIS — Z90710 Acquired absence of both cervix and uterus: Secondary | ICD-10-CM | POA: Diagnosis not present

## 2021-05-24 DIAGNOSIS — Z853 Personal history of malignant neoplasm of breast: Secondary | ICD-10-CM

## 2021-05-24 DIAGNOSIS — Z6829 Body mass index (BMI) 29.0-29.9, adult: Secondary | ICD-10-CM

## 2021-05-24 DIAGNOSIS — Z79890 Hormone replacement therapy (postmenopausal): Secondary | ICD-10-CM

## 2021-05-24 DIAGNOSIS — M1712 Unilateral primary osteoarthritis, left knee: Secondary | ICD-10-CM | POA: Diagnosis present

## 2021-05-24 HISTORY — PX: KNEE ARTHROPLASTY: SHX992

## 2021-05-24 SURGERY — ARTHROPLASTY, KNEE, TOTAL, USING IMAGELESS COMPUTER-ASSISTED NAVIGATION
Anesthesia: Spinal | Site: Knee | Laterality: Left

## 2021-05-24 MED ORDER — DEXAMETHASONE SODIUM PHOSPHATE 10 MG/ML IJ SOLN
INTRAMUSCULAR | Status: AC
  Administered 2021-05-24: 8 mg via INTRAVENOUS
  Filled 2021-05-24: qty 1

## 2021-05-24 MED ORDER — PANTOPRAZOLE SODIUM 40 MG PO TBEC
40.0000 mg | DELAYED_RELEASE_TABLET | Freq: Two times a day (BID) | ORAL | Status: DC
  Administered 2021-05-24 – 2021-05-26 (×4): 40 mg via ORAL
  Filled 2021-05-24 (×4): qty 1

## 2021-05-24 MED ORDER — NEOMYCIN-POLYMYXIN B GU 40-200000 IR SOLN
Status: DC | PRN
  Administered 2021-05-24: 16 mL

## 2021-05-24 MED ORDER — OXYCODONE HCL 5 MG PO TABS
10.0000 mg | ORAL_TABLET | ORAL | Status: DC | PRN
  Administered 2021-05-25 – 2021-05-26 (×3): 10 mg via ORAL
  Filled 2021-05-24 (×3): qty 2

## 2021-05-24 MED ORDER — FENTANYL CITRATE (PF) 100 MCG/2ML IJ SOLN
25.0000 ug | INTRAMUSCULAR | Status: AC | PRN
  Administered 2021-05-24 (×4): 25 ug via INTRAVENOUS

## 2021-05-24 MED ORDER — ENSURE PRE-SURGERY PO LIQD
296.0000 mL | Freq: Once | ORAL | Status: AC
  Administered 2021-05-24: 296 mL via ORAL
  Filled 2021-05-24: qty 296

## 2021-05-24 MED ORDER — CEFAZOLIN SODIUM-DEXTROSE 2-4 GM/100ML-% IV SOLN
INTRAVENOUS | Status: AC
  Filled 2021-05-24: qty 100

## 2021-05-24 MED ORDER — LORATADINE 10 MG PO TABS
10.0000 mg | ORAL_TABLET | Freq: Every day | ORAL | Status: DC
  Administered 2021-05-25 – 2021-05-26 (×2): 10 mg via ORAL
  Filled 2021-05-24 (×2): qty 1

## 2021-05-24 MED ORDER — FENTANYL CITRATE (PF) 100 MCG/2ML IJ SOLN
INTRAMUSCULAR | Status: AC
  Administered 2021-05-24: 25 ug via INTRAVENOUS
  Filled 2021-05-24: qty 2

## 2021-05-24 MED ORDER — DIPHENHYDRAMINE HCL 12.5 MG/5ML PO ELIX: 12.5000 mg | ORAL_SOLUTION | ORAL | Status: DC | PRN

## 2021-05-24 MED ORDER — ANASTROZOLE 1 MG PO TABS
1.0000 mg | ORAL_TABLET | Freq: Every day | ORAL | Status: DC
  Administered 2021-05-25 – 2021-05-26 (×2): 1 mg via ORAL
  Filled 2021-05-24 (×2): qty 1

## 2021-05-24 MED ORDER — MIDAZOLAM HCL 2 MG/2ML IJ SOLN
INTRAMUSCULAR | Status: DC | PRN
  Administered 2021-05-24: 2 mg via INTRAVENOUS

## 2021-05-24 MED ORDER — CALCIUM 600-400 MG-UNIT PO CHEW: 1.0000 | CHEWABLE_TABLET | Freq: Every day | ORAL | Status: DC

## 2021-05-24 MED ORDER — ACETAMINOPHEN 500 MG PO TABS
1000.0000 mg | ORAL_TABLET | Freq: Four times a day (QID) | ORAL | Status: AC
  Administered 2021-05-24 – 2021-05-25 (×4): 1000 mg via ORAL
  Filled 2021-05-24 (×4): qty 2

## 2021-05-24 MED ORDER — MENTHOL 3 MG MT LOZG
1.0000 | LOZENGE | OROMUCOSAL | Status: DC | PRN
  Filled 2021-05-24: qty 9

## 2021-05-24 MED ORDER — FLEET ENEMA 7-19 GM/118ML RE ENEM: 1.0000 | ENEMA | Freq: Once | RECTAL | Status: DC | PRN

## 2021-05-24 MED ORDER — LEVOTHYROXINE SODIUM 50 MCG PO TABS
175.0000 ug | ORAL_TABLET | Freq: Every day | ORAL | Status: DC
  Administered 2021-05-25 – 2021-05-26 (×2): 175 ug via ORAL
  Filled 2021-05-24 (×2): qty 1

## 2021-05-24 MED ORDER — SODIUM CHLORIDE 0.9 % IV SOLN: INTRAVENOUS | Status: DC

## 2021-05-24 MED ORDER — FAMOTIDINE 20 MG PO TABS
ORAL_TABLET | ORAL | Status: AC
  Administered 2021-05-24: 20 mg via ORAL
  Filled 2021-05-24: qty 1

## 2021-05-24 MED ORDER — SODIUM CHLORIDE 0.9 % IV SOLN
INTRAVENOUS | Status: DC | PRN
  Administered 2021-05-24: 15 ug/min via INTRAVENOUS

## 2021-05-24 MED ORDER — POTASSIUM 99 MG PO TABS: 99.0000 mg | ORAL_TABLET | Freq: Every day | ORAL | Status: DC

## 2021-05-24 MED ORDER — TRANEXAMIC ACID-NACL 1000-0.7 MG/100ML-% IV SOLN
1000.0000 mg | Freq: Once | INTRAVENOUS | Status: AC
  Administered 2021-05-24: 1000 mg via INTRAVENOUS

## 2021-05-24 MED ORDER — SURGIPHOR WOUND IRRIGATION SYSTEM - OPTIME
TOPICAL | Status: DC | PRN
  Administered 2021-05-24: 1 via TOPICAL

## 2021-05-24 MED ORDER — BISACODYL 10 MG RE SUPP: 10.0000 mg | Freq: Every day | RECTAL | Status: DC | PRN

## 2021-05-24 MED ORDER — LOPERAMIDE HCL 2 MG PO CAPS: 4.0000 mg | ORAL_CAPSULE | Freq: Every day | ORAL | Status: DC | PRN

## 2021-05-24 MED ORDER — ROSUVASTATIN CALCIUM 5 MG PO TABS
5.0000 mg | ORAL_TABLET | Freq: Every day | ORAL | Status: DC
  Administered 2021-05-24 – 2021-05-25 (×2): 5 mg via ORAL
  Filled 2021-05-24 (×2): qty 1

## 2021-05-24 MED ORDER — ACETAMINOPHEN 10 MG/ML IV SOLN
INTRAVENOUS | Status: AC
  Filled 2021-05-24: qty 100

## 2021-05-24 MED ORDER — LOSARTAN POTASSIUM 50 MG PO TABS
50.0000 mg | ORAL_TABLET | ORAL | Status: DC
  Administered 2021-05-25 – 2021-05-26 (×2): 50 mg via ORAL
  Filled 2021-05-24 (×2): qty 1

## 2021-05-24 MED ORDER — ACETAMINOPHEN 325 MG PO TABS
325.0000 mg | ORAL_TABLET | Freq: Four times a day (QID) | ORAL | Status: DC | PRN
Start: 2021-05-25 — End: 2021-05-26

## 2021-05-24 MED ORDER — MIDAZOLAM HCL 2 MG/2ML IJ SOLN
INTRAMUSCULAR | Status: AC
  Filled 2021-05-24: qty 2

## 2021-05-24 MED ORDER — SENNOSIDES-DOCUSATE SODIUM 8.6-50 MG PO TABS
1.0000 | ORAL_TABLET | Freq: Two times a day (BID) | ORAL | Status: DC
  Administered 2021-05-24 – 2021-05-26 (×4): 1 via ORAL
  Filled 2021-05-24 (×4): qty 1

## 2021-05-24 MED ORDER — MELOXICAM 7.5 MG PO TABS
7.5000 mg | ORAL_TABLET | Freq: Every day | ORAL | Status: DC
  Filled 2021-05-24 (×3): qty 1

## 2021-05-24 MED ORDER — PHENOL 1.4 % MT LIQD
1.0000 | OROMUCOSAL | Status: DC | PRN
  Filled 2021-05-24: qty 177

## 2021-05-24 MED ORDER — ONDANSETRON HCL 4 MG PO TABS: 4.0000 mg | ORAL_TABLET | Freq: Four times a day (QID) | ORAL | Status: DC | PRN

## 2021-05-24 MED ORDER — GABAPENTIN 300 MG PO CAPS
ORAL_CAPSULE | ORAL | Status: AC
  Administered 2021-05-24: 300 mg via ORAL
  Filled 2021-05-24: qty 1

## 2021-05-24 MED ORDER — ALPRAZOLAM 0.5 MG PO TABS
0.5000 mg | ORAL_TABLET | Freq: Two times a day (BID) | ORAL | Status: DC | PRN
  Administered 2021-05-24 – 2021-05-25 (×2): 0.5 mg via ORAL
  Filled 2021-05-24 (×2): qty 1

## 2021-05-24 MED ORDER — FERROUS SULFATE 325 (65 FE) MG PO TABS
325.0000 mg | ORAL_TABLET | Freq: Two times a day (BID) | ORAL | Status: DC
  Administered 2021-05-24 – 2021-05-26 (×4): 325 mg via ORAL
  Filled 2021-05-24 (×4): qty 1

## 2021-05-24 MED ORDER — ALUM & MAG HYDROXIDE-SIMETH 200-200-20 MG/5ML PO SUSP: 30.0000 mL | ORAL | Status: DC | PRN

## 2021-05-24 MED ORDER — PROPOFOL 1000 MG/100ML IV EMUL
INTRAVENOUS | Status: AC
  Filled 2021-05-24: qty 100

## 2021-05-24 MED ORDER — BUPIVACAINE HCL (PF) 0.5 % IJ SOLN
INTRAMUSCULAR | Status: DC | PRN
  Administered 2021-05-24: 3 mL

## 2021-05-24 MED ORDER — FLUTICASONE PROPIONATE 50 MCG/ACT NA SUSP
1.0000 | NASAL | Status: DC | PRN
  Filled 2021-05-24: qty 16

## 2021-05-24 MED ORDER — MAGNESIUM HYDROXIDE 400 MG/5ML PO SUSP
30.0000 mL | Freq: Every day | ORAL | Status: DC
  Administered 2021-05-24 – 2021-05-26 (×3): 30 mL via ORAL
  Filled 2021-05-24 (×3): qty 30

## 2021-05-24 MED ORDER — ONDANSETRON HCL 4 MG/2ML IJ SOLN: 4.0000 mg | Freq: Once | INTRAMUSCULAR | Status: DC | PRN

## 2021-05-24 MED ORDER — DIPHENHYDRAMINE HCL (SLEEP) 50 MG PO TABS: 50.0000 mg | ORAL_TABLET | Freq: Every evening | ORAL | Status: DC | PRN

## 2021-05-24 MED ORDER — OXYBUTYNIN CHLORIDE 5 MG PO TABS
5.0000 mg | ORAL_TABLET | ORAL | Status: DC
  Administered 2021-05-25 – 2021-05-26 (×2): 5 mg via ORAL
  Filled 2021-05-24 (×2): qty 1

## 2021-05-24 MED ORDER — TRAMADOL HCL 50 MG PO TABS
50.0000 mg | ORAL_TABLET | ORAL | Status: DC | PRN
  Administered 2021-05-24: 50 mg via ORAL
  Administered 2021-05-25 (×2): 100 mg via ORAL
  Filled 2021-05-24: qty 1
  Filled 2021-05-24 (×2): qty 2

## 2021-05-24 MED ORDER — CEFAZOLIN SODIUM-DEXTROSE 2-4 GM/100ML-% IV SOLN
2.0000 g | Freq: Four times a day (QID) | INTRAVENOUS | Status: AC
  Administered 2021-05-24 (×2): 2 g via INTRAVENOUS
  Filled 2021-05-24 (×2): qty 100

## 2021-05-24 MED ORDER — FENTANYL CITRATE (PF) 100 MCG/2ML IJ SOLN
INTRAMUSCULAR | Status: DC | PRN
  Administered 2021-05-24 (×2): 50 ug via INTRAVENOUS

## 2021-05-24 MED ORDER — DOXYLAMINE SUCCINATE (SLEEP) 25 MG PO TABS
50.0000 mg | ORAL_TABLET | Freq: Every evening | ORAL | Status: DC | PRN
  Filled 2021-05-24: qty 2

## 2021-05-24 MED ORDER — OXYCODONE HCL 5 MG PO TABS
5.0000 mg | ORAL_TABLET | ORAL | Status: DC | PRN
  Administered 2021-05-24 – 2021-05-26 (×4): 5 mg via ORAL
  Filled 2021-05-24 (×4): qty 1

## 2021-05-24 MED ORDER — SODIUM CHLORIDE 0.9 % IV SOLN
INTRAVENOUS | Status: DC | PRN
  Administered 2021-05-24: 60 mL

## 2021-05-24 MED ORDER — BUPIVACAINE HCL (PF) 0.25 % IJ SOLN
INTRAMUSCULAR | Status: DC | PRN
  Administered 2021-05-24: 60 mL

## 2021-05-24 MED ORDER — CARBOXYMETHYLCELLULOSE SODIUM 0.5 % OP SOLN: 1.0000 [drp] | Freq: Three times a day (TID) | OPHTHALMIC | Status: DC | PRN

## 2021-05-24 MED ORDER — CHLORHEXIDINE GLUCONATE 0.12 % MT SOLN
OROMUCOSAL | Status: AC
  Administered 2021-05-24: 15 mL via OROMUCOSAL
  Filled 2021-05-24: qty 15

## 2021-05-24 MED ORDER — ACETAMINOPHEN 10 MG/ML IV SOLN: 1000.0000 mg | Freq: Four times a day (QID) | INTRAVENOUS | Status: DC

## 2021-05-24 MED ORDER — POLYVINYL ALCOHOL 1.4 % OP SOLN
1.0000 [drp] | Freq: Four times a day (QID) | OPHTHALMIC | Status: DC | PRN
  Filled 2021-05-24: qty 15

## 2021-05-24 MED ORDER — ACETAMINOPHEN 10 MG/ML IV SOLN
INTRAVENOUS | Status: DC | PRN
  Administered 2021-05-24: 1000 mg via INTRAVENOUS

## 2021-05-24 MED ORDER — FENTANYL CITRATE (PF) 100 MCG/2ML IJ SOLN
INTRAMUSCULAR | Status: AC
  Filled 2021-05-24: qty 2

## 2021-05-24 MED ORDER — HYDROCHLOROTHIAZIDE 25 MG PO TABS
25.0000 mg | ORAL_TABLET | Freq: Every day | ORAL | Status: DC
  Administered 2021-05-25 – 2021-05-26 (×2): 25 mg via ORAL
  Filled 2021-05-24 (×2): qty 1

## 2021-05-24 MED ORDER — HYDROMORPHONE HCL 1 MG/ML IJ SOLN: 0.5000 mg | INTRAMUSCULAR | Status: DC | PRN

## 2021-05-24 MED ORDER — METOCLOPRAMIDE HCL 10 MG PO TABS
10.0000 mg | ORAL_TABLET | Freq: Three times a day (TID) | ORAL | Status: DC
  Administered 2021-05-24 – 2021-05-25 (×5): 10 mg via ORAL
  Filled 2021-05-24 (×6): qty 1

## 2021-05-24 MED ORDER — CALCIUM CARBONATE-VITAMIN D 500-200 MG-UNIT PO TABS
1.0000 | ORAL_TABLET | Freq: Every day | ORAL | Status: DC
  Administered 2021-05-25 – 2021-05-26 (×2): 1 via ORAL
  Filled 2021-05-24 (×2): qty 1

## 2021-05-24 MED ORDER — ONDANSETRON HCL 4 MG/2ML IJ SOLN: 4.0000 mg | Freq: Four times a day (QID) | INTRAMUSCULAR | Status: DC | PRN

## 2021-05-24 MED ORDER — TRANEXAMIC ACID-NACL 1000-0.7 MG/100ML-% IV SOLN
INTRAVENOUS | Status: AC
  Filled 2021-05-24: qty 100

## 2021-05-24 MED ORDER — PROPOFOL 500 MG/50ML IV EMUL
INTRAVENOUS | Status: DC | PRN
  Administered 2021-05-24: 75 ug/kg/min via INTRAVENOUS

## 2021-05-24 MED ORDER — TRANEXAMIC ACID-NACL 1000-0.7 MG/100ML-% IV SOLN: 1000.0000 mg | Freq: Once | INTRAVENOUS | Status: DC

## 2021-05-24 MED ORDER — MAGNESIUM OXIDE -MG SUPPLEMENT 400 (240 MG) MG PO TABS
400.0000 mg | ORAL_TABLET | ORAL | Status: DC
  Administered 2021-05-25 – 2021-05-26 (×2): 400 mg via ORAL
  Filled 2021-05-24 (×2): qty 1

## 2021-05-24 MED ORDER — GLYCOPYRROLATE 0.2 MG/ML IJ SOLN
INTRAMUSCULAR | Status: DC | PRN
  Administered 2021-05-24: .2 mg via INTRAVENOUS

## 2021-05-24 MED ORDER — ENOXAPARIN SODIUM 30 MG/0.3ML IJ SOSY
30.0000 mg | PREFILLED_SYRINGE | Freq: Two times a day (BID) | INTRAMUSCULAR | Status: DC
  Administered 2021-05-25 – 2021-05-26 (×3): 30 mg via SUBCUTANEOUS
  Filled 2021-05-24 (×3): qty 0.3

## 2021-05-24 MED ORDER — CYANOCOBALAMIN 500 MCG PO TABS
500.0000 ug | ORAL_TABLET | Freq: Every day | ORAL | Status: DC
  Administered 2021-05-25 – 2021-05-26 (×2): 500 ug via ORAL
  Filled 2021-05-24 (×2): qty 1

## 2021-05-24 MED ORDER — ADULT MULTIVITAMIN W/MINERALS CH
1.0000 | ORAL_TABLET | Freq: Every day | ORAL | Status: DC
  Administered 2021-05-24 – 2021-05-25 (×2): 1 via ORAL
  Filled 2021-05-24 (×2): qty 1

## 2021-05-24 SURGICAL SUPPLY — 76 items
ATTUNE PS FEM LT SZ 5 CEM KNEE (Femur) ×2 IMPLANT
ATTUNE PSRP INSR SZ5 5 KNEE (Insert) ×1 IMPLANT
ATTUNE PSRP INSR SZ5 5MM KNEE (Insert) ×1 IMPLANT
BASE TIBIAL ROT PLAT SZ 5 KNEE (Knees) IMPLANT
BATTERY INSTRU NAVIGATION (MISCELLANEOUS) ×12 IMPLANT
BLADE SAW 70X12.5 (BLADE) ×3 IMPLANT
BLADE SAW 90X13X1.19 OSCILLAT (BLADE) ×3 IMPLANT
BLADE SAW 90X25X1.19 OSCILLAT (BLADE) ×3 IMPLANT
BSPLAT TIB 5 CMNT ROT PLAT STR (Knees) ×1 IMPLANT
BTRY SRG DRVR LF (MISCELLANEOUS) ×4
CEMENT HV SMART SET (Cement) ×4 IMPLANT
COOLER POLAR GLACIER W/PUMP (MISCELLANEOUS) ×3 IMPLANT
COVER WAND RF STERILE (DRAPES) ×3 IMPLANT
CUFF TOURN SGL QUICK 24 (TOURNIQUET CUFF) ×3
CUFF TOURN SGL QUICK 34 (TOURNIQUET CUFF)
CUFF TRNQT CYL 24X4X16.5-23 (TOURNIQUET CUFF) IMPLANT
CUFF TRNQT CYL 34X4.125X (TOURNIQUET CUFF) IMPLANT
DRAPE 3/4 80X56 (DRAPES) ×3 IMPLANT
DRSG DERMACEA 8X12 NADH (GAUZE/BANDAGES/DRESSINGS) ×3 IMPLANT
DRSG MEPILEX SACRM 8.7X9.8 (GAUZE/BANDAGES/DRESSINGS) ×3 IMPLANT
DRSG OPSITE POSTOP 4X12 (GAUZE/BANDAGES/DRESSINGS) ×2 IMPLANT
DRSG OPSITE POSTOP 4X14 (GAUZE/BANDAGES/DRESSINGS) ×3 IMPLANT
DRSG TEGADERM 4X4.75 (GAUZE/BANDAGES/DRESSINGS) ×3 IMPLANT
DURAPREP 26ML APPLICATOR (WOUND CARE) ×6 IMPLANT
ELECT CAUTERY BLADE 6.4 (BLADE) ×3 IMPLANT
ELECT REM PT RETURN 9FT ADLT (ELECTROSURGICAL) ×3
ELECTRODE REM PT RTRN 9FT ADLT (ELECTROSURGICAL) ×1 IMPLANT
EX-PIN ORTHOLOCK NAV 4X150 (PIN) ×6 IMPLANT
GLOVE SURG ENC MOIS LTX SZ7.5 (GLOVE) ×6 IMPLANT
GLOVE SURG ENC TEXT LTX SZ7.5 (GLOVE) ×6 IMPLANT
GLOVE SURG UNDER LTX SZ8 (GLOVE) ×3 IMPLANT
GLOVE SURG UNDER POLY LF SZ7.5 (GLOVE) ×3 IMPLANT
GOWN STRL REUS W/ TWL LRG LVL3 (GOWN DISPOSABLE) ×2 IMPLANT
GOWN STRL REUS W/ TWL XL LVL3 (GOWN DISPOSABLE) ×1 IMPLANT
GOWN STRL REUS W/TWL LRG LVL3 (GOWN DISPOSABLE) ×6
GOWN STRL REUS W/TWL XL LVL3 (GOWN DISPOSABLE) ×3
HEMOVAC 400CC 10FR (MISCELLANEOUS) ×3 IMPLANT
HOLDER FOLEY CATH W/STRAP (MISCELLANEOUS) ×3 IMPLANT
HOOD PEEL AWAY FLYTE STAYCOOL (MISCELLANEOUS) ×6 IMPLANT
IRRIGATION SURGIPHOR STRL (IV SOLUTION) ×3 IMPLANT
IV NS IRRIG 3000ML ARTHROMATIC (IV SOLUTION) ×3 IMPLANT
KIT TURNOVER KIT A (KITS) ×3 IMPLANT
KNIFE SCULPS 14X20 (INSTRUMENTS) ×3 IMPLANT
LABEL OR SOLS (LABEL) ×3 IMPLANT
MANIFOLD NEPTUNE II (INSTRUMENTS) ×6 IMPLANT
NDL SAFETY ECLIPSE 18X1.5 (NEEDLE) ×1 IMPLANT
NDL SPNL 20GX3.5 QUINCKE YW (NEEDLE) ×2 IMPLANT
NEEDLE HYPO 18GX1.5 SHARP (NEEDLE) ×3
NEEDLE SPNL 20GX3.5 QUINCKE YW (NEEDLE) ×6 IMPLANT
NS IRRIG 500ML POUR BTL (IV SOLUTION) ×3 IMPLANT
PACK TOTAL KNEE (MISCELLANEOUS) ×3 IMPLANT
PAD WRAPON POLAR KNEE (MISCELLANEOUS) ×1 IMPLANT
PATELLA MEDIAL ATTUN 35MM KNEE (Knees) ×2 IMPLANT
PENCIL SMOKE EVACUATOR COATED (MISCELLANEOUS) ×3 IMPLANT
PIN DRILL QUICK PACK ×3 IMPLANT
PIN FIXATION 1/8DIA X 3INL (PIN) ×9 IMPLANT
PULSAVAC PLUS IRRIG FAN TIP (DISPOSABLE) ×3
SOL PREP PVP 2OZ (MISCELLANEOUS) ×3
SOLUTION PREP PVP 2OZ (MISCELLANEOUS) ×1 IMPLANT
SPONGE DRAIN TRACH 4X4 STRL 2S (GAUZE/BANDAGES/DRESSINGS) ×3 IMPLANT
STAPLER SKIN PROX 35W (STAPLE) ×3 IMPLANT
STOCKINETTE IMPERV 14X48 (MISCELLANEOUS) ×2 IMPLANT
STRAP TIBIA SHORT (MISCELLANEOUS) ×3 IMPLANT
SUCTION FRAZIER HANDLE 10FR (MISCELLANEOUS) ×3
SUCTION TUBE FRAZIER 10FR DISP (MISCELLANEOUS) ×1 IMPLANT
SUT VIC AB 0 CT1 36 (SUTURE) ×6 IMPLANT
SUT VIC AB 1 CT1 36 (SUTURE) ×6 IMPLANT
SUT VIC AB 2-0 CT2 27 (SUTURE) ×3 IMPLANT
SYR 20ML LL LF (SYRINGE) ×3 IMPLANT
SYR 30ML LL (SYRINGE) ×6 IMPLANT
TIBIAL BASE ROT PLAT SZ 5 KNEE (Knees) ×3 IMPLANT
TIP FAN IRRIG PULSAVAC PLUS (DISPOSABLE) ×1 IMPLANT
TOWEL OR 17X26 4PK STRL BLUE (TOWEL DISPOSABLE) ×3 IMPLANT
TOWER CARTRIDGE SMART MIX (DISPOSABLE) ×3 IMPLANT
TRAY FOLEY MTR SLVR 16FR STAT (SET/KITS/TRAYS/PACK) ×3 IMPLANT
WRAPON POLAR PAD KNEE (MISCELLANEOUS) ×3

## 2021-05-24 NOTE — Transfer of Care (Signed)
Immediate Anesthesia Transfer of Care Note  Patient: Debra Kelly  Procedure(s) Performed: COMPUTER ASSISTED TOTAL KNEE ARTHROPLASTY (Left: Knee)  Patient Location: PACU  Anesthesia Type:Spinal  Level of Consciousness: sedated  Airway & Oxygen Therapy: Patient Spontanous Breathing and Patient connected to face mask oxygen  Post-op Assessment: Report given to RN and Post -op Vital signs reviewed and stable  Post vital signs: Reviewed and stable  Last Vitals:  Vitals Value Taken Time  BP 95/51 05/24/21 1528  Temp 37.3 C 05/24/21 1525  Pulse 97 05/24/21 1530  Resp 21 05/24/21 1530  SpO2 99 % 05/24/21 1530  Vitals shown include unvalidated device data.  Last Pain:  Vitals:   05/24/21 1525  TempSrc:   PainSc: Asleep         Complications: No notable events documented.

## 2021-05-24 NOTE — Anesthesia Preprocedure Evaluation (Signed)
Anesthesia Evaluation  Patient identified by MRN, date of birth, ID band Patient awake    Reviewed: Allergy & Precautions, NPO status , Patient's Chart, lab work & pertinent test results  History of Anesthesia Complications Negative for: history of anesthetic complications  Airway Mallampati: II  TM Distance: >3 FB Neck ROM: Full    Dental  (+) Partial Upper   Pulmonary asthma ,    breath sounds clear to auscultation- rhonchi (-) wheezing      Cardiovascular Exercise Tolerance: Good hypertension, Pt. on medications (-) CAD, (-) Past MI, (-) Cardiac Stents and (-) CABG  Rhythm:Regular Rate:Normal - Systolic murmurs and - Diastolic murmurs    Neuro/Psych neg Seizures Anxiety negative neurological ROS     GI/Hepatic negative GI ROS, Neg liver ROS,   Endo/Other  neg diabetesHypothyroidism   Renal/GU negative Renal ROS     Musculoskeletal  (+) Arthritis ,   Abdominal (+) - obese,   Peds  Hematology  (+) anemia ,   Anesthesia Other Findings Past Medical History: No date: Allergy No date: Anxiety 09-13-12: Arthritis     Comment:  arthritis- knees, right shoulder, finger No date: Asthma     Comment:  h/o 04/16/15: Breast cancer ([facility])     Comment:  right breastinvasive and in situ mammary  04/20/2015: Breast cancer of upper-outer quadrant of right female  breast ([facility]) No date: Family history of adverse reaction to anesthesia     Comment:  brother-n/v No date: Hypertension 09-13-12: Hypothyroidism     Comment:  tx. supplement No date: Personal history of radiation therapy 07/01/15-08/04/15: Radiation     Comment:  right breast   Reproductive/Obstetrics                             Lab Results  Component Value Date   WBC 5.7 05/13/2021   HGB 11.8 (L) 05/13/2021   HCT 35.2 (L) 05/13/2021   MCV 91.7 05/13/2021   PLT 248 05/13/2021    Anesthesia Physical Anesthesia Plan  ASA:  3  Anesthesia Plan: Spinal   Post-op Pain Management:    Induction:   PONV Risk Score and Plan: 2 and Ondansetron  Airway Management Planned: Natural Airway  Additional Equipment:   Intra-op Plan:   Post-operative Plan:   Informed Consent: I have reviewed the patients History and Physical, chart, labs and discussed the procedure including the risks, benefits and alternatives for the proposed anesthesia with the patient or authorized representative who has indicated his/her understanding and acceptance.     Dental advisory given  Plan Discussed with: CRNA and Anesthesiologist  Anesthesia Plan Comments:         Anesthesia Quick Evaluation

## 2021-05-24 NOTE — Op Note (Signed)
OPERATIVE NOTE  DATE OF SURGERY:  05/24/2021  PATIENT NAME:  Debra Kelly   DOB: [birth year]  MRN: [id number]  PRE-OPERATIVE DIAGNOSIS: Degenerative arthrosis of the left knee, primary  POST-OPERATIVE DIAGNOSIS:  Same  PROCEDURE:  Left total knee arthroplasty using computer-assisted navigation  SURGEON:  Marciano Sequin. M.D.  ASSISTANT: Benjaman Lobe, RNFA, (present and scrubbed throughout the case, critical for assistance with exposure, retraction, instrumentation, and closure)  ANESTHESIA: spinal  ESTIMATED BLOOD LOSS: 50 mL  FLUIDS REPLACED: 1200 mL of crystalloid  TOURNIQUET TIME: 95 minutes  DRAINS: 2 medium Hemovac drains  SOFT TISSUE RELEASES: Anterior cruciate ligament, posterior cruciate ligament, deep medial collateral ligament, patellofemoral ligament, and posterolateral corner  IMPLANTS UTILIZED: DePuy Attune size 5 posterior stabilized femoral component (cemented), size 5 rotating platform tibial component (cemented), 35 mm medialized dome patella (cemented), and a 5 mm stabilized rotating platform polyethylene insert.  INDICATIONS FOR SURGERY: Debra Kelly is a 80 y.o. year old female with a long history of progressive knee pain. X-rays demonstrated severe degenerative changes in tricompartmental fashion. The patient had not seen any significant improvement despite conservative nonsurgical intervention. After discussion of the risks and benefits of surgical intervention, the patient expressed understanding of the risks benefits and agree with plans for total knee arthroplasty.   The risks, benefits, and alternatives were discussed at length including but not limited to the risks of infection, bleeding, nerve injury, stiffness, blood clots, the need for revision surgery, cardiopulmonary complications, among others, and they were willing to proceed.  PROCEDURE IN DETAIL: The patient was brought into the operating room and, after adequate spinal anesthesia was  achieved, a tourniquet was placed on the patient's upper thigh. The patient's knee and leg were cleaned and prepped with alcohol and DuraPrep and draped in the usual sterile fashion. A "timeout" was performed as per usual protocol. The lower extremity was exsanguinated using an Esmarch, and the tourniquet was inflated to 300 mmHg. An anterior longitudinal incision was made followed by a standard mid vastus approach. The deep fibers of the medial collateral ligament were elevated in a subperiosteal fashion off of the medial flare of the tibia so as to maintain a continuous soft tissue sleeve. The patella was subluxed laterally and the patellofemoral ligament was incised. Inspection of the knee demonstrated severe degenerative changes with full-thickness loss of articular cartilage. Osteophytes were debrided using a rongeur. Anterior and posterior cruciate ligaments were excised. Two 4.0 mm Schanz pins were inserted in the femur and into the tibia for attachment of the array of trackers used for computer-assisted navigation. Hip center was identified using a circumduction technique. Distal landmarks were mapped using the computer. The distal femur and proximal tibia were mapped using the computer. The distal femoral cutting guide was positioned using computer-assisted navigation so as to achieve a 5 distal valgus cut. The femur was sized and it was felt that a size 5 femoral component was appropriate. A size 5 femoral cutting guide was positioned and the anterior cut was performed and verified using the computer. This was followed by completion of the posterior and chamfer cuts. Femoral cutting guide for the central box was then positioned in the center box cut was performed.  Attention was then directed to the proximal tibia. Medial and lateral menisci were excised. The extramedullary tibial cutting guide was positioned using computer-assisted navigation so as to achieve a 0 varus-valgus alignment and 3  posterior slope. The cut was performed and verified using the computer. The  proximal tibia was sized and it was felt that a size 5 tibial tray was appropriate. Tibial and femoral trials were inserted followed by insertion of a 5 mm polyethylene insert. The knee was felt to be tight laterally.  The trial components were removed and the knee was brought into full extension and distracted using the Moreland retractors.  The posterolateral corner was carefully released using a combination of electrocautery and Metzenbaum scissors.  Trial components were reinserted followed by placement of a 5 mm polyethylene trial.  This allowed for excellent mediolateral soft tissue balancing both in flexion and in full extension. Finally, the patella was cut and prepared so as to accommodate a 35 mm medialized dome patella. A patella trial was placed and the knee was placed through a range of motion with excellent patellar tracking appreciated. The femoral trial was removed after debridement of posterior osteophytes. The central post-hole for the tibial component was reamed followed by insertion of a keel punch. Tibial trials were then removed. Cut surfaces of bone were irrigated with copious amounts of normal saline using pulsatile lavage and then suctioned dry. Polymethylmethacrylate cement was prepared in the usual fashion using a vacuum mixer. Cement was applied to the cut surface of the proximal tibia as well as along the undersurface of a size 5 rotating platform tibial component. Tibial component was positioned and impacted into place. Excess cement was removed using [employer] fast streamer. Cement was then applied to the cut surfaces of the femur as well as along the posterior flanges of the size 5 femoral component. The femoral component was positioned and impacted into place. Excess cement was removed using [employer] fast streamer. A 5 mm polyethylene trial was inserted and the knee was brought into full extension with steady axial  compression applied. Finally, cement was applied to the backside of a 35 mm medialized dome patella and the patellar component was positioned and patellar clamp applied. Excess cement was removed using [employer] fast streamer. After adequate curing of the cement, the tourniquet was deflated after a total tourniquet time of 95 minutes. Hemostasis was achieved using electrocautery. The knee was irrigated with copious amounts of normal saline using pulsatile lavage followed by 500 ml of Surgiphor and then suctioned dry. 20 mL of 1.3% Exparel and 60 mL of 0.25% Marcaine in 40 mL of normal saline was injected along the posterior capsule, medial and lateral gutters, and along the arthrotomy site. A 5 mm stabilized rotating platform polyethylene insert was inserted and the knee was placed through a range of motion with excellent mediolateral soft tissue balancing appreciated and excellent patellar tracking noted. 2 medium drains were placed in the wound bed and brought out through separate stab incisions. The medial parapatellar portion of the incision was reapproximated using interrupted sutures of #1 Vicryl. Subcutaneous tissue was approximated in layers using first #0 Vicryl followed #2-0 Vicryl. The skin was approximated with skin staples. A sterile dressing was applied.  The patient tolerated the procedure well and was transported to the recovery room in stable condition.    Debra Kelly., M.D.

## 2021-05-24 NOTE — Plan of Care (Signed)

## 2021-05-24 NOTE — Anesthesia Procedure Notes (Addendum)
Spinal  Patient location during procedure: OR Start time: 05/24/2021 11:59 AM End time: 05/24/2021 12:01 PM Reason for block: surgical anesthesia Staffing Performed: resident/CRNA  Anesthesiologist: Emmie Niemann, MD Resident/CRNA: Aline Brochure, CRNA Preanesthetic Checklist Completed: patient identified, IV checked, site marked, risks and benefits discussed, surgical consent, monitors and equipment checked, pre-op evaluation and timeout performed Spinal Block Patient position: sitting Prep: ChloraPrep Patient monitoring: heart rate, continuous pulse ox, blood pressure and cardiac monitor Approach: midline Location: L3-4 Injection technique: single-shot Needle Needle type: Introducer and Pencan  Needle gauge: 24 G Needle length: 9 cm Assessment Sensory level: T10 Events: CSF return Additional Notes Negative paresthesia. Negative blood return. Positive free-flowing CSF. Expiration date of kit checked and confirmed. Patient tolerated procedure well, without complications.

## 2021-05-24 NOTE — Progress Notes (Signed)
Pt admitted to room 133 from PACU. Pt resting comfortably, pain is 3/10, dinner tray ordered and VSS. Family at bedside.   05/24/21 1714  Vitals  Temp 98.2 F (36.8 C)  Temp Source Oral  BP (!) 147/68  MAP (mmHg) 86  BP Location Left Arm  BP Method Automatic  Patient Position (if appropriate) Lying  Pulse Rate 94  Pulse Rate Source Monitor  Resp 16  Level of Consciousness  Level of Consciousness Alert  MEWS COLOR  MEWS Score Color Green  Oxygen Therapy  SpO2 98 %  O2 Device Room Air

## 2021-05-24 NOTE — H&P (Signed)
The patient has been re-examined, and the chart reviewed, and there have been no interval changes to the documented history and physical.    The risks, benefits, and alternatives have been discussed at length. The patient expressed understanding of the risks benefits and agreed with plans for surgical intervention.  Christophere Hillhouse P. Ebenezer Mccaskey, Jr. M.D.    

## 2021-05-25 ENCOUNTER — Encounter: Payer: Self-pay | Admitting: Orthopedic Surgery

## 2021-05-25 NOTE — Evaluation (Signed)
Occupational Therapy Evaluation Patient Details Name: Debra Kelly MRN: [id number] DOB: 10-27-1941 Today's Date: 05/25/2021    History of Present Illness 80 yo female s/p L TKA on 05/24/21. 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   Clinical Impression   Pt seen for OT evaluation this date, POD#1 from above surgery. Pt was independent in all ADL prior to surgery, doing her own lawn care, and only in the past week was using a SPC for mobility 2/2 L knee pain. Pt is eager to return to PLOF with less pain and improved safety and independence. Pt currently requires PRN minimal assist for LB dressing while in seated position due to pain and limited AROM of L knee. Pt instructed in polar care mgt, falls prevention strategies, home/routines modifications, DME/AE for LB bathing and dressing tasks, and compression stocking mgt. Pt verbalized understanding and reports plan for daughter to stay with her initially for recovery and will be able to provide needed level of assist. Do not currently anticipate any additional skilled OT needs. Will sign off.       Follow Up Recommendations  No OT follow up    Equipment Recommendations  3 in 1 bedside commode    Recommendations for Other Services       Precautions / Restrictions Precautions Precautions: Fall;Knee Precaution Booklet Issued: Yes (comment) Precaution Comments: indep with SLR, no KI required Restrictions Weight Bearing Restrictions: Yes LLE Weight Bearing: Weight bearing as tolerated      Mobility Bed Mobility Overal bed mobility: Modified Independent                  Transfers Overall transfer level: Needs assistance Equipment used: Rolling walker (2 wheeled) Transfers: Sit to/from Stand Sit to Stand: Min guard;Supervision         General transfer comment: good safe technique    Balance Overall balance assessment: No apparent  balance deficits (not formally assessed)                                         ADL either performed or assessed with clinical judgement   ADL Overall ADL's : Needs assistance/impaired                                       General ADL Comments: PRN MIN A for LB ADL and compression stocking mgt 2/2 decreased strength, ROM and L knee pain. Supervision + RW for ADL mobility and ADL transfers. Pt reports daughter able to assist.     Vision Baseline Vision/History: Wears glasses Wears Glasses: Reading only Patient Visual Report: No change from baseline       Perception     Praxis      Pertinent Vitals/Pain Pain Assessment: 0-10 Pain Score: 3  Pain Location: Anterior Knee Pain Descriptors / Indicators: Aching Pain Intervention(s): Limited activity within patient's tolerance;Monitored during session;Premedicated before session;Repositioned;Ice applied     Hand Dominance Left   Extremity/Trunk Assessment Upper Extremity Assessment Upper Extremity Assessment: Overall WFL for tasks assessed   Lower Extremity Assessment Lower Extremity Assessment: LLE deficits/detail LLE Deficits / Details: s/p TKE expected post-op strength/ROM deficits LLE Sensation: WNL       Communication Communication Communication: No difficulties   Cognition Arousal/Alertness: Awake/alert  Behavior During Therapy: WFL for tasks assessed/performed Overall Cognitive Status: Within Functional Limits for tasks assessed                                     General Comments       Exercises Other Exercises Other Exercises: Pt instructed in AE/DME, falls prevention, home/routines modifications, compression stocking mgt, polar care mgt; handout provided to support recall and carryover   Shoulder Instructions      Home Living Family/patient expects to be discharged to:: Private residence Living Arrangements: Alone Available Help at Discharge:  Family;Available 24 hours/day (daughter planning to stay with her initially) Type of Home: House Home Access: Stairs to enter [employer] of Steps: 2+1 with L rail from back entrance (primary) Entrance Stairs-Rails: Left Home Layout: One level     Bathroom Shower/Tub: Teacher, early years/pre: Handicapped height     Home Equipment: Shower seat;Grab bars - toilet;Grab bars - tub/shower;Walker - 2 wheels;Cane - single point          Prior Functioning/Environment Level of Independence: Independent with assistive device(s)        Comments: Pt only recently requiring SPC for balance 2/2 L knee pain, indep with mobility prior to that, indep with ADL, IADL, including lawn care for her home        OT Problem List: Decreased strength;Pain;Decreased range of motion      OT Treatment/Interventions:      OT Goals(Current goals can be found in the care plan section) Acute Rehab OT Goals Patient Stated Goal: go home with dtr OT Goal Formulation: All assessment and education complete, DC therapy  OT Frequency:     Barriers to D/C:            Co-evaluation              AM-PAC OT "6 Clicks" Daily Activity     Outcome Measure Help from another person eating meals?: None Help from another person taking care of personal grooming?: None Help from another person toileting, which includes using toliet, bedpan, or urinal?: A Little Help from another person bathing (including washing, rinsing, drying)?: A Little Help from another person to put on and taking off regular upper body clothing?: None Help from another person to put on and taking off regular lower body clothing?: A Little 6 Click Score: 21   End of Session Equipment Utilized During Treatment: Rolling walker Nurse Communication: Mobility status  Activity Tolerance: Patient tolerated treatment well Patient left: in chair;with call bell/phone within reach;with chair alarm set;Other (comment) (rolled  towel under L ankle, polar care in place)  OT Visit Diagnosis: Other abnormalities of gait and mobility (R26.89);Pain Pain - Right/Left: Left Pain - part of body: Knee                Time: 5974-1638 OT Time Calculation (min): 29 min Charges:  OT General Charges $OT Visit: 1 Visit OT Evaluation $OT Eval Low Complexity: 1 Low OT Treatments $Self Care/Home Management : 23-37 mins  Hanley Hays, MPH, MS, OTR/L ascom [phone] 05/25/21, 10:06 AM

## 2021-05-25 NOTE — Evaluation (Addendum)
Physical Therapy Evaluation Patient Details Name: Debra Kelly MRN: [id number] DOB: 1941-06-04 Today's Date: 05/25/2021   History of Present Illness  80 yo female s/p L TKA on 05/24/21. 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    Clinical Impression  Pt received seated in recliner upon arrival to room.  Pt agreeable to therapy.  Pt is able to stand and perform seated exercises prior to coming into standing.  Pt then proceeded to stand with good technique and is able to ambulate to therapy gym with good gait mechanics.  Pt has decreased stance time on affected leg initially, however increases that stance time as therapy progresses.  Pt educated on stair training and is able to perform with good technique and good recall.  Pt then proceeded to ambulate back to room where she transferred back to the bed.  Pt has good technique with all transfers and only requires CGA/supervision.  Pt performed bed-level exercises with good technique as well.  Nursing present in room following completion of session.  Pt will benefit from skilled PT intervention to increase independence and safety with basic mobility in preparation for discharge to the venue listed below.       Follow Up Recommendations Home health PT    Equipment Recommendations  Rolling walker with 5" wheels    Recommendations for Other Services       Precautions / Restrictions Precautions Precautions: Fall;Knee Precaution Booklet Issued: Yes (comment) Precaution Comments: indep with SLR, no KI required Restrictions Weight Bearing Restrictions: Yes LLE Weight Bearing: Weight bearing as tolerated      Mobility  Bed Mobility Overal bed mobility: Modified Independent                  Transfers Overall transfer level: Needs assistance Equipment used: Rolling walker (2 wheeled) Transfers: Sit to/from Stand Sit to Stand: Min  guard;Supervision         General transfer comment: good safe technique  Ambulation/Gait Ambulation/Gait assistance: Min guard Gait Distance (Feet): 200 Feet Assistive device: Rolling walker (2 wheeled) Gait Pattern/deviations: Step-through pattern;Decreased step length - right;Decreased stance time - left;Decreased stride length Gait velocity: decreased   General Gait Details: Pt with good technique, however does have some decreased stance time on the surgical LE.  Stairs Stairs: Yes Stairs assistance: Min guard Stair Management: One rail Left Number of Stairs: 4 General stair comments: Pt performs wih good technique and good recall.  Wheelchair Mobility    Modified Rankin (Stroke Patients Only)       Balance Overall balance assessment: Needs assistance Sitting-balance support: No upper extremity supported;Feet supported Sitting balance-Leahy Scale: Normal     Standing balance support: Bilateral upper extremity supported Standing balance-Leahy Scale: Fair Standing balance comment: Pt ha one instance of instability when in standing, however is performing well with mobility with use of the RW.  Pt was able to self-correct the instance of instability.                             Pertinent Vitals/Pain Pain Assessment: 0-10 Pain Score: 3  Pain Location: Anterior Knee Pain Descriptors / Indicators: Aching Pain Intervention(s): Limited activity within patient's tolerance;Monitored during session;Premedicated before session;Repositioned;Ice applied    Home Living Family/patient expects to be discharged to:: Private residence Living Arrangements: Alone Available Help at Discharge: Family;Available 24 hours/day (daughter planning to stay with her initially) Type  of Home: House Home Access: Stairs to enter Entrance Stairs-Rails: Left Entrance Stairs-Number of Steps: 2+1 with L rail from back entrance (primary) Home Layout: One level Home Equipment: Shower  seat;Grab bars - toilet;Grab bars - tub/shower;Walker - 2 wheels;Cane - single point      Prior Function Level of Independence: Independent with assistive device(s)         Comments: Pt only recently requiring SPC for balance 2/2 L knee pain, indep with mobility prior to that, indep with ADL, IADL, including lawn care for her home     Hand Dominance   Dominant Hand: Left    Extremity/Trunk Assessment   Upper Extremity Assessment Upper Extremity Assessment: Overall WFL for tasks assessed    Lower Extremity Assessment Lower Extremity Assessment: LLE deficits/detail LLE Deficits / Details: s/p TKE expected post-op strength/ROM deficits LLE Sensation: WNL       Communication   Communication: No difficulties  Cognition Arousal/Alertness: Awake/alert Behavior During Therapy: WFL for tasks assessed/performed Overall Cognitive Status: Within Functional Limits for tasks assessed                                        General Comments      Exercises Total Joint Exercises Ankle Circles/Pumps: AROM;Strengthening;Both;10 reps;Supine Quad Sets: AROM;Strengthening;Both;10 reps;Supine Gluteal Sets: AROM;Strengthening;Both;10 reps;Supine Heel Slides: AROM;Strengthening;Both;10 reps;Supine Hip ABduction/ADduction: AROM;Strengthening;Both;10 reps;Supine Straight Leg Raises: AROM;Strengthening;Both;10 reps;Supine Long Arc Quad: AROM;Strengthening;Both;10 reps;Supine Marching in Standing: AROM;Strengthening;Both;10 reps;Standing Other Exercises Other Exercises: Pt instructed in AE/DME, falls prevention, home/routines modifications, compression stocking mgt, polar care mgt; handout provided to support recall and carryover Other Exercises: Pt edcuated on role of PT and the services provided during hospital stay.  Pt also educated on importance of exercises in order to increase strength.   Assessment/Plan    PT Assessment Patient needs continued PT services  PT  Problem List Decreased strength;Decreased range of motion;Decreased activity tolerance;Decreased mobility;Pain       PT Treatment Interventions DME instruction;Gait training;Stair training;Functional mobility training;Therapeutic activities;Therapeutic exercise;Balance training;Neuromuscular re-education    PT Goals (Current goals can be found in the Care Plan section)  Acute Rehab PT Goals Patient Stated Goal: to go home with daughter PT Goal Formulation: With patient Time For Goal Achievement: 06/08/21 Potential to Achieve Goals: Good    Frequency BID   Barriers to discharge        Co-evaluation               AM-PAC PT "6 Clicks" Mobility  Outcome Measure Help needed turning from your back to your side while in a flat bed without using bedrails?: None Help needed moving from lying on your back to sitting on the side of a flat bed without using bedrails?: None Help needed moving to and from a bed to a chair (including a wheelchair)?: None Help needed standing up from a chair using your arms (e.g., wheelchair or bedside chair)?: None Help needed to walk in hospital room?: A Little Help needed climbing 3-5 steps with a railing? : A Little 6 Click Score: 22    End of Session Equipment Utilized During Treatment: Gait belt Activity Tolerance: Patient tolerated treatment well Patient left: in bed;with call bell/phone within reach;with bed alarm set Nurse Communication: Mobility status PT Visit Diagnosis: Other abnormalities of gait and mobility (R26.89);Muscle weakness (generalized) (M62.81)    Time: 8891-6945 PT Time Calculation (min) (ACUTE ONLY): 34 min  Charges:   PT Evaluation $PT Eval Low Complexity: 1 Low PT Treatments $Gait Training: 8-22 mins $Therapeutic Exercise: 8-22 mins        Gwenlyn Saran, PT, DPT 05/25/21, 11:44 AM   Christie Nottingham 05/25/2021, 11:44 AM

## 2021-05-25 NOTE — Discharge Summary (Signed)
Physician Discharge Summary  Patient ID: Debra Kelly MRN: [id number] DOB/AGE: Dec 13, 1940 80 y.o.  Admit date: 05/24/2021 Discharge date: 05/26/2021  Admission Diagnoses:  Total knee replacement status [Z96.659]  Surgeries:Procedure(s): Left total knee arthroplasty using computer-assisted navigation   SURGEON:  Marciano Sequin. M.D.   ASSISTANT: Benjaman Lobe, RNFA, (present and scrubbed throughout the case, critical for assistance with exposure, retraction, instrumentation, and closure)   ANESTHESIA: spinal   ESTIMATED BLOOD LOSS: 50 mL   FLUIDS REPLACED: 1200 mL of crystalloid   TOURNIQUET TIME: 95 minutes   DRAINS: 2 medium Hemovac drains   SOFT TISSUE RELEASES: Anterior cruciate ligament, posterior cruciate ligament, deep medial collateral ligament, patellofemoral ligament, and posterolateral corner   IMPLANTS UTILIZED: DePuy Attune size 5 posterior stabilized femoral component (cemented), size 5 rotating platform tibial component (cemented), 35 mm medialized dome patella (cemented), and a 5 mm stabilized rotating platform polyethylene insert.  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[place]) 12/15/2015   Dermatitis 11/17/2015   Acquired hypothyroidism 11/11/2015   Essential hypertension 11/11/2015   Breast cancer of upper-outer quadrant of right female breast ([place]) 04/20/2015    Past Medical History:  Diagnosis Date   Allergy    Anxiety    Arthritis  09-13-12   arthritis- knees, right shoulder, finger   Asthma    h/o   Breast cancer ([place]) 04/16/15   right breastinvasive and in situ mammary    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 supplement   Personal history of radiation therapy    Radiation 07/01/15-08/04/15   right breast     Transfusion:    Consultants (if any):   Discharged Condition: Improved  Hospital Course: Debra Kelly is an 80 y.o. female who was admitted 05/24/2021 with a diagnosis of left knee osteoarthritis and went to the operating room on 05/24/2021 and underwent left total knee arthoplasty. The patient received perioperative antibiotics for prophylaxis (see below). The patient tolerated the procedure well and was transported to PACU in stable condition. After meeting PACU criteria, the patient was subsequently transferred to the Orthopaedics/Rehabilitation unit.   The patient received DVT prophylaxis in the form of early mobilization, Lovenox, Foot Pumps, and TED hose. A sacral pad had been placed and heels were elevated off of the bed with rolled towels in order to protect skin integrity. Foley catheter was discontinued on postoperative day #0. Wound drains were discontinued on postoperative day #2. The surgical incision was healing well without signs of infection.  Physical therapy was initiated postoperatively for transfers, gait training, and strengthening. Occupational therapy was initiated for activities of daily living and evaluation for assisted devices. Rehabilitation goals were reviewed in detail with the patient. The patient made steady progress with physical therapy and physical therapy recommended discharge to Home.   The patient achieved the preliminary goals of this  hospitalization and was felt to be medically and orthopaedically appropriate for  discharge.  She was given perioperative antibiotics:  Anti-infectives (From admission, onward)    Start     Dose/Rate Route Frequency Ordered Stop   05/24/21 1800  ceFAZolin (ANCEF) IVPB 2g/100 mL premix        2 g 200 mL/hr over 30 Minutes Intravenous Every 6 hours 05/24/21 1702 05/25/21 0017   05/24/21 1010  ceFAZolin (ANCEF) 2-4 GM/100ML-% IVPB       Note to Pharmacy: Debra Kelly   : cabinet override      05/24/21 1010 05/24/21 1213   05/24/21 0600  ceFAZolin (ANCEF) IVPB 2g/100 mL premix        2 g 200 mL/hr over 30 Minutes Intravenous On call to O.R. 05/23/21 2245 05/24/21 1222     .  Recent vital signs:  Vitals:   05/26/21 0820 05/26/21 1117  BP: (!) 120/59 (!) 124/56  Pulse: (!) 108 (!) 110  Resp: 16 16  Temp: 98.4 F (36.9 C) 99 F (37.2 C)  SpO2: 100% 97%    Recent laboratory studies:  No results for input(s): WBC, HGB, HCT, PLT, K, CL, CO2, BUN, CREATININE, GLUCOSE, CALCIUM, LABPT, INR in the last 72 hours.  Diagnostic Studies: DG Knee Left Port  Result Date: 05/24/2021 CLINICAL DATA:  Status post total knee replacement EXAM: PORTABLE LEFT KNEE - 1-2 VIEW COMPARISON:  None. FINDINGS: Frontal and lateral views obtained. Patient is status post total knee replacement with prosthetic components well-seated. No fracture or dislocation. No erosive change. Skin staples noted anteriorly. Drain in knee joint. IMPRESSION: Status post total knee replacement with prosthetic components well-seated. No acute fracture or dislocation. Postoperative changes noted. Electronically Signed   By: Lowella Grip III M.D.   On: 05/24/2021 15:57   MM 3D SCREEN BREAST BILATERAL  Result Date: 05/20/2021 CLINICAL DATA:  Screening. EXAM: DIGITAL SCREENING BILATERAL MAMMOGRAM WITH TOMOSYNTHESIS AND CAD TECHNIQUE: Bilateral screening digital craniocaudal and mediolateral oblique mammograms were obtained. Bilateral screening digital breast tomosynthesis was performed. The images were evaluated  with computer-aided detection. COMPARISON:  Previous exam(s). ACR Breast Density Category c: The breast tissue is heterogeneously dense, which may obscure small masses. FINDINGS: There are no findings suspicious for malignancy. The images were evaluated with computer-aided detection. IMPRESSION: No mammographic evidence of malignancy. A result letter of this screening mammogram will be mailed directly to the patient. RECOMMENDATION: Screening mammogram in one year. (Code:[id number]-B-01Y) BI-RADS CATEGORY  1: Negative. Electronically Signed   By: Claudie Revering M.D.   On: 05/20/2021 17:42    Discharge Medications:   Allergies as of 05/26/2021       Reactions   Shellfish Allergy Shortness Of Breath   Lisinopril Cough   Radiaplexrx [skin Protectants, Misc.] Other (See Comments)   Contraindicated with allergy of barbiturates causing rash   Adhesive [tape] Rash   Paper tape ok   Amoxicillin-pot Clavulanate Diarrhea   Barbiturates Rash   Celecoxib Hives, Other (See Comments)   "made me feel funny"   Codeine Rash   Loratadine Other (See Comments)   Feels weird Other reaction(s): Other (See Comments)   Tapentadol Rash        Medication List     TAKE these medications    acetaminophen 500 MG tablet Commonly known as: TYLENOL Take 500 mg by mouth daily.   amoxicillin 500 MG capsule Commonly known as: AMOXIL Take 2,000 mg by mouth once.   anastrozole 1 MG tablet Commonly known as:  ARIMIDEX Take 1 tablet (1 mg total) by mouth every morning.   Calcium 600-400 MG-UNIT Chew Chew 1 tablet by mouth daily.   carboxymethylcellulose 0.5 % Soln Commonly known as: REFRESH PLUS Place 1 drop into both eyes 3 (three) times daily as needed (dry eyes).   cetirizine 10 MG tablet Commonly known as: ZYRTEC Take 10 mg by mouth as needed for allergies.   diphenhydrAMINE HCl (Sleep) 50 MG tablet Take 50 mg by mouth at bedtime as needed for sleep.   Unisom Sleepgels 50 MG Caps Generic drug:  diphenhydrAMINE HCl (Sleep) Take by mouth.   docusate sodium 100 MG capsule Commonly known as: COLACE Take 100 mg by mouth as needed for mild constipation.   enoxaparin 40 MG/0.4ML injection Commonly known as: LOVENOX Inject 0.4 mLs (40 mg total) into the skin daily for 14 days.   fluticasone 50 MCG/ACT nasal spray Commonly known as: FLONASE Place 1 spray into both nostrils as needed for allergies or rhinitis.   hydrochlorothiazide 25 MG tablet Commonly known as: HYDRODIURIL Take 25 mg by mouth every morning.   levothyroxine 175 MCG tablet Commonly known as: SYNTHROID Take 175 mcg by mouth daily before breakfast.   loperamide 2 MG capsule Commonly known as: IMODIUM Take 4 mg by mouth daily as needed for diarrhea or loose stools.   losartan 25 MG tablet Commonly known as: COZAAR Take 50 mg by mouth every morning.   Magnesium 500 MG Caps Take 500 mg by mouth every morning.   meloxicam 7.5 MG tablet Commonly known as: MOBIC Take 1 tablet (7.5 mg total) by mouth daily.   MULTIVITAMIN ADULT PO Take 1 tablet by mouth at bedtime.   oxybutynin 5 MG tablet Commonly known as: DITROPAN Take 5 mg by mouth every morning.   oxyCODONE 5 MG immediate release tablet Commonly known as: Oxy IR/ROXICODONE Take 1 tablet (5 mg total) by mouth every 4 (four) hours as needed for moderate pain (pain score 4-6).   rosuvastatin 5 MG tablet Commonly known as: CRESTOR Take 5 mg by mouth at bedtime.   traMADol 50 MG tablet Commonly known as: ULTRAM Take 1-2 tablets (50-100 mg total) by mouth every 6 (six) hours as needed for moderate pain.   UNABLE TO FIND Take 1 Scoop by mouth daily. Collagen complex   vitamin B-12 500 MCG tablet Commonly known as: CYANOCOBALAMIN Take 500 mcg by mouth daily.       ASK your doctor about these medications    ALPRAZolam 0.5 MG tablet Commonly known as: XANAX Take 0.5 tablets (0.25 mg total) by mouth 2 (two) times daily as needed for anxiety.    Potassium 99 MG Tabs Take 1 tablet (99 mg total) by mouth 2 (two) times daily.               Durable Medical Equipment  (From admission, onward)           Start     Ordered   05/24/21 1703  DME Walker rolling  Once       Question:  Patient needs a walker to treat with the following condition  Answer:  Total knee replacement status   05/24/21 1702   05/24/21 1703  DME Bedside commode  Once       Question:  Patient needs a bedside commode to treat with the following condition  Answer:  Total knee replacement status   05/24/21 1702            Disposition: Home with  home health PT     Follow-up Information     [facility]ir On 06/08/2021.   Specialty: Orthopedic Surgery Why: at 9:45am Contact information: [place] [address] [phone]         Dereck Leep, MD On 07/06/2021.   Specialty: Orthopedic Surgery Why: at 9:45am Contact information: [place] [address] [place], PA-C 05/26/2021, 1:58 PM

## 2021-05-25 NOTE — Progress Notes (Signed)
ORTHOPAEDICS PROGRESS NOTE  PATIENT NAME: Debra Kelly DOB: 28-Oct-1941  MRN: [id number]  POD # 1: Left total knee arthroplasty  Subjective: The patient rested well last night.  Pain has been under good control.  She denies any nausea or vomiting.  Objective: Vital signs in last 24 hours: Temp:  [98.1 F (36.7 C)-99.3 F (37.4 C)] 98.6 F (37 C) (06/14 0728) Pulse Rate:  [77-110] 87 (06/14 0728) Resp:  [15-21] 15 (06/14 0728) BP: (88-149)/(44-78) 121/72 (06/14 0728) SpO2:  [93 %-100 %] 98 % (06/14 0728) Weight:  [75.3 kg] 75.3 kg (06/13 1024)  Intake/Output from previous day: 06/13 0701 - 06/14 0700 In: 2160 [P.O.:560; I.V.:1200; IV Piggyback:400] Out: 955 [Urine:670; Drains:235; Blood:50]  No results for input(s): WBC, HGB, HCT, PLT, K, CL, CO2, BUN, CREATININE, GLUCOSE, CALCIUM, LABPT, INR in the last 72 hours.  EXAM General: Well-developed well-nourished female seen in no apparent discomfort. Lungs: clear to auscultation Cardiac: normal rate and regular rhythm Abdomen: Soft, nontender, nondistended.  Bowel sounds are present. Left lower extremity: Dressing is dry and intact.  Polar Care and Hemovac are in place and functioning.  The patient is able to perform an independent straight leg raise.  Homans test is negative.  Good active ankle dorsiflexion and plantarflexion. Neurologic: Awake, alert, and oriented.  Sensory and motor function are intact.  Assessment: Left total knee arthroplasty  Secondary diagnoses: Anxiety Asthma History of breast cancer Hypertension Hypothyroidism  Plan: Today's goals were reviewed with the patient.  Begin physical therapy and Occupational Therapy as per total knee arthroplasty rehab protocol.  The patient may be weightbearing as tolerated. Plan is to go Home after hospital stay. DVT Prophylaxis - Lovenox, Foot Pumps, and TED hose  Laci Frenkel P. Holley Bouche M.D.

## 2021-05-25 NOTE — Progress Notes (Signed)
Physical Therapy Treatment Patient Details Name: Debra Kelly MRN: [id number] DOB: Jan 08, 1941 Today's Date: 05/25/2021    History of Present Illness 80 yo female s/p L TKA on 05/24/21. 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    PT Comments    Assisted off commode - void only, then 160' of gait with RW.  Assisted back to bed.  Needs met.  Pt stated she anticipated discharge home tomorrow.    Follow Up Recommendations  Home health PT     Equipment Recommendations  Rolling walker with 5" wheels    Recommendations for Other Services       Precautions / Restrictions Precautions Precautions: Fall;Knee Precaution Booklet Issued: Yes (comment) Precaution Comments: indep with SLR, no KI required Restrictions Weight Bearing Restrictions: Yes LLE Weight Bearing: Weight bearing as tolerated    Mobility  Bed Mobility Overal bed mobility: Modified Independent                  Transfers Overall transfer level: Needs assistance Equipment used: Rolling walker (2 wheeled) Transfers: Sit to/from Stand Sit to Stand: Min guard;Supervision         General transfer comment: good safe technique  Ambulation/Gait Ambulation/Gait assistance: Min guard Gait Distance (Feet): 160 Feet Assistive device: Rolling walker (2 wheeled) Gait Pattern/deviations: Step-through pattern;Decreased step length - right;Decreased stance time - left;Decreased stride length Gait velocity: decreased   General Gait Details: Pt with good technique, however does have some decreased stance time on the surgical LE.   Stairs Stairs: Yes Stairs assistance: Min guard Stair Management: One rail Left Number of Stairs: 4 General stair comments: Pt performs wih good technique and good recall.   Wheelchair Mobility    Modified Rankin (Stroke Patients Only)       Balance Overall balance assessment: Needs  assistance Sitting-balance support: No upper extremity supported;Feet supported Sitting balance-Leahy Scale: Normal     Standing balance support: Bilateral upper extremity supported Standing balance-Leahy Scale: Fair Standing balance comment: Pt ha one instance of instability when in standing, however is performing well with mobility with use of the RW.  Pt was able to self-correct the instance of instability.                            Cognition Arousal/Alertness: Awake/alert Behavior During Therapy: WFL for tasks assessed/performed Overall Cognitive Status: Within Functional Limits for tasks assessed                                        Exercises Total Joint Exercises Ankle Circles/Pumps: AROM;Strengthening;Both;10 reps;Supine Quad Sets: AROM;Strengthening;Both;10 reps;Supine Gluteal Sets: AROM;Strengthening;Both;10 reps;Supine Heel Slides: AROM;Strengthening;Both;10 reps;Supine Hip ABduction/ADduction: AROM;Strengthening;Both;10 reps;Supine Straight Leg Raises: AROM;Strengthening;Both;10 reps;Supine Long Arc Quad: AROM;Strengthening;Both;10 reps;Supine Marching in Standing: AROM;Strengthening;Both;10 reps;Standing Other Exercises Other Exercises: Pt instructed in AE/DME, falls prevention, home/routines modifications, compression stocking mgt, polar care mgt; handout provided to support recall and carryover Other Exercises: Pt edcuated on role of PT and the services provided during hospital stay.  Pt also educated on importance of exercises in order to increase strength.    General Comments        Pertinent Vitals/Pain Pain Assessment: 0-10 Pain Score: 2  Pain Location: Anterior Knee Pain Descriptors / Indicators: Sore Pain Intervention(s): Limited activity within patient's tolerance;Monitored during session;Repositioned;Ice applied  Home Living Family/patient expects to be discharged to:: Private residence Living Arrangements:  Alone Available Help at Discharge: Family;Available 24 hours/day (daughter planning to stay with her initially) Type of Home: House Home Access: Stairs to enter Entrance Stairs-Rails: Left Home Layout: One level Home Equipment: Shower seat;Grab bars - toilet;Grab bars - tub/shower;Walker - 2 wheels;Cane - single point      Prior Function Level of Independence: Independent with assistive device(s)      Comments: Pt only recently requiring SPC for balance 2/2 L knee pain, indep with mobility prior to that, indep with ADL, IADL, including lawn care for her home   PT Goals (current goals can now be found in the care plan section) Acute Rehab PT Goals Patient Stated Goal: to go home with daughter PT Goal Formulation: With patient Time For Goal Achievement: 06/08/21 Potential to Achieve Goals: Good Progress towards PT goals: Progressing toward goals    Frequency    BID      PT Plan      Co-evaluation              AM-PAC PT "6 Clicks" Mobility   Outcome Measure  Help needed turning from your back to your side while in a flat bed without using bedrails?: None Help needed moving from lying on your back to sitting on the side of a flat bed without using bedrails?: None Help needed moving to and from a bed to a chair (including a wheelchair)?: None Help needed standing up from a chair using your arms (e.g., wheelchair or bedside chair)?: None Help needed to walk in hospital room?: A Little Help needed climbing 3-5 steps with a railing? : A Little 6 Click Score: 22    End of Session Equipment Utilized During Treatment: Gait belt Activity Tolerance: Patient tolerated treatment well Patient left: in bed;with call bell/phone within reach;with bed alarm set Nurse Communication: Mobility status PT Visit Diagnosis: Other abnormalities of gait and mobility (R26.89);Muscle weakness (generalized) (M62.81)     Time: 6381-7711 PT Time Calculation (min) (ACUTE ONLY): 8  min  Charges:  $Gait Training: 8-22 mins $Therapeutic Exercise: 8-22 mins                    Chesley Noon, PTA 05/25/21, 1:31 PM 05/25/2021, 1:31 PM

## 2021-05-25 NOTE — Anesthesia Postprocedure Evaluation (Signed)
Anesthesia Post Note  Patient: Debra Kelly  Procedure(s) Performed: COMPUTER ASSISTED TOTAL KNEE ARTHROPLASTY (Left: Knee)  Patient location during evaluation: Nursing Unit Anesthesia Type: Spinal Level of consciousness: oriented and awake and alert Pain management: pain level controlled Vital Signs Assessment: post-procedure vital signs reviewed and stable Respiratory status: spontaneous breathing and respiratory function stable Cardiovascular status: blood pressure returned to baseline and stable Postop Assessment: no headache, no backache, no apparent nausea or vomiting and patient able to bend at knees Anesthetic complications: no   No notable events documented.   Last Vitals:  Vitals:   05/25/21 0542 05/25/21 0728  BP: (!) 128/59 121/72  Pulse: 77 87  Resp: 18 15  Temp: 36.9 C 37 C  SpO2: 96% 98%    Last Pain:  Vitals:   05/25/21 0728  TempSrc: Oral  PainSc:                  Alison Stalling

## 2021-05-26 MED ORDER — ENOXAPARIN SODIUM 40 MG/0.4ML IJ SOSY: 40.0000 mg | PREFILLED_SYRINGE | INTRAMUSCULAR | 0 refills | Status: DC

## 2021-05-26 MED ORDER — MELOXICAM 7.5 MG PO TABS: 7.5000 mg | ORAL_TABLET | Freq: Every day | ORAL | 0 refills | Status: AC

## 2021-05-26 MED ORDER — OXYCODONE HCL 5 MG PO TABS: 5.0000 mg | ORAL_TABLET | ORAL | 0 refills | Status: DC | PRN

## 2021-05-26 NOTE — TOC Progression Note (Signed)
Transition of Care [facility]) - Progression Note    Patient Details  Name: Debra Kelly MRN: [id number] Date of Birth: 1941-01-04  Transition of Care [facility]) CM/SW [place], RN Phone Number: 05/26/2021, 10:40 AM  Clinical Narrative:   Patient lives at home alone, daughter will stay with her for a few days, and then friends will stay to assist.  Patient has no concerns about returning home.  Patient has walker and cane, but would like to be set up with 3 n 1, sent to [employer] to be delivered.  Patient was already set up with [facility] home health as per MD office, confirmed by [place].  No further concerns for TOC.  TOC contact information given to patient    Expected Discharge Plan: [place]    Expected Discharge Plan and Services Expected Discharge Plan: [place]   Discharge Planning Services: CM Consult Post Acute Care Choice: Durable Medical Equipment, Home Health Living arrangements for the past 2 months: Single Family Home                 DME Arranged: 3-N-1 DME Agency: [employer] Date DME Agency Contacted: 05/26/21 Time DME Agency Contacted: 4 Representative spoke with at DME Agency: [facility] HH Arranged: PT, OT HH Agency: [place] Date [place]: 05/26/21 Time [place]: 35 Representative spoke with at [place]: Set up by MD office, confirmed today with [place]   Social Determinants of Health (SDOH) Interventions    Readmission Risk Interventions No flowsheet data found.

## 2021-05-26 NOTE — Progress Notes (Signed)
  Subjective: 2 Days Post-Op Procedure(s) (LRB): COMPUTER ASSISTED TOTAL KNEE ARTHROPLASTY (Left) Patient reports pain as well-controlled.   Patient is well, and has had no acute complaints or problems Plan is to go Home after hospital stay. Negative for chest pain and shortness of breath Fever: no Gastrointestinal: negative for nausea and vomiting.   Patient has not had a bowel movement.  Objective: Vital signs in last 24 hours: Temp:  [97.8 F (36.6 C)-99 F (37.2 C)] 99 F (37.2 C) (06/15 1117) Pulse Rate:  [85-110] 110 (06/15 1117) Resp:  [16-20] 16 (06/15 1117) BP: (113-144)/(55-70) 124/56 (06/15 1117) SpO2:  [95 %-100 %] 97 % (06/15 1117)  Intake/Output from previous day:  Intake/Output Summary (Last 24 hours) at 05/26/2021 1348 Last data filed at 05/26/2021 1015 Gross per 24 hour  Intake 640 ml  Output 150 ml  Net 490 ml    Intake/Output this shift: Total I/O In: 640 [P.O.:640] Out: -   Labs: No results for input(s): HGB in the last 72 hours. No results for input(s): WBC, RBC, HCT, PLT in the last 72 hours. No results for input(s): NA, K, CL, CO2, BUN, CREATININE, GLUCOSE, CALCIUM in the last 72 hours. No results for input(s): LABPT, INR in the last 72 hours.   EXAM General - Patient is Alert, Appropriate, and Oriented Extremity - Neurovascular intact Dorsiflexion/Plantar flexion intact Compartment soft Dressing/Incision -Postoperative dressing remains in place., Polar Care in place and working. , Hemovac in place. , Following removal of post-op dressing, minimal blood noted on honeycomb Motor Function - intact, moving foot and toes well on exam.    Cardiovascular-  tachycardic rate, regular rhythm Respiratory- Lungs clear to auscultation bilaterally Gastrointestinal- soft, nontender, and active bowel sounds   Assessment/Plan: 2 Days Post-Op Procedure(s) (LRB): COMPUTER ASSISTED TOTAL KNEE ARTHROPLASTY (Left) Active Problems:   Primary osteoarthritis of  left knee  Estimated body mass index is 28.49 kg/m as calculated from the following:   Height as of this encounter: 5\' 4"  (1.626 m).   Weight as of this encounter: 75.3 kg. Advance diet Up with therapy Discharge home with home health pending BM    Post-op dressing removed. , Hemovac removed., and Mini compression dressing applied.   DVT Prophylaxis - Lovenox, Ted hose, and foot pumps Weight-Bearing as tolerated to left leg  Cassell Smiles, PA-C [facility] 05/26/2021, 1:48 PM

## 2021-05-26 NOTE — Progress Notes (Signed)
Physical Therapy Treatment Patient Details Name: Debra Kelly MRN: [id number] DOB: [birth year] Today's Date: 05/26/2021    History of Present Illness 80 yo female s/p L TKA on 05/24/21. 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    PT Comments    Excellent tolerance for activity with little to no physical assist. Pt utilized RW for gait 157ft with Supervision, Negotiated 3 steps with single rail, good recall of sequence and technique. Pt appears safe functionally to return home with initial help from her daughter and HHPT services/   Follow Up Recommendations  Home health PT     Equipment Recommendations  Rolling walker with 5" wheels    Recommendations for Other Services       Precautions / Restrictions Precautions Precautions: Fall;Knee Precaution Booklet Issued: Yes (comment) Precaution Comments: indep with SLR, no KI required Restrictions Weight Bearing Restrictions: Yes LLE Weight Bearing: Weight bearing as tolerated    Mobility  Bed Mobility                    Transfers Overall transfer level: Needs assistance Equipment used: Rolling walker (2 wheeled) Transfers: Sit to/from Stand Sit to Stand: Supervision         General transfer comment: good safe technique  Ambulation/Gait Ambulation/Gait assistance: Min guard Gait Distance (Feet): 120 Feet Assistive device: Rolling walker (2 wheeled) Gait Pattern/deviations: Step-through pattern;Decreased step length - right;Decreased stance time - left;Decreased stride length Gait velocity: decreased   General Gait Details: Pt with good technique, however does have some decreased stance time on the surgical LE.   Stairs Stairs: Yes Stairs assistance: Min guard Stair Management: One rail Left Number of Stairs: 3 General stair comments: Pt performs wih good technique and good recall.   Wheelchair Mobility     Modified Rankin (Stroke Patients Only)       Balance                                            Cognition Arousal/Alertness: Awake/alert Behavior During Therapy: WFL for tasks assessed/performed Overall Cognitive Status: Within Functional Limits for tasks assessed                                        Exercises      General Comments        Pertinent Vitals/Pain Pain Assessment: 0-10 Pain Score: 3  Pain Location: Anterior Knee Pain Descriptors / Indicators: Sore Pain Intervention(s): Monitored during session    Home Living                      Prior Function            PT Goals (current goals can now be found in the care plan section) Acute Rehab PT Goals Patient Stated Goal: to go home with daughter    Frequency    BID      PT Plan Current plan remains appropriate    Co-evaluation              AM-PAC PT "6 Clicks" Mobility   Outcome Measure  Help needed turning from your back to your side while in a flat bed without using bedrails?: None Help  needed moving from lying on your back to sitting on the side of a flat bed without using bedrails?: None Help needed moving to and from a bed to a chair (including a wheelchair)?: None Help needed standing up from a chair using your arms (e.g., wheelchair or bedside chair)?: None Help needed to walk in hospital room?: A Little Help needed climbing 3-5 steps with a railing? : A Little 6 Click Score: 22    End of Session Equipment Utilized During Treatment: Gait belt Activity Tolerance: Patient tolerated treatment well Patient left: in chair;with call bell/phone within reach;with chair alarm set Nurse Communication: Mobility status PT Visit Diagnosis: Other abnormalities of gait and mobility (R26.89);Muscle weakness (generalized) (M62.81)     Time: 1000-1040 PT Time Calculation (min) (ACUTE ONLY): 40 min  Charges:  $Gait Training: 23-37  mins $Therapeutic Activity: 8-22 mins                     Mikel Cella, PTA    Debra Kelly 05/26/2021, 1:27 PM

## 2021-05-26 NOTE — Progress Notes (Signed)
Pt d/c home via [place] with daughter.  D/c paperwork was reviewed with pt and daughter at bedside they both expressed understanding.  Thigh high TEDs hose in place to both legs.  Polar care, bone foam, IS and belongings packed up and taken with pt at time of d/c.  NAD noted, VSS.  IV removed from pts R hand without issue.  Pt was wheeled to med mall ent and assisted into car by NT.

## 2021-08-04 ENCOUNTER — Other Ambulatory Visit: Payer: Self-pay | Admitting: Hematology and Oncology

## 2021-08-04 DIAGNOSIS — C50411 Malignant neoplasm of upper-outer quadrant of right female breast: Secondary | ICD-10-CM

## 2021-11-26 ENCOUNTER — Other Ambulatory Visit: Payer: Self-pay | Admitting: Hematology and Oncology

## 2021-11-26 DIAGNOSIS — C50411 Malignant neoplasm of upper-outer quadrant of right female breast: Secondary | ICD-10-CM

## 2021-11-29 NOTE — Progress Notes (Signed)
Patient Care Team: Rusty Aus, MD as PCP - General (Internal Medicine) Alphonsa Overall, MD as Consulting Physician (General Surgery) Nicholas Lose, MD as Consulting Physician (Hematology and Oncology) Thea Silversmith, MD as Consulting Physician (Radiation Oncology) Mauro Kaufmann, RN as Registered Nurse Rockwell Germany, RN as Registered Nurse Holley Bouche, NP (Inactive) as Nurse Practitioner (Nurse Practitioner) Sylvan Cheese, NP as Nurse Practitioner (Hematology and Oncology)  DIAGNOSIS:    ICD-10-CM   1. Malignant neoplasm of upper-outer quadrant of right breast in female, estrogen receptor positive ([place])  C50.411    Z17.0       SUMMARY OF ONCOLOGIC HISTORY: Oncology History  Breast cancer of upper-outer quadrant of right female breast ([place])  04/03/2015 Mammogram   LEFT breast: circumscribed oval mass in the outer quadrant measuring ~12 mm. This is posterior to a second oval mass that is unchanged from prior exams. 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 No chemotherapy Lindi Adie).   05/08/2015 Pathologic Stage   Stage IA: pT1c pN0   07/01/2015 - 08/04/2015 Radiation Therapy   Adjuvant RT Pablo Ledger): Right breast 50 Gy over 25 fractions   08/17/2015 -  Anti-estrogen oral therapy   Anastrozole 1 mg daily (Cleota Pellerito).  Planned duration of therapy 5 years.   09/24/2015 Survivorship   Survivorship visit completed and copy of care plan provided to patient.     CHIEF COMPLIANT: Follow-up pf right breast cancer on anastrozole therapy   INTERVAL HISTORY: Debra Kelly is a 80 y.o. with above-mentioned history of right breast cancer treated with lumpectomy, adjuvant radiation, and who is currently on anastrozole. Mammogram on 05/19/2021 showed no evidence of malignancy bilaterally. She presents to the clinic today for follow-up.  Her major complaint is diffuse joint aches and pains especially in her hands.  ALLERGIES:  is allergic to shellfish allergy; lisinopril; radiaplexrx [skin protectants, misc.]; adhesive [tape]; amoxicillin-pot clavulanate; barbiturates; celecoxib; codeine; loratadine; and tapentadol.  MEDICATIONS:  Current Outpatient Medications  Medication Sig Dispense Refill   acetaminophen (TYLENOL) 500 MG tablet Take 500 mg by mouth daily.     ALPRAZolam (XANAX) 0.5 MG tablet Take 0.5 tablets (0.25 mg total) by mouth 2 (two) times daily as needed for anxiety. (Patient taking differently: Take 0.5 mg by mouth as needed.) 60 tablet 0   amoxicillin (AMOXIL) 500 MG capsule Take 2,000 mg by mouth once.     anastrozole (ARIMIDEX) 1 MG tablet TAKE 1 TABLET BY MOUTH EVERY DAY IN THE MORNING 90 tablet 0   Calcium 600-400 MG-UNIT CHEW Chew 1 tablet by mouth daily.     carboxymethylcellulose (REFRESH PLUS) 0.5 % SOLN Place 1 drop into both eyes 3 (three) times daily as needed (dry eyes).     cetirizine (ZYRTEC) 10 MG tablet Take 10 mg by mouth as needed for allergies.  diphenhydrAMINE HCl, Sleep, (UNISOM SLEEPGELS) 50 MG CAPS Take by mouth.     diphenhydrAMINE HCl, Sleep, 50 MG tablet Take 50 mg by mouth at bedtime as needed for sleep.     docusate sodium (COLACE) 100 MG capsule Take 100 mg by mouth as needed for mild constipation.     enoxaparin (LOVENOX) 40 MG/0.4ML injection Inject 0.4 mLs (40 mg total)  into the skin daily for 14 days. 5.6 mL 0   fluticasone (FLONASE) 50 MCG/ACT nasal spray Place 1 spray into both nostrils as needed for allergies or rhinitis.     hydrochlorothiazide (HYDRODIURIL) 25 MG tablet Take 25 mg by mouth every morning.  11   levothyroxine (SYNTHROID, LEVOTHROID) 175 MCG tablet Take 175 mcg by mouth daily before breakfast.     loperamide (IMODIUM) 2 MG capsule Take 4 mg by mouth daily as needed for diarrhea or loose stools.     losartan (COZAAR) 25 MG tablet Take 50 mg by mouth every morning.      Magnesium 500 MG CAPS Take 500 mg by mouth every morning.      meloxicam (MOBIC) 7.5 MG tablet Take 1 tablet (7.5 mg total) by mouth daily. 45 tablet 0   Multiple Vitamins-Minerals (MULTIVITAMIN ADULT PO) Take 1 tablet by mouth at bedtime.     oxybutynin (DITROPAN) 5 MG tablet Take 5 mg by mouth every morning.      oxyCODONE (OXY IR/ROXICODONE) 5 MG immediate release tablet Take 1 tablet (5 mg total) by mouth every 4 (four) hours as needed for moderate pain (pain score 4-6). 30 tablet 0   Potassium 99 MG TABS Take 1 tablet (99 mg total) by mouth 2 (two) times daily. (Patient taking differently: Take 99 mg by mouth at bedtime.) 330 each 0   rosuvastatin (CRESTOR) 5 MG tablet Take 5 mg by mouth at bedtime.     traMADol (ULTRAM) 50 MG tablet Take 1-2 tablets (50-100 mg total) by mouth every 6 (six) hours as needed for moderate pain. 60 tablet 0   UNABLE TO FIND Take 1 Scoop by mouth daily. Collagen complex     vitamin B-12 (CYANOCOBALAMIN) 500 MCG tablet Take 500 mcg by mouth daily.     No current facility-administered medications for this visit.    PHYSICAL EXAMINATION: ECOG PERFORMANCE STATUS: 1 - Symptomatic but completely ambulatory  Vitals:   11/30/21 0906  BP: (!) 134/49  Pulse: 96  Resp: 18  Temp: 97.9 F (36.6 C)  SpO2: 98%   Filed Weights   11/30/21 0906  Weight: 165 lb 3 oz (74.9 kg)    BREAST: No palpable masses or nodules in either right or left  breasts. No palpable axillary supraclavicular or infraclavicular adenopathy no breast tenderness or nipple discharge. (exam performed in the presence of a chaperone)  LABORATORY DATA:  I have reviewed the data as listed CMP Latest Ref Rng & Units 05/13/2021 08/16/2019 09/14/2017  Glucose 70 - 99 mg/dL 108(H) 107(H) 104(H)  BUN 8 - 23 mg/dL 25(H) 34(H) 23(H)  Creatinine 0.44 - 1.00 mg/dL 0.85 0.91 0.72  Sodium 135 - 145 mmol/L 135 139 140  Potassium 3.5 - 5.1 mmol/L 3.5 3.4(L) 4.0  Chloride 98 - 111 mmol/L 98 101 103  CO2 22 - 32 mmol/L _0 Calcium 8.9 - 10.3 mg/dL 9.2 8.9 9.4  Total Protein 6.5 - 8.1 g/dL 7.2 6.6 -  Total Bilirubin 0.3 - 1.2 mg/dL 0.6 0.5 -  Alkaline Phos 38 - 126 U/L 63  54 -  AST 15 - 41 U/L 20 19 -  ALT 0 - 44 U/L 13 18 -    Lab Results  Component Value Date   WBC 5.7 05/13/2021   HGB 11.8 (L) 05/13/2021   HCT 35.2 (L) 05/13/2021   MCV 91.7 05/13/2021   PLT 248 05/13/2021   NEUTROABS 3.4 09/14/2017    ASSESSMENT & PLAN:  Breast cancer of upper-outer quadrant of right female breast Right breast lumpectomy 05/08/15: 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 Last bone density was done January 2019 at [facility] T score -0.8 normal   Anastrozole toxicities: Occasional hot flashes   Anxiety: On Xanax which is helping her tremendously   Breast cancer surveillance: 1.  Breast exam 11/30/2021: Benign 2. mammogram 05/20/2021: No evidence of malignancy breast density category C   Laminectomy was done in 2018 She had a knee replacement surgery and is doing very  well.  We will send for breast cancer index to determine if she would benefit from extended endocrine therapy.  She will stop antiestrogen therapy as of now and then wait for our call to decide if she needs to go back on it.  If she does need extended adjuvant therapy then she will take it 3 days a week.  Next year she may be moving to [facility] to be closer to her daughter.  RTC in 1 year for follow up    No orders of the defined types were placed in this encounter.  The patient has a good understanding of the overall plan. she agrees with it. she will call with any problems that may develop before the next visit here.  Total time spent: 20 mins including face to face time and time spent for planning, charting and coordination of care  Rulon Eisenmenger, MD, MPH 11/30/2021  I, Thana Ates, am acting as scribe for Dr. Nicholas Lose.  I have reviewed the above documentation for accuracy and completeness, and I agree with the above.

## 2021-11-30 ENCOUNTER — Other Ambulatory Visit: Payer: Self-pay

## 2021-11-30 ENCOUNTER — Inpatient Hospital Stay: Payer: Medicare HMO | Attending: Hematology and Oncology | Admitting: Hematology and Oncology

## 2021-11-30 DIAGNOSIS — M25541 Pain in joints of right hand: Secondary | ICD-10-CM | POA: Diagnosis not present

## 2021-11-30 DIAGNOSIS — M25542 Pain in joints of left hand: Secondary | ICD-10-CM | POA: Insufficient documentation

## 2021-11-30 DIAGNOSIS — F419 Anxiety disorder, unspecified: Secondary | ICD-10-CM | POA: Insufficient documentation

## 2021-11-30 DIAGNOSIS — Z79811 Long term (current) use of aromatase inhibitors: Secondary | ICD-10-CM | POA: Insufficient documentation

## 2021-11-30 DIAGNOSIS — M25642 Stiffness of left hand, not elsewhere classified: Secondary | ICD-10-CM | POA: Insufficient documentation

## 2021-11-30 DIAGNOSIS — C50411 Malignant neoplasm of upper-outer quadrant of right female breast: Secondary | ICD-10-CM | POA: Insufficient documentation

## 2021-11-30 DIAGNOSIS — Z170 Estrogen receptor positive status [ER+]: Secondary | ICD-10-CM | POA: Diagnosis not present

## 2021-11-30 DIAGNOSIS — Z96659 Presence of unspecified artificial knee joint: Secondary | ICD-10-CM | POA: Insufficient documentation

## 2021-11-30 DIAGNOSIS — M25641 Stiffness of right hand, not elsewhere classified: Secondary | ICD-10-CM | POA: Diagnosis not present

## 2021-11-30 NOTE — Assessment & Plan Note (Signed)
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 Last bone density was done January 2019 at [facility] T score -0.8 normal  Anastrozole toxicities:Occasional hot flashes  Anxiety:On Xanax which is helping her tremendously  Breast cancer surveillance: 1.Breast exam 11/30/2021: Benign 2.mammogram 05/20/2021: No evidence of malignancy breast density category C  Laminectomywas done in 2018 She had akneereplacement surgery and is doing very well. Her significant other passed away earlier in the year and her son-in-law passed away last week. RTC in 1 year for follow up

## 2021-12-07 ENCOUNTER — Ambulatory Visit: Payer: Medicare HMO | Admitting: Hematology and Oncology

## 2021-12-22 ENCOUNTER — Encounter: Payer: Self-pay | Admitting: Hematology and Oncology

## 2022-04-07 ENCOUNTER — Other Ambulatory Visit: Payer: Self-pay | Admitting: Hematology and Oncology

## 2022-04-07 DIAGNOSIS — C50411 Malignant neoplasm of upper-outer quadrant of right female breast: Secondary | ICD-10-CM

## 2022-04-18 ENCOUNTER — Other Ambulatory Visit: Payer: Self-pay | Admitting: Hematology and Oncology

## 2022-04-18 DIAGNOSIS — Z1231 Encounter for screening mammogram for malignant neoplasm of breast: Secondary | ICD-10-CM

## 2022-05-20 ENCOUNTER — Ambulatory Visit
Admission: RE | Admit: 2022-05-20 | Discharge: 2022-05-20 | Disposition: A | Discharge status: Home / Self Care | Payer: Medicare HMO | Source: Ambulatory Visit | Attending: Hematology and Oncology | Admitting: Hematology and Oncology

## 2022-05-20 DIAGNOSIS — Z1231 Encounter for screening mammogram for malignant neoplasm of breast: Secondary | ICD-10-CM

## 2022-09-06 ENCOUNTER — Telehealth: Payer: Self-pay | Admitting: *Deleted

## 2022-09-06 NOTE — Telephone Encounter (Signed)
Received VM from pt requesting referral be placed to new oncology team since pt has moved.  RN attempt x1 to return call.  No answer, LVM for pt to return call to the office.

## 2022-09-07 ENCOUNTER — Other Ambulatory Visit: Payer: Self-pay | Admitting: *Deleted

## 2022-09-07 ENCOUNTER — Encounter: Payer: Self-pay | Admitting: Hematology and Oncology

## 2022-09-07 DIAGNOSIS — Z170 Estrogen receptor positive status [ER+]: Secondary | ICD-10-CM

## 2022-09-07 DIAGNOSIS — C50411 Malignant neoplasm of upper-outer quadrant of right female breast: Secondary | ICD-10-CM

## 2022-09-07 NOTE — Progress Notes (Signed)
Per pt request RN successfully faxed [phone]) referral to Dr. Earnest Conroy for transition of breast cancer care due to pt moving.

## 2022-11-22 ENCOUNTER — Ambulatory Visit: Payer: Medicare HMO | Admitting: Hematology and Oncology
